# Patient Record
Sex: Female | Born: 1980
Health system: Southern US, Community
[De-identification: ages and names within clinical notes are randomized; demographics above are authoritative.]

## PROBLEM LIST (undated history)

## (undated) DIAGNOSIS — R7303 Prediabetes: Secondary | ICD-10-CM

## (undated) DIAGNOSIS — F419 Anxiety disorder, unspecified: Secondary | ICD-10-CM

## (undated) DIAGNOSIS — N939 Abnormal uterine and vaginal bleeding, unspecified: Secondary | ICD-10-CM

---

## 2002-02-17 HISTORY — PX: CHOLECYSTECTOMY: SHX55

## 2016-12-29 ENCOUNTER — Other Ambulatory Visit: Payer: Self-pay

## 2016-12-29 ENCOUNTER — Emergency Department (HOSPITAL_COMMUNITY)
Admission: EM | Admit: 2016-12-29 | Discharge: 2016-12-29 | Disposition: A | Discharge status: Home / Self Care | Payer: Self-pay | Attending: Emergency Medicine | Admitting: Emergency Medicine

## 2016-12-29 ENCOUNTER — Encounter (HOSPITAL_COMMUNITY): Payer: Self-pay | Admitting: Emergency Medicine

## 2016-12-29 DIAGNOSIS — N938 Other specified abnormal uterine and vaginal bleeding: Secondary | ICD-10-CM | POA: Insufficient documentation

## 2016-12-29 DIAGNOSIS — F17210 Nicotine dependence, cigarettes, uncomplicated: Secondary | ICD-10-CM | POA: Insufficient documentation

## 2016-12-29 LAB — URINALYSIS, ROUTINE W REFLEX MICROSCOPIC
BILIRUBIN URINE: NEGATIVE
Glucose, UA: NEGATIVE mg/dL
Hgb urine dipstick: NEGATIVE
Ketones, ur: NEGATIVE mg/dL
LEUKOCYTES UA: NEGATIVE
NITRITE: NEGATIVE
Protein, ur: NEGATIVE mg/dL
SPECIFIC GRAVITY, URINE: 1.029 (ref 1.005–1.030)
pH: 6 (ref 5.0–8.0)

## 2016-12-29 LAB — CBC WITH DIFFERENTIAL/PLATELET
Basophils Absolute: 0.1 10*3/uL (ref 0.0–0.1)
Basophils Relative: 1 %
EOS ABS: 0.2 10*3/uL (ref 0.0–0.7)
Eosinophils Relative: 2 %
HCT: 39.6 % (ref 36.0–46.0)
HEMOGLOBIN: 13.2 g/dL (ref 12.0–15.0)
LYMPHS ABS: 3 10*3/uL (ref 0.7–4.0)
LYMPHS PCT: 33 %
MCH: 30.4 pg (ref 26.0–34.0)
MCHC: 33.3 g/dL (ref 30.0–36.0)
MCV: 91.2 fL (ref 78.0–100.0)
Monocytes Absolute: 0.6 10*3/uL (ref 0.1–1.0)
Monocytes Relative: 6 %
NEUTROS ABS: 5.2 10*3/uL (ref 1.7–7.7)
NEUTROS PCT: 58 %
Platelets: 199 10*3/uL (ref 150–400)
RBC: 4.34 MIL/uL (ref 3.87–5.11)
RDW: 13.4 % (ref 11.5–15.5)
WBC: 9 10*3/uL (ref 4.0–10.5)

## 2016-12-29 LAB — WET PREP, GENITAL
CLUE CELLS WET PREP: NONE SEEN
Sperm: NONE SEEN
TRICH WET PREP: NONE SEEN
Yeast Wet Prep HPF POC: NONE SEEN

## 2016-12-29 LAB — PROTIME-INR
INR: 0.96
PROTHROMBIN TIME: 12.7 s (ref 11.4–15.2)

## 2016-12-29 LAB — POC URINE PREG, ED: Preg Test, Ur: NEGATIVE

## 2016-12-29 MED ORDER — KETOROLAC TROMETHAMINE 60 MG/2ML IM SOLN
60.0000 mg | Freq: Once | INTRAMUSCULAR | Status: AC
  Administered 2016-12-29: 60 mg via INTRAMUSCULAR
  Filled 2016-12-29: qty 2

## 2016-12-29 MED ORDER — ONDANSETRON 4 MG PO TBDP: 4.0000 mg | ORAL_TABLET | Freq: Three times a day (TID) | ORAL | 0 refills | Status: DC | PRN

## 2016-12-29 MED ORDER — HYDROCODONE-ACETAMINOPHEN 5-325 MG PO TABS
2.0000 | ORAL_TABLET | Freq: Once | ORAL | Status: AC
  Administered 2016-12-29: 2 via ORAL
  Filled 2016-12-29: qty 2

## 2016-12-29 MED ORDER — NAPROXEN 500 MG PO TABS: 500.0000 mg | ORAL_TABLET | Freq: Two times a day (BID) | ORAL | 0 refills | Status: AC | PRN

## 2016-12-29 MED ORDER — MEDROXYPROGESTERONE ACETATE 10 MG PO TABS: 10.0000 mg | ORAL_TABLET | Freq: Every day | ORAL | 0 refills | Status: DC

## 2016-12-29 NOTE — Discharge Instructions (Signed)
It is very important for you to follow-up with your OB/GYN.  I have given you number to call.  You should have a repeat ultrasound, exam, as well as Pap smear given your family history.

## 2016-12-29 NOTE — ED Triage Notes (Signed)
Pt to ER for evaluation of one month of vaginal bleeding, unsure of LMP, also reports lower back pain associated with this bleeding. VSS. A/o x4. States has taken two tests and is not pregnant. Reports miscarriage in the past

## 2016-12-29 NOTE — ED Provider Notes (Signed)
[facility] DEPART[facility] Provider Note   CSN: [id number] Arrival date & time: 12/29/16  1020     History   Chief Complaint Chief Complaint  Patient presents with  . Vaginal Bleeding  . Back Pain    HPI Mekenna Aerielle Stoklosa is a 36 y.o. female.  HPI    36 year old female with no significant past medical history here with 1 month of vaginal bleeding.  The patient states that her last normal period was several months ago.  Since then, she has had off-and-on vaginal bleeding.  She states she has had a persistent, intermittent dark red vaginal bleeding with passage of occasionally golf ball sized clots.  She has had associated cramp like, lower abdominal and back pain.  This feels similar to the contraction she had when she had her child.  She has had associated mild fatigue, but no chest pain or shortness of breath.  She is not on blood thinners.  She had a history of normal periods prior to this.  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  Her last Pap smear was reportedly several years ago after she was treated for an ectopic pregnancy.  She has not had any issues since then, but has not seen an OB.  She recently moved here from the [place], and has not set up care.   History reviewed. No pertinent past medical history.  There are no active problems to display for this patient.   History reviewed. No pertinent surgical history.  OB History    Gravida Para Term Preterm AB Living   1             SAB TAB Ectopic Multiple Live Births                   Home Medications    Prior to Admission medications   Medication Sig Start Date End Date Taking? Authorizing Provider  acetaminophen (TYLENOL) 500 MG tablet Take 500 mg every 6 (six) hours as needed by mouth for mild pain.   Yes [provider]  medroxyPROGESTERone (PROVERA) 10 MG tablet Take 1 tablet (10 mg total)  daily for 10 days by mouth. 12/29/16 01/08/17  Duffy Bruce, MD  naproxen (NAPROSYN) 500 MG tablet Take 1 tablet (500 mg total) 2 (two) times daily as needed for up to 7 days by mouth for moderate pain. 12/29/16 01/05/17  Duffy Bruce, MD  ondansetron (ZOFRAN ODT) 4 MG disintegrating tablet Take 1 tablet (4 mg total) every 8 (eight) hours as needed by mouth for nausea or vomiting. 12/29/16   Duffy Bruce, MD    Family History History reviewed. No pertinent family history.  Social History Social History   Tobacco Use  . Smoking status: Current Every Day Smoker    Packs/day: 0.50    Types: Cigarettes  . Smokeless tobacco: Never Used  Substance Use Topics  . Alcohol use: Not on file  . Drug use: Not on file     Allergies   Patient has no known allergies.   Review of Systems Review of Systems  Constitutional: Negative for fever.  Gastrointestinal: Positive for abdominal pain.  Genitourinary: Positive for pelvic pain, vaginal bleeding and vaginal pain.  All other systems reviewed and are negative.    Physical Exam Updated Vital Signs BP (!) 145/89 (BP Location: Right Arm)   Pulse (!) 112   Temp 98.5 F (36.9 C) (Oral)  Resp 20   SpO2 100%   Physical Exam  Constitutional: She is oriented to person, place, and time. She appears well-developed and well-nourished. No distress.  HENT:  Head: Normocephalic and atraumatic.  Eyes: Conjunctivae are normal.  Neck: Neck supple.  Cardiovascular: Normal rate, regular rhythm and normal heart sounds. Exam reveals no friction rub.  No murmur heard. Pulmonary/Chest: Effort normal and breath sounds normal. No respiratory distress. She has no wheezes. She has no rales.  Abdominal: Soft. She exhibits no distension. There is tenderness (Mild, suprapubic, no rebound or guarding).  Genitourinary:  Genitourinary Comments: Office is closed, with small amount of dark red venous bleeding.  There is moderate volume dark red blood in  the vaginal vault but no active or bright red bleeding.  Uterus is diffusely tender.  No adnexal pain or fullness.  Musculoskeletal: She exhibits no edema.  Neurological: She is alert and oriented to person, place, and time. She exhibits normal muscle tone.  Skin: Skin is warm. Capillary refill takes less than 2 seconds.  Psychiatric: She has a normal mood and affect.  Nursing note and vitals reviewed.    ED Treatments / Results  Labs (all labs ordered are listed, but only abnormal results are displayed) Labs Reviewed  WET PREP, GENITAL - Abnormal; Notable for the following components:      Result Value   WBC, Wet Prep HPF POC MODERATE (*)    All other components within normal limits  URINALYSIS, ROUTINE W REFLEX MICROSCOPIC - Abnormal; Notable for the following components:   APPearance HAZY (*)    All other components within normal limits  CBC WITH DIFFERENTIAL/PLATELET  PROTIME-INR  POC URINE PREG, ED  GC/CHLAMYDIA PROBE AMP ([place]) NOT AT [facility]    EKG  EKG Interpretation None       Radiology No results found.  Procedures Procedures (including critical care time)  Medications Ordered in ED Medications  ketorolac (TORADOL) injection 60 mg (60 mg Intramuscular Given 12/29/16 1303)  HYDROcodone-acetaminophen (NORCO/VICODIN) 5-325 MG per tablet 2 tablet (2 tablets Oral Given 12/29/16 1303)     Initial Impression / Assessment and Plan / ED Course  I have reviewed the triage vital signs and the nursing notes.  Pertinent labs & imaging results that were available during my care of the patient were reviewed by me and considered in my medical decision making (see chart for details).     36 year old female with past medical history as above here with persistent vaginal bleeding.  On exam, the patient's office is closed and she has no evidence of active arterial bleed.  No adnexal tenderness.  Lab work is very reassuring with normal white count and normal hemoglobin.   She does not have any symptoms to suggest PID or concomitant infection.  I suspect this is secondary to dysfunctional uterine bleeding.  She does have a family history of cervical disease, however.  I see no gross abnormalities on my visual exam today, but will start her on a trial of progesterone OCPs for dysfunctional bleeding and I have discussed the importance of following up with an OB/GYN for repeat exam, Pap smear, and ultrasound as an outpatient.  The patient is in agreement with this plan.  This note was prepared with assistance of Systems analyst. Occasional wrong-word or sound-a-like substitutions may have occurred due to the inherent limitations of voice recognition software.   Final Clinical Impressions(s) / ED Diagnoses   Final diagnoses:  DUB (dysfunctional uterine bleeding)  ED Discharge Orders        Ordered    medroxyPROGESTERone (PROVERA) 10 MG tablet  Daily     12/29/16 1410    naproxen (NAPROSYN) 500 MG tablet  2 times daily PRN     12/29/16 1410    ondansetron (ZOFRAN ODT) 4 MG disintegrating tablet  Every 8 hours PRN     12/29/16 1410       Duffy Bruce, MD 12/29/16 1420

## 2016-12-30 LAB — GC/CHLAMYDIA PROBE AMP (~~LOC~~) NOT AT ARMC
CHLAMYDIA, DNA PROBE: NEGATIVE
NEISSERIA GONORRHEA: NEGATIVE

## 2017-01-10 ENCOUNTER — Encounter (HOSPITAL_COMMUNITY): Payer: Self-pay

## 2017-01-10 ENCOUNTER — Other Ambulatory Visit: Payer: Self-pay

## 2017-01-10 ENCOUNTER — Emergency Department (HOSPITAL_COMMUNITY): Payer: Self-pay

## 2017-01-10 ENCOUNTER — Emergency Department (HOSPITAL_COMMUNITY)
Admission: EM | Admit: 2017-01-10 | Discharge: 2017-01-10 | Disposition: A | Discharge status: Home / Self Care | Payer: Self-pay | Attending: Emergency Medicine | Admitting: Emergency Medicine

## 2017-01-10 DIAGNOSIS — N938 Other specified abnormal uterine and vaginal bleeding: Secondary | ICD-10-CM | POA: Insufficient documentation

## 2017-01-10 DIAGNOSIS — F17210 Nicotine dependence, cigarettes, uncomplicated: Secondary | ICD-10-CM | POA: Insufficient documentation

## 2017-01-10 DIAGNOSIS — N939 Abnormal uterine and vaginal bleeding, unspecified: Secondary | ICD-10-CM

## 2017-01-10 DIAGNOSIS — R1030 Lower abdominal pain, unspecified: Secondary | ICD-10-CM | POA: Insufficient documentation

## 2017-01-10 LAB — CBC WITH DIFFERENTIAL/PLATELET
Basophils Absolute: 0.1 10*3/uL (ref 0.0–0.1)
Basophils Relative: 1 %
Eosinophils Absolute: 0.3 10*3/uL (ref 0.0–0.7)
Eosinophils Relative: 3 %
HCT: 39.5 % (ref 36.0–46.0)
Hemoglobin: 12.9 g/dL (ref 12.0–15.0)
Lymphocytes Relative: 31 %
Lymphs Abs: 2.4 10*3/uL (ref 0.7–4.0)
MCH: 29.9 pg (ref 26.0–34.0)
MCHC: 32.7 g/dL (ref 30.0–36.0)
MCV: 91.4 fL (ref 78.0–100.0)
Monocytes Absolute: 0.5 10*3/uL (ref 0.1–1.0)
Monocytes Relative: 6 %
Neutro Abs: 4.5 10*3/uL (ref 1.7–7.7)
Neutrophils Relative %: 59 %
Platelets: 215 10*3/uL (ref 150–400)
RBC: 4.32 MIL/uL (ref 3.87–5.11)
RDW: 13.2 % (ref 11.5–15.5)
WBC: 7.6 10*3/uL (ref 4.0–10.5)

## 2017-01-10 LAB — COMPREHENSIVE METABOLIC PANEL
ALT: 49 U/L (ref 14–54)
AST: 42 U/L — ABNORMAL HIGH (ref 15–41)
Albumin: 3.9 g/dL (ref 3.5–5.0)
Alkaline Phosphatase: 50 U/L (ref 38–126)
Anion gap: 7 (ref 5–15)
BUN: 9 mg/dL (ref 6–20)
CO2: 26 mmol/L (ref 22–32)
Calcium: 9 mg/dL (ref 8.9–10.3)
Chloride: 106 mmol/L (ref 101–111)
Creatinine, Ser: 0.74 mg/dL (ref 0.44–1.00)
GFR calc Af Amer: >60 mL/min (ref >60)
GFR calc non Af Amer: >60 mL/min (ref >60)
Glucose, Bld: 112 mg/dL — ABNORMAL HIGH (ref 65–99)
Potassium: 4.3 mmol/L (ref 3.5–5.1)
Sodium: 139 mmol/L (ref 135–145)
Total Bilirubin: 0.5 mg/dL (ref 0.3–1.2)
Total Protein: 6.7 g/dL (ref 6.5–8.1)

## 2017-01-10 LAB — I-STAT BETA HCG BLOOD, ED (MC, WL, AP ONLY): I-stat hCG, quantitative: <5 m[IU]/mL (ref <5)

## 2017-01-10 MED ORDER — IBUPROFEN 200 MG PO TABS
600.0000 mg | ORAL_TABLET | Freq: Once | ORAL | Status: AC
  Administered 2017-01-10: 12:00:00 600 mg via ORAL
  Filled 2017-01-10: qty 1

## 2017-01-10 MED ORDER — MEGESTROL ACETATE 40 MG PO TABS: 40.0000 mg | ORAL_TABLET | Freq: Every day | ORAL | 0 refills | Status: DC

## 2017-01-10 NOTE — ED Notes (Signed)
Patient transported to Ultrasound 

## 2017-01-10 NOTE — ED Triage Notes (Signed)
Patient states that she developed recurrent heavy vaginal bleeding during the night, took meds as prescribed and finished the RX 2 days ago, bleeding with cramping since middle of night

## 2017-01-10 NOTE — ED Provider Notes (Signed)
[facility] EMERGENCY DEPARTMENT Provider Note   CSN: [id number] Arrival date & time: 01/10/17  0630     History   Chief Complaint No chief complaint on file.   HPI Heidi Bartlett is a 36 y.o. female.  HPI   36 year old female presents today with complaints of vaginal bleeding.  Patient notes that she had normal menstrual cycles monthly up until several months ago.  She notes since then she has had on and off vaginal bleeding.  She notes the first major bleed started as a usual menstrual cycle but lasted approximately 2 weeks with heavy bleeding.  She notes this resolved on its own.  The next bleed started at the beginning of the months when she usually does not have her cycle.  She notes that after this episode of bleeding started she came to the emergency room.  She was evaluated in the emergency room and discharged home on progesterone.  She notes that she was taking the progesterone which seemed to improve the bleeding but did not completely stop it.  She notes 2 days ago was her last dose of this and woke up in the middle the night last night with severe bleeding.  She notes she is using one tampon every 10 minutes.  She notes that she did not reach out to OB/GYN as she does not feel that they will take her because she does not have insurance and has out-of-state Medicaid.  Patient reports that she has been sexually active since her last visit here to the ED.  She reports bilateral lower abdominal cramping.  She denies any fever.  She denies any vaginal discharge.      History reviewed. No pertinent past medical history.  There are no active problems to display for this patient.   History reviewed. No pertinent surgical history.  OB History    Gravida Para Term Preterm AB Living   1             SAB TAB Ectopic Multiple Live Births                   Home Medications    Prior to Admission medications   Medication Sig Start Date End Date Taking?  Authorizing Provider  acetaminophen (TYLENOL) 500 MG tablet Take 500 mg every 6 (six) hours as needed by mouth for mild pain.   Yes [provider]  medroxyPROGESTERone (PROVERA) 10 MG tablet Take 1 tablet (10 mg total) daily for 10 days by mouth. 12/29/16 01/10/17 Yes Duffy Bruce, MD  naproxen (NAPROSYN) 500 MG tablet Take 500 mg by mouth 2 (two) times daily with a meal.   Yes [provider]  megestrol (MEGACE) 40 MG tablet Take 1 tablet (40 mg total) by mouth daily. Please take tab one three times per day for three days, two times daily for 3 days, and 1 tab daily for 10 days 01/10/17   Masoud Nyce, Dellis Filbert, PA-C  ondansetron (ZOFRAN ODT) 4 MG disintegrating tablet Take 1 tablet (4 mg total) every 8 (eight) hours as needed by mouth for nausea or vomiting. Patient not taking: Reported on 01/10/2017 12/29/16   Duffy Bruce, MD    Family History No family history on file.  Social History Social History   Tobacco Use  . Smoking status: Current Every Day Smoker    Packs/day: 0.50    Types: Cigarettes  . Smokeless tobacco: Never Used  Substance Use Topics  . Alcohol use: Not on file  .  Drug use: Not on file     Allergies   Patient has no known allergies.   Review of Systems Review of Systems  All other systems reviewed and are negative.    Physical Exam Updated Vital Signs BP 136/83   Pulse 71   Temp 98.6 F (37 C) (Oral)   Resp 16   SpO2 98%   Breastfeeding? Unknown   Physical Exam  Constitutional: She is oriented to person, place, and time. She appears well-developed and well-nourished.  HENT:  Head: Normocephalic and atraumatic.  Eyes: Conjunctivae are normal. Pupils are equal, round, and reactive to light. Right eye exhibits no discharge. Left eye exhibits no discharge. No scleral icterus.  Neck: Normal range of motion. No JVD present. No tracheal deviation present.  Pulmonary/Chest: Effort normal. No stridor.  Abdominal:  Minor tenderness to  bilateral lower regions-mid and upper abdomen nontender to palpation  Genitourinary:  Genitourinary Comments: Copious dark blood and small clots in vaginal vault - cervical os closed without abnormality - no obvious discharge   Neurological: She is alert and oriented to person, place, and time. Coordination normal.  Psychiatric: She has a normal mood and affect. Her behavior is normal. Judgment and thought content normal.  Nursing note and vitals reviewed.    ED Treatments / Results  Labs (all labs ordered are listed, but only abnormal results are displayed) Labs Reviewed  COMPREHENSIVE METABOLIC PANEL - Abnormal; Notable for the following components:      Result Value   Glucose, Bld 112 (*)    AST 42 (*)    All other components within normal limits  CBC WITH DIFFERENTIAL/PLATELET  I-STAT BETA HCG BLOOD, ED ([facility], [facility], [facility] ONLY)    EKG  EKG Interpretation None       Radiology US Pelvic Complete With Transvaginal  Result Date: 01/10/2017 CLINICAL DATA:  Recurrent heavy vaginal bleeding. EXAM: TRANSABDOMINAL AND TRANSVAGINAL ULTRASOUND OF PELVIS TECHNIQUE: Both transabdominal and transvaginal ultrasound examinations of the pelvis were performed. Transabdominal technique was performed for global imaging of the pelvis including uterus, ovaries, adnexal regions, and pelvic cul-de-sac. It was necessary to proceed with endovaginal exam following the transabdominal exam to visualize the bilateral ovaries. COMPARISON:  None FINDINGS: Uterus Measurements: 8.8 x 4.4 x 4.5 cm. Multiple small nabothian cysts. Uterus is heterogeneous without discrete fibroids. Endometrium Thickness: 0.9 cm.  No focal abnormality visualized. Right ovary Measurements: 5.4 x 2.7 x 4.1 cm. Anechoic cyst in the right ovary that measures 3.5 x 2.7 x 3.3 cm. Left ovary Measurements: 4.9 x 3.0 x 4.4 cm. Anechoic cyst in left ovary measuring 4.6 x 2.0 x 3.5 cm. There is an additional small cyst or follicle measuring up to 2.0  cm. Other findings Trace free fluid. IMPRESSION: Uterus is heterogeneous without discrete fibroids. Bilateral ovarian cysts. Electronically Signed   By: Markus Daft M.D.   On: 01/10/2017 14:20    Procedures Procedures (including critical care time)  Medications Ordered in ED Medications  ibuprofen (ADVIL,MOTRIN) tablet 600 mg (600 mg Oral Given 01/10/17 1158)     Initial Impression / Assessment and Plan / ED Course  I have reviewed the triage vital signs and the nursing notes.  Pertinent labs & imaging results that were available during my care of the patient were reviewed by me and considered in my medical decision making (see chart for details).       Final Clinical Impressions(s) / ED Diagnoses   Final diagnoses:  Vaginal bleeding  Dysfunctional uterine bleeding  Labs: I stat beta hcg, cbc, cmp  Imaging: OB ultrasound,   Consults:  Therapeutics: Ibuprofen  Discharge Meds: megace  Assessment/Plan:   36 year old female presents today with dysfunctional uterine bleeding.  This is her second visit for the same complaint.  Patient had an ultrasound here that showed no significant findings.  Physical exam shows bleeding with no other abnormality.  Patient will be started on Megace, she will follow-up with OB/GYN, I have given her follow-up information for the [facility].  She will return for any new or worsening signs or symptoms.  Patient's workup here is reassuring, she has no signs of significant blood loss on her CBC, no signs of tachycardia or unstable vital signs.  Patient given strict return precautions, verbalized understanding and agreement to today's    ED Discharge Orders        Ordered    megestrol (MEGACE) 40 MG tablet  Daily     01/10/17 1519       Okey Regal, PA-C 01/10/17 1643    Virgel Manifold, MD 01/11/17 1140

## 2017-01-10 NOTE — ED Notes (Addendum)
Patient returned from Ultrasound. 

## 2017-01-10 NOTE — Discharge Instructions (Signed)
Please read attached information. If you experience any new or worsening signs or symptoms please return to the emergency room for evaluation. Please follow-up with your primary care provider or specialist as discussed. Please use medication prescribed only as directed and discontinue taking if you have any concerning signs or symptoms.   °

## 2017-11-02 ENCOUNTER — Ambulatory Visit: Payer: Self-pay | Admitting: Internal Medicine

## 2017-11-24 ENCOUNTER — Ambulatory Visit (HOSPITAL_COMMUNITY)
Admission: RE | Admit: 2017-11-24 | Discharge: 2017-11-24 | Disposition: A | Discharge status: Home / Self Care | Payer: Self-pay | Source: Ambulatory Visit | Attending: Internal Medicine | Admitting: Internal Medicine

## 2017-11-24 ENCOUNTER — Ambulatory Visit: Payer: Self-pay | Attending: Internal Medicine | Admitting: Internal Medicine

## 2017-11-24 ENCOUNTER — Encounter: Payer: Self-pay | Admitting: Internal Medicine

## 2017-11-24 VITALS — BP 123/81 | HR 78 | Temp 99.3°F | Resp 16 | Ht 66.5 in | Wt 247.2 lb

## 2017-11-24 DIAGNOSIS — E6609 Other obesity due to excess calories: Secondary | ICD-10-CM | POA: Insufficient documentation

## 2017-11-24 DIAGNOSIS — Z9049 Acquired absence of other specified parts of digestive tract: Secondary | ICD-10-CM | POA: Insufficient documentation

## 2017-11-24 DIAGNOSIS — Z79899 Other long term (current) drug therapy: Secondary | ICD-10-CM | POA: Insufficient documentation

## 2017-11-24 DIAGNOSIS — M5431 Sciatica, right side: Secondary | ICD-10-CM | POA: Insufficient documentation

## 2017-11-24 DIAGNOSIS — Z6839 Body mass index (BMI) 39.0-39.9, adult: Secondary | ICD-10-CM | POA: Insufficient documentation

## 2017-11-24 DIAGNOSIS — F17200 Nicotine dependence, unspecified, uncomplicated: Secondary | ICD-10-CM | POA: Insufficient documentation

## 2017-11-24 DIAGNOSIS — F17210 Nicotine dependence, cigarettes, uncomplicated: Secondary | ICD-10-CM | POA: Insufficient documentation

## 2017-11-24 DIAGNOSIS — J069 Acute upper respiratory infection, unspecified: Secondary | ICD-10-CM | POA: Insufficient documentation

## 2017-11-24 MED ORDER — TETANUS-DIPHTH-ACELL PERTUSSIS 5-2.5-18.5 LF-MCG/0.5 IM SUSP: 0.5000 mL | Freq: Once | INTRAMUSCULAR | 0 refills | Status: AC

## 2017-11-24 MED ORDER — NAPROXEN 500 MG PO TABS: 500.0000 mg | ORAL_TABLET | Freq: Two times a day (BID) | ORAL | 1 refills | Status: DC | PRN

## 2017-11-24 MED ORDER — METHOCARBAMOL 500 MG PO TABS: 500.0000 mg | ORAL_TABLET | Freq: Two times a day (BID) | ORAL | 0 refills | Status: DC | PRN

## 2017-11-24 MED FILL — METHOCARBAMOL 500 MG TABS: 500 | 15 days supply | Qty: 30 | Fill #0

## 2017-11-24 MED FILL — NAPROXEN 500 MG TABLET: 500 | 30 days supply | Qty: 60 | Fill #0
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
## 2017-11-24 NOTE — Progress Notes (Signed)
Pt states her back usually bothers her at night  Pt states the pain goes down both hips

## 2017-11-24 NOTE — Patient Instructions (Addendum)
Follow a Healthy Eating Plan - You can do it! Limit sugary drinks.  Avoid sodas, sweet tea, sport or energy drinks, or fruit drinks.  Drink water, lo-fat milk, or diet drinks. Limit snack foods.   Cut back on candy, cake, cookies, chips, ice cream.  These are a special treat, only in small amounts. Eat plenty of vegetables.  Especially dark green, red, and orange vegetables. Aim for at least 3 servings a day. More is better! Include fruit in your daily diet.  Whole fruit is much healthier than fruit juice! Limit "white" bread, "white" pasta, "white" rice.   Choose "100% whole grain" products, brown or wild rice. Avoid fatty meats. Try "Meatless Monday" and choose eggs or beans one day a week.  When eating meat, choose lean meats like chicken, [place], and fish.  Grill, broil, or bake meats instead of frying, and eat poultry without the skin. Eat less salt.  Avoid frozen pizzas, frozen dinners and salty foods.  Use seasonings other than salt in cooking.  This can help blood pressure and keep you from swelling Beer, wine and liquor have calories.  If you can safely drink alcohol, limit to 1 drink per day for women, 2 drinks for men   Td Vaccine (Tetanus and Diphtheria): What You Need to Know 1. Why get vaccinated? Tetanus  and diphtheria are very serious diseases. They are rare in the [place] today, but people who do become infected often have severe complications. Td vaccine is used to protect adolescents and adults from both of these diseases. Both tetanus and diphtheria are infections caused by bacteria. Diphtheria spreads from person to person through coughing or sneezing. Tetanus-causing bacteria enter the body through cuts, scratches, or wounds. TETANUS (lockjaw) causes painful muscle tightening and stiffness, usually all over the body.  It can lead to tightening of muscles in the head and neck so you can't open your mouth, swallow, or sometimes even breathe. Tetanus kills about 1 out of  every 10 people who are infected even after receiving the best medical care.  DIPHTHERIA can cause a thick coating to form in the back of the throat.  It can lead to breathing problems, paralysis, heart failure, and death.  Before vaccines, as many as 200,000 cases of diphtheria and hundreds of cases of tetanus were reported in the [place] each year. Since vaccination began, reports of cases for both diseases have dropped by about 99%. 2. Td vaccine Td vaccine can protect adolescents and adults from tetanus and diphtheria. Td is usually given as a booster dose every 10 years but it can also be given earlier after a severe and dirty wound or burn. Another vaccine, called Tdap, which protects against pertussis in addition to tetanus and diphtheria, is sometimes recommended instead of Td vaccine. Your doctor or the person giving you the vaccine can give you more information. Td may safely be given at the same time as other vaccines. 3. Some people should not get this vaccine  A person who has ever had a life-threatening allergic reaction after a previous dose of any tetanus or diphtheria containing vaccine, OR has a severe allergy to any part of this vaccine, should not get Td vaccine. Tell the person giving the vaccine about any severe allergies.  Talk to your doctor if you: ? had severe pain or swelling after any vaccine containing diphtheria or tetanus, ? ever had a condition called Guillain Barre Syndrome (GBS), ? aren't feeling well on the day the shot  is scheduled. 4. What are the risks from Td vaccine? With any medicine, including vaccines, there is a chance of side effects. These are usually mild and go away on their own. Serious reactions are also possible but are rare. Most people who get Td vaccine do not have any problems with it. Mild problems following Td vaccine: (Did not interfere with activities)  Pain where the shot was given (about 8 people in 10)  Redness or  swelling where the shot was given (about 1 person in 4)  Mild fever (rare)  Headache (about 1 person in 4)  Tiredness (about 1 person in 4)  Moderate problems following Td vaccine: (Interfered with activities, but did not require medical attention)  Fever over 102F (rare)  Severe problems following Td vaccine: (Unable to perform usual activities; required medical attention)  Swelling, severe pain, bleeding and/or redness in the arm where the shot was given (rare).  Problems that could happen after any vaccine:  People sometimes faint after a medical procedure, including vaccination. Sitting or lying down for about 15 minutes can help prevent fainting, and injuries caused by a fall. Tell your doctor if you feel dizzy, or have vision changes or ringing in the ears.  Some people get severe pain in the shoulder and have difficulty moving the arm where a shot was given. This happens very rarely.  Any medication can cause a severe allergic reaction. Such reactions from a vaccine are very rare, estimated at fewer than 1 in a million doses, and would happen within a few minutes to a few hours after the vaccination. As with any medicine, there is a very remote chance of a vaccine causing a serious injury or death. The safety of vaccines is always being monitored. For more information, visit: http://www.aguilar.org/ 5. What if there is a serious reaction? What should I look for? Look for anything that concerns you, such as signs of a severe allergic reaction, very high fever, or unusual behavior. Signs of a severe allergic reaction can include hives, swelling of the face and throat, difficulty breathing, a fast heartbeat, dizziness, and weakness. These would usually start a few minutes to a few hours after the vaccination. What should I do?  If you think it is a severe allergic reaction or other emergency that can't wait, call 9-1-1 or get the person to the nearest hospital. Otherwise,  call your doctor.  Afterward, the reaction should be reported to the Vaccine Adverse Event Reporting System (VAERS). Your doctor might file this report, or you can do it yourself through the VAERS web site at www.vaers.SamedayNews.es, or by calling 708-269-4235. ? VAERS does not give medical advice. 6. The National Vaccine Injury Compensation Program The [employer] Vaccine Injury Compensation Program (VICP) is a federal program that was created to compensate people who may have been injured by certain vaccines. Persons who believe they may have been injured by a vaccine can learn about the program and about filing a claim by calling (336)213-9133 or visiting the [place] website at GoldCloset.com.ee. There is a time limit to file a claim for compensation. 7. How can I learn more?  Ask your doctor. He or she can give you the vaccine package insert or suggest other sources of information.  Call your local or state health department.  Contact the Centers for Disease Control and Prevention (CDC): ? Call 314-621-5723 (1-800-CDC-INFO) ? Visit CDC's website at http://hunter.com/ CDC Td Vaccine VIS (05/29/15) This information is not intended to replace advice given to you  by your health care provider. Make sure you discuss any questions you have with your health care provider. Document Released: 12/01/2005 Document Revised: 10/25/2015 Document Reviewed: 10/25/2015 Elsevier Interactive Patient Education  2017 [employer].

## 2017-11-25 ENCOUNTER — Telehealth: Payer: Self-pay

## 2017-11-25 NOTE — Telephone Encounter (Signed)
Contacted pt to go over xray results pt is aware and doesn't have any questions or concerns  

## 2017-12-08 ENCOUNTER — Ambulatory Visit: Payer: Self-pay | Attending: Internal Medicine

## 2017-12-22 ENCOUNTER — Telehealth: Payer: Self-pay | Admitting: Internal Medicine

## 2017-12-22 DIAGNOSIS — M5431 Sciatica, right side: Secondary | ICD-10-CM

## 2017-12-22 NOTE — Telephone Encounter (Signed)
Patient has cafa

## 2017-12-22 NOTE — Telephone Encounter (Signed)
Patient has CAFA

## 2017-12-22 NOTE — Telephone Encounter (Signed)
Patient needs a referral for a back doctor please fu with the patient

## 2018-01-12 ENCOUNTER — Encounter (INDEPENDENT_AMBULATORY_CARE_PROVIDER_SITE_OTHER): Payer: Self-pay | Admitting: Orthopaedic Surgery

## 2018-01-12 ENCOUNTER — Ambulatory Visit (INDEPENDENT_AMBULATORY_CARE_PROVIDER_SITE_OTHER): Payer: Self-pay

## 2018-01-12 ENCOUNTER — Ambulatory Visit (INDEPENDENT_AMBULATORY_CARE_PROVIDER_SITE_OTHER): Payer: Self-pay | Admitting: Orthopaedic Surgery

## 2018-01-12 VITALS — BP 129/84 | HR 96 | Ht 67.0 in | Wt 247.0 lb

## 2018-01-12 DIAGNOSIS — M545 Low back pain, unspecified: Secondary | ICD-10-CM

## 2018-01-12 DIAGNOSIS — G8929 Other chronic pain: Secondary | ICD-10-CM

## 2018-01-12 NOTE — Progress Notes (Signed)
Office Visit Note/orthopedic consultation requested by Dr. Wynetta Emery   Patient: Heidi Bartlett           Date of Birth: 03-Feb-1981           MRN: [id number] Visit Date: 01/12/2018              Requested by: Ladell Pier, MD [address] [place], [place] [address] PCP: Ladell Pier, MD   Assessment & Plan: Visit Diagnoses:  1. Chronic bilateral low back pain, unspecified whether sciatica present     Plan: We discussed core strengthening working on weight loss.  She is gained more than 90 pounds since she moved to [place] and should get improvement in her back symptoms with weight loss and core strengthening.  If she is having persistent problems after this she can return.  We discussed that at some point she might require instrumented fusion for stabilization.  Thank you the opportunity to see her in consultation  Follow-Up Instructions: Return if symptoms worsen or fail to improve.   Orders:  Orders Placed This Encounter  Procedures  . XR Lumb Spine Flex&Ext Only   No orders of the defined types were placed in this encounter.     Procedures: No procedures performed   Clinical Data: No additional findings.   Subjective: Chief Complaint  Patient presents with  . Lower Back - Pain    HPI Orthopedic Consultation requested by Dr. Wynetta Emery.  37 year old female who is here with an advocate ,states she is had chronic low back pain for years.  She has pain in her back that radiates to both hips and has off-and-on numbness in the lateral right calf.  Pain tends to be worse at night.  She had some Robaxin that gave some relief she did not get relief with Naprosyn.  She is noticed some constipation with Naprosyn 500 mg twice daily.  No associated bowel or bladder symptoms with her back pain.  She states she is to weigh 155 and over time since she moved from [place] she is gained 95 pounds.  X-rays obtained on Review of Systems point review of systems positive for  tobacco dependence cigarette smoker, 2 diabetes BMI 38.6.   Objective: Vital Signs: BP 129/84   Pulse 96   Ht 5\' 7"  (1.702 m)   Wt 247 lb (112 kg)   BMI 38.69 kg/m   Physical Exam  Constitutional: She is oriented to person, place, and time. She appears well-developed.  HENT:  Head: Normocephalic.  Right Ear: External ear normal.  Left Ear: External ear normal.  Eyes: Pupils are equal, round, and reactive to light.  Neck: No tracheal deviation present. No thyromegaly present.  Cardiovascular: Normal rate.  Pulmonary/Chest: Effort normal.  Abdominal: Soft.  Neurological: She is alert and oriented to person, place, and time.  Skin: Skin is warm and dry.  Psychiatric: She has a normal mood and affect. Her behavior is normal.    Ortho Exam patient not able to do a sit up.  She has intact anterior tib gastrocsoleus able heel and toe walk reflexes are 2+ and symmetrical no isolated motor weakness no atrophy.  She has some sciatic notch tenderness negative logroll to the hips.  SI joint testing is negative.  Specialty Comments:  No specialty comments available.  Imaging: CLINICAL DATA:  Chronic sciatica  EXAM: LUMBAR SPINE - COMPLETE 4+ VIEW  COMPARISON:  None.  FINDINGS: Frontal, lateral, spot lumbosacral lateral, and bilateral oblique views were obtained.  There are 5 non-rib-bearing lumbar type vertebral bodies. There is no evident fracture. There is 12 mm of anterolisthesis of L5 on S1 with apparent pars defects at L5 bilaterally. No other spondylolisthesis is evident. There is severe disc space narrowing at L4-5 and L5-S1. Other disc spaces appear unremarkable. There is facet osteoarthritic change at L5-S1 bilaterally.  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 Severe disc space narrowing noted at L4-5 and L5-S1. Other disc spaces appear normal. No fracture. Facet arthropathy noted at L5-S1  bilaterally.   Electronically Signed   By: Lowella Grip III M.D.   On: 11/24/2017 15:33   PMFS History: Patient Active Problem List   Diagnosis Date Noted  . Sciatica of right side 11/24/2017  . Class 2 obesity due to excess calories without serious comorbidity with body mass index (BMI) of 39.0 to 39.9 in adult 11/24/2017  . Tobacco dependence 11/24/2017   No past medical history on file.  No family history on file.  Past Surgical History:  Procedure Laterality Date  . CESAREAN SECTION  2004  . CHOLECYSTECTOMY  2004   Social History   Occupational History  . Occupation: unemployed  Tobacco Use  . Smoking status: Current Every Day Smoker    Packs/day: 0.50    Types: Cigarettes  . Smokeless tobacco: Never Used  Substance and Sexual Activity  . Alcohol use: Not Currently  . Drug use: Not Currently  . Sexual activity: Not on file

## 2018-06-22 ENCOUNTER — Telehealth: Payer: Self-pay

## 2018-06-22 NOTE — Telephone Encounter (Signed)
Message received from [facility], RN/[employer] requesting follow up appointment for patient at [facility]. Informed her that appointment has been scheduled for 07/02/2018 @ 1410. Provider to determine if in person or televisit.

## 2018-07-02 ENCOUNTER — Ambulatory Visit: Payer: Self-pay | Attending: Internal Medicine | Admitting: Internal Medicine

## 2018-07-02 ENCOUNTER — Other Ambulatory Visit: Payer: Self-pay

## 2018-07-02 DIAGNOSIS — M545 Low back pain, unspecified: Secondary | ICD-10-CM

## 2018-07-02 DIAGNOSIS — N938 Other specified abnormal uterine and vaginal bleeding: Secondary | ICD-10-CM

## 2018-07-02 DIAGNOSIS — E669 Obesity, unspecified: Secondary | ICD-10-CM

## 2018-07-02 DIAGNOSIS — G8929 Other chronic pain: Secondary | ICD-10-CM

## 2018-07-02 NOTE — Progress Notes (Signed)
Virtual Visit via Telephone Note Due to current restrictions/limitations of in-office visits due to the COVID-19 pandemic, this scheduled clinical appointment was converted to a telehealth visit  I connected with Heidi Bartlett on 07/02/18 at 1:55 p.m by telephone and verified that I am speaking with the correct person using two identifiers. I am in my office.  The patient is at home.  Only the patient and myself participated in this encounter.  I discussed the limitations, risks, security and privacy concerns of performing an evaluation and management service by telephone and the availability of in person appointments. I also discussed with the patient that there may be a patient responsible charge related to this service. The patient expressed understanding and agreed to proceed.   History of Present Illness: History of obesity, tobacco dependence, chronic right-sided lower back pain.  Last seen in 11/2018  Loss 47 lbs since last seen 11/2018 Walking more; stopped drinking sodas and Monster drinks.  Drinking more water.  She has cut back significantly on eating bread and junk foods -However she reports that despite the weight loss she still gets her lower back pain and right hip pain.  She had seen the orthopedics Dr. Lorin Mercy back in November and he stressed the importance of weight loss.  He told her that at some point she may require instrumented fusion for stabilization of her lumbar spine.  Pt c/o episode of prolong vaginal bleeding this past mth. Started on time 05/19/2018 and bled the entire mth.  Bleeding moderate to heavy with clots.  + cramps. Did a home preg test that was neg.   Stopped 1.5 wks ago. Had similar episode in 2018.  Did not see a gyn. Seen in ED and had pelvic US that was negative for fibroids -not on any method of BC  -last pap was over 5 yrs ago    Observations/Objective: No direct observation done as this was a telephone encounter  Assessment and Plan: 1.  Dysfunctional uterine bleeding -We will have patient come in for pelvic exam and Pap smear within the next 2 to 3 weeks.  Will check hormone levels including TSH on that visit.  Until then I have asked her to keep a calendar if she has any further bleeding.  2. Obesity (BMI 30-39.9) Commended her on weight loss.  Encouraged her to keep up the good work with healthy eating and regular exercise.  3. Chronic right-sided low back pain, unspecified whether sciatica present Hopefully this will start to get better with continued weight loss.   Follow Up Instructions: 2-3 wks in person   I discussed the assessment and treatment plan with the patient. The patient was provided an opportunity to ask questions and all were answered. The patient agreed with the plan and demonstrated an understanding of the instructions.   The patient was advised to call back or seek an in-person evaluation if the symptoms worsen or if the condition fails to improve as anticipated.  I provided 15 minutes of non-face-to-face time during this encounter.   Karle Plumber, MD

## 2018-07-15 ENCOUNTER — Telehealth: Payer: Self-pay

## 2018-07-15 NOTE — Telephone Encounter (Signed)
Message received from [facility], [place]. She said that the patient is inquiring about the status of a referral to OB/GYN. Informed her that PCP would be notified.

## 2018-07-16 NOTE — Telephone Encounter (Signed)
Message sent to [facility], RN [place] her that patient has been scheduled for an appointment with Dr Wynetta Emery 07/22/2018 @ 1510 and requesting Dawn inform the patient that she will be contacted the day prior to the appointment to confirm

## 2018-07-22 ENCOUNTER — Ambulatory Visit: Payer: Self-pay | Admitting: Internal Medicine

## 2018-07-27 ENCOUNTER — Ambulatory Visit: Payer: Self-pay | Admitting: Internal Medicine

## 2018-08-02 ENCOUNTER — Ambulatory Visit: Payer: Self-pay | Admitting: Internal Medicine

## 2018-11-25 ENCOUNTER — Other Ambulatory Visit: Payer: Self-pay

## 2018-11-25 DIAGNOSIS — Z20822 Contact with and (suspected) exposure to covid-19: Secondary | ICD-10-CM

## 2018-11-26 LAB — NOVEL CORONAVIRUS, NAA: SARS-CoV-2, NAA: NOT DETECTED

## 2019-05-03 ENCOUNTER — Ambulatory Visit: Payer: Self-pay | Admitting: Family Medicine

## 2019-05-03 ENCOUNTER — Other Ambulatory Visit: Payer: Self-pay

## 2019-05-03 ENCOUNTER — Other Ambulatory Visit (HOSPITAL_COMMUNITY)
Admission: RE | Admit: 2019-05-03 | Discharge: 2019-05-03 | Disposition: A | Discharge status: Home / Self Care | Payer: Self-pay | Source: Ambulatory Visit | Attending: Family Medicine | Admitting: Family Medicine

## 2019-05-03 VITALS — BP 123/80 | HR 88 | Temp 98.2°F | Resp 18 | Ht 64.0 in

## 2019-05-03 DIAGNOSIS — M5441 Lumbago with sciatica, right side: Secondary | ICD-10-CM

## 2019-05-03 DIAGNOSIS — Z114 Encounter for screening for human immunodeficiency virus [HIV]: Secondary | ICD-10-CM

## 2019-05-03 DIAGNOSIS — M5442 Lumbago with sciatica, left side: Secondary | ICD-10-CM

## 2019-05-03 DIAGNOSIS — Z124 Encounter for screening for malignant neoplasm of cervix: Secondary | ICD-10-CM

## 2019-05-03 DIAGNOSIS — G8929 Other chronic pain: Secondary | ICD-10-CM

## 2019-05-03 DIAGNOSIS — F17200 Nicotine dependence, unspecified, uncomplicated: Secondary | ICD-10-CM

## 2019-05-03 DIAGNOSIS — Z01419 Encounter for gynecological examination (general) (routine) without abnormal findings: Secondary | ICD-10-CM

## 2019-05-03 DIAGNOSIS — N938 Other specified abnormal uterine and vaginal bleeding: Secondary | ICD-10-CM

## 2019-05-03 LAB — POCT URINE PREGNANCY: Preg Test, Ur: NEGATIVE

## 2019-05-03 MED ORDER — MELOXICAM 15 MG PO TABS: 7.5000 mg | ORAL_TABLET | Freq: Every day | ORAL | 2 refills | Status: DC | PRN

## 2019-05-03 MED ORDER — CYCLOBENZAPRINE HCL 10 MG PO TABS: 10.0000 mg | ORAL_TABLET | Freq: Two times a day (BID) | ORAL | 0 refills | Status: DC | PRN

## 2019-05-03 NOTE — Patient Instructions (Addendum)
You will be notified of all labs results via [place] within 5-7 business days.  We are scheduling you a follow-up at one our clinics with one of our providers and and an appointment with financial counselor in order for you to apply for financial assistance and resume.  Take meloxicam and cyclobenzaprine as prescribed. I have placed your referral to OBGYN in order to secure you an appointment as it can take several weeks before they are able to schedule you. Please inform the scheduler that you are applying for financial assistance.      Dysfunctional Uterine Bleeding Dysfunctional uterine bleeding is abnormal bleeding from the uterus. Dysfunctional uterine bleeding includes:  A menstrual period that comes earlier or later than usual.  A menstrual period that is lighter or heavier than usual, or has large blood clots.  Vaginal bleeding between menstrual periods.  Skipping one or more menstrual periods.  Vaginal bleeding after sex.  Vaginal bleeding after menopause. Follow these instructions at home: Eating and drinking   Eat well-balanced meals. Include foods that are high in iron, such as liver, meat, shellfish, green leafy vegetables, and eggs.  To prevent or treat constipation, your health care provider may recommend that you: ? Drink enough fluid to keep your urine pale yellow. ? Take over-the-counter or prescription medicines. ? Eat foods that are high in fiber, such as beans, whole grains, and fresh fruits and vegetables. ? Limit foods that are high in fat and processed sugars, such as fried or sweet foods. Medicines  Take over-the-counter and prescription medicines only as told by your health care provider.  Do not change medicines without talking with your health care provider.  Aspirin or medicines that contain aspirin may make the bleeding worse. Do not take those medicines: ? During the week before your menstrual period. ? During your menstrual period.  If  you were prescribed iron pills, take them as told by your health care provider. Iron pills help to replace iron that your body loses because of this condition. Activity  If you need to change your sanitary pad or tampon more than one time every 2 hours: ? Lie in bed with your feet raised (elevated). ? Place a cold pack on your lower abdomen. ? Rest as much as possible until the bleeding stops or slows down.  Do not try to lose weight until the bleeding has stopped and your blood iron level is back to normal. General instructions   For two months, write down: ? When your menstrual period starts. ? When your menstrual period ends. ? When any abnormal vaginal bleeding occurs. ? What problems you notice.  Keep all follow up visits as told by your health care provider. This is important. Contact a health care provider if you:  Feel light-headed or weak.  Have nausea and vomiting.  Cannot eat or drink without vomiting.  Feel dizzy or have diarrhea while you are taking medicines.  Are taking birth control pills or hormones, and you want to change them or stop taking them. Get help right away if:  You develop a fever or chills.  You need to change your sanitary pad or tampon more than one time per hour.  Your vaginal bleeding becomes heavier, or your flow contains clots more often.  You develop pain in your abdomen.  You lose consciousness.  You develop a rash. Summary  Dysfunctional uterine bleeding is abnormal bleeding from the uterus.  It includes menstrual bleeding of abnormal duration, volume, or regularity.  Bleeding after sex and after menopause are also considered dysfunctional uterine bleeding. This information is not intended to replace advice given to you by your health care provider. Make sure you discuss any questions you have with your health care provider. Document Revised: 07/15/2017 Document Reviewed: 07/15/2017 Elsevier Patient Education  [place].

## 2019-05-03 NOTE — Progress Notes (Signed)
Patient ID: Heidi Bartlett, female    DOB: 05/24/1980, 39 y.o.   MRN: [id number]  PCP: Ladell Pier, MD  Chief Complaint  Patient presents with  . Gynecologic Exam  . Back Pain    Subjective:  HPI  Heidi Bartlett is a 39 y.o. female , current everyday smoker, presents for PAP , evaluation of chronic back pain, and evaluation of DUB.  GYN Encounter Overdue for PAP. Patient endorses no prior gynecological follow-up over a time span of several years. Unknown if any prior history of abnormal PAP. Reports mother advised her of a dx of endometriosis. Had a documented history of DUB which has been treated at ER and or UC. No referral for evaluation with OB-GYN. 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  Last prolonged episode of bleeding occurred over 4-5 months ago and she bleed consistently for a course of 90 days and did not see medical attention at that time. She has experienced two normal menstrual periods and endorses it is time for her cycle to begin at anytime. Denies concerns for STD. She is sexually active although with same longtime partner.  Social History   Socioeconomic History  . Marital status: Married    Spouse name: Not on file  . Number of children: 0  . Years of education: 9th grade  . Highest education level: Not on file  Occupational History  . Occupation: unemployed  Tobacco Use  . Smoking status: Current Every Day Smoker    Packs/day: 0.50    Types: Cigarettes  . Smokeless tobacco: Never Used  Substance and Sexual Activity  . Alcohol use: Not Currently  . Drug use: Not Currently  . Sexual activity: Not on file  Other Topics Concern  . Not on file  Social History Narrative  . Not on file   Social Determinants of Health   Financial Resource Strain:   . Difficulty of Paying Living Expenses:   Food Insecurity:   . Worried About Charity fundraiser in the Last Year:   .  Arboriculturist in the Last Year:   Transportation Needs:   . Film/video editor (Medical):   Marland Kitchen Lack of Transportation (Non-Medical):   Physical Activity:   . Days of Exercise per Week:   . Minutes of Exercise per Session:   Stress:   . Feeling of Stress :   Social Connections:   . Frequency of Communication with Friends and Family:   . Frequency of Social Gatherings with Friends and Family:   . Attends Religious Services:   . Active Member of Clubs or Organizations:   . Attends Archivist Meetings:   Marland Kitchen Marital Status:   Intimate Partner Violence:   . Fear of Current or Ex-Partner:   . Emotionally Abused:   Marland Kitchen Physically Abused:   . Sexually Abused:     No family history on file.   Review of Systems Pertinent negatives listed in HPI Patient Active Problem List   Diagnosis Date Noted  . Dysfunctional uterine bleeding 07/02/2018  . Sciatica of right side 11/24/2017  . Class 2 obesity due to excess calories without serious comorbidity with body mass index (BMI) of 39.0 to 39.9 in adult 11/24/2017  . Tobacco dependence 11/24/2017    Allergies  Allergen Reactions  . Codeine Hives    Prior to Admission medications   Medication Sig Start Date End Date Taking? Authorizing Provider  acetaminophen (TYLENOL) 500 MG tablet Take 500 mg every 6 (six) hours as needed by mouth for mild pain.    [provider]  medroxyPROGESTERone (PROVERA) 10 MG tablet Take 1 tablet (10 mg total) daily for 10 days by mouth. 12/29/16 01/10/17  Duffy Bruce, MD  megestrol (MEGACE) 40 MG tablet Take 1 tablet (40 mg total) by mouth daily. Please take tab one three times per day for three days, two times daily for 3 days, and 1 tab daily for 10 days Patient not taking: Reported on 11/24/2017 01/10/17   Hedges, Dellis Filbert, PA-C  methocarbamol (ROBAXIN) 500 MG tablet Take 1 tablet (500 mg total) by mouth 2 (two) times daily as needed for muscle spasms. Patient not taking: Reported on  07/02/2018 11/24/17   Ladell Pier, MD  naproxen (NAPROSYN) 500 MG tablet Take 500 mg by mouth 2 (two) times daily with a meal.    [provider]  naproxen (NAPROSYN) 500 MG tablet Take 1 tablet (500 mg total) by mouth 2 (two) times daily as needed. Patient not taking: Reported on 01/12/2018 11/24/17   Ladell Pier, MD  ondansetron (ZOFRAN ODT) 4 MG disintegrating tablet Take 1 tablet (4 mg total) every 8 (eight) hours as needed by mouth for nausea or vomiting. Patient not taking: Reported on 01/10/2017 12/29/16   Duffy Bruce, MD    Past Medical, Surgical Family and Social History reviewed and updated.    Objective:   Today's Vitals   05/03/19 1540  BP: 123/80  Pulse: 88  Resp: 18  Temp: 98.2 F (36.8 C)  TempSrc: Oral  SpO2: 97%  Height: 5\' 4"  (1.626 m)    Wt Readings from Last 3 Encounters:  01/12/18 247 lb (112 kg)  11/24/17 247 lb 3.2 oz (112.1 kg)   Physical Exam Vitals reviewed. Exam conducted with a chaperone present.  Constitutional:      Appearance: Normal appearance.  HENT:     Head: Normocephalic.     Nose: Nose normal.  Abdominal:     General: Abdomen is protuberant. Bowel sounds are normal.     Palpations: Abdomen is soft.     Hernia: A hernia is present. Hernia is present in the left inguinal area and right inguinal area.  Genitourinary:    General: Normal vulva.     Exam position: Lithotomy position.     Pubic Area: No rash.      Labia:        Right: No rash, tenderness, lesion or injury.        Left: No rash, tenderness or lesion.      Vagina: Normal.     Cervix: Discharge present. No friability or erythema.     Uterus: Normal.   Musculoskeletal:     Lumbar back: Tenderness and bony tenderness present. Decreased range of motion. Positive right straight leg raise test and positive left straight leg raise test.  Lymphadenopathy:     Lower Body: Right inguinal adenopathy present. Left inguinal adenopathy present.  Skin:     General: Skin is warm.  Neurological:     Mental Status: She is alert.  Psychiatric:        Attention and Perception: Attention normal.        Mood and Affect: Mood normal.     Lab Results  Component Value Date   HGBA1C 5.9 (H) 05/03/2019      Assessment & Plan:  1. DUB (dysfunctional uterine bleeding) - Cervicovaginal ancillary only, pending  - Ambulatory  referral to Obstetrics / Gynecology (needs complete work-up and evaluation) - TSH  2. Chronic bilateral low back pain with bilateral sciatica -Resume Meloxicam and Cyclobenzaprine   3. Encounter for Papanicolaou smear of cervix - Cytology - PAP([place]) - POCT urine pregnancy, negative   4. Screening for HIV (human immunodeficiency virus) - HIV antibody (with reflex)  5. Morbid obesity ([place]), Body mass index is 42.4 kg/m. Encouraged efforts to reduce weight include engaging in physical activity as tolerated with goal of 150 minutes per week. Improve dietary choices and eat a meal regimen consistent with a Mediterranean or DASH diet. Reduce simple carbohydrates. Do not skip meals and eat healthy snacks throughout the day to avoid over-eating at dinner. Set a goal weight loss that is achievable for you. - Hemoglobin A1c, pending   6. Current smoker Ready to quit: Contemplation  Counseling given: Yes  7. Encounter for annual routine gynecological examination - POCT urine pregnancy -PAP and cytology completed    Meds ordered this encounter  Medications  . cyclobenzaprine (FLEXERIL) 10 MG tablet    Sig: Take 1 tablet (10 mg total) by mouth 2 (two) times daily as needed for muscle spasms.    Dispense:  30 tablet    Refill:  0  . meloxicam (MOBIC) 15 MG tablet    Sig: Take 0.5-1 tablets (7.5-15 mg total) by mouth daily as needed for pain.    Dispense:  30 tablet    Refill:  2     -The patient was given clear instructions to go to ER or return to medical center if symptoms do not improve, worsen or new problems  develop. The patient verbalized understanding.    Molli Barrows, FNP

## 2019-05-04 LAB — HEMOGLOBIN A1C
Est. average glucose Bld gHb Est-mCnc: 123 mg/dL
Hgb A1c MFr Bld: 5.9 % — ABNORMAL HIGH (ref 4.8–5.6)

## 2019-05-04 LAB — HIV ANTIBODY (ROUTINE TESTING W REFLEX): HIV Screen 4th Generation wRfx: NONREACTIVE

## 2019-05-04 LAB — TSH: TSH: 3.05 u[IU]/mL (ref 0.450–4.500)

## 2019-05-04 MED FILL — MELOXICAM 15 MG TABLET: 15 | 30 days supply | Qty: 30 | Fill #0

## 2019-05-04 MED FILL — CYCLOBENZAPRINE 10 MG TAB: 10 | 15 days supply | Qty: 30 | Fill #0

## 2019-05-05 LAB — CERVICOVAGINAL ANCILLARY ONLY
Bacterial Vaginitis (gardnerella): POSITIVE — AB
Candida Glabrata: NEGATIVE
Candida Vaginitis: NEGATIVE
Chlamydia: NEGATIVE
Comment: NEGATIVE
Comment: NEGATIVE
Comment: NEGATIVE
Comment: NEGATIVE
Comment: NEGATIVE
Comment: NORMAL
Neisseria Gonorrhea: NEGATIVE
Trichomonas: NEGATIVE

## 2019-05-09 LAB — CYTOLOGY - PAP
Adequacy: ABSENT
Comment: NEGATIVE
Diagnosis: NEGATIVE
High risk HPV: NEGATIVE

## 2019-06-07 ENCOUNTER — Ambulatory Visit: Payer: Self-pay | Admitting: Critical Care Medicine

## 2019-06-09 ENCOUNTER — Other Ambulatory Visit: Payer: Self-pay

## 2019-06-09 ENCOUNTER — Encounter: Payer: Self-pay | Admitting: Obstetrics & Gynecology

## 2019-06-09 ENCOUNTER — Ambulatory Visit (INDEPENDENT_AMBULATORY_CARE_PROVIDER_SITE_OTHER): Payer: Self-pay | Admitting: Obstetrics & Gynecology

## 2019-06-09 VITALS — BP 120/81 | HR 85 | Wt 257.1 lb

## 2019-06-09 DIAGNOSIS — B9689 Other specified bacterial agents as the cause of diseases classified elsewhere: Secondary | ICD-10-CM

## 2019-06-09 DIAGNOSIS — Z6839 Body mass index (BMI) 39.0-39.9, adult: Secondary | ICD-10-CM

## 2019-06-09 DIAGNOSIS — N938 Other specified abnormal uterine and vaginal bleeding: Secondary | ICD-10-CM

## 2019-06-09 DIAGNOSIS — N760 Acute vaginitis: Secondary | ICD-10-CM

## 2019-06-09 DIAGNOSIS — E6609 Other obesity due to excess calories: Secondary | ICD-10-CM

## 2019-06-09 DIAGNOSIS — F17200 Nicotine dependence, unspecified, uncomplicated: Secondary | ICD-10-CM

## 2019-06-09 MED ORDER — METRONIDAZOLE 500 MG PO TABS: 500.0000 mg | ORAL_TABLET | Freq: Two times a day (BID) | ORAL | 0 refills | Status: AC

## 2019-06-09 MED FILL — metroNIDAZOLE 500 MG TABS: 500 | 7 days supply | Qty: 14 | Fill #0

## 2019-06-09 NOTE — Progress Notes (Signed)
Patient ID: Heidi Bartlett, female   DOB: [birth year], 38 y.o.   MRN: [id number]  Chief Complaint  Patient presents with  . Abnormal Uterine Bleeding    HPI Heidi Bartlett is a 39 y.o. female.  [id number] No LMP recorded (approximate). (Menstrual status: Irregular Periods).  Long history of episodes of DUB. LMP lasted 3 weeks and just ceased in last few days. She has not conceived since 2004,not on birth control. Financial assistance application is pending due to to insurance HPI  No past medical history on file.  Past Surgical History:  Procedure Laterality Date  . CESAREAN SECTION  2004  . CHOLECYSTECTOMY  2004    No family history on file.  Social History Social History   Tobacco Use  . Smoking status: Current Every Day Smoker    Packs/day: 0.50    Types: Cigarettes  . Smokeless tobacco: Never Used  Substance Use Topics  . Alcohol use: Not Currently  . Drug use: Not Currently    Allergies  Allergen Reactions  . Codeine Hives    Current Outpatient Medications  Medication Sig Dispense Refill  . cyclobenzaprine (FLEXERIL) 10 MG tablet Take 1 tablet (10 mg total) by mouth 2 (two) times daily as needed for muscle spasms. 30 tablet 0  . meloxicam (MOBIC) 15 MG tablet Take 0.5-1 tablets (7.5-15 mg total) by mouth daily as needed for pain. 30 tablet 2   No current facility-administered medications for this visit.    Review of Systems Review of Systems  Constitutional: Negative.   Respiratory: Negative.   Gastrointestinal: Negative.   Genitourinary: Positive for menstrual problem and vaginal discharge.    Blood pressure 120/81, pulse 85, weight 257 lb 1.6 oz (116.6 kg), unknown if currently breastfeeding.  Physical Exam Physical Exam Vitals and nursing note reviewed. Exam conducted with a chaperone present.  Constitutional:      Appearance: Normal appearance. She is obese.  Cardiovascular:     Rate and Rhythm: Normal rate.  Pulmonary:     Effort:  Pulmonary effort is normal.  Genitourinary:    General: Normal vulva.     Vagina: Vaginal discharge: slight thin discharge.     Comments: Pelvic exam: normal external genitalia, vulva, vagina, cervix, uterus and adnexa.  Skin:    General: Skin is warm and dry.  Neurological:     General: No focal deficit present.     Mental Status: She is alert.  Psychiatric:        Mood and Affect: Mood normal.     Data Reviewed  CLINICAL DATA:  Recurrent heavy vaginal bleeding.  EXAM: TRANSABDOMINAL AND TRANSVAGINAL ULTRASOUND OF PELVIS  TECHNIQUE: Both transabdominal and transvaginal ultrasound examinations of the pelvis were performed. Transabdominal technique was performed for global imaging of the pelvis including uterus, ovaries, adnexal regions, and pelvic cul-de-sac. It was necessary to proceed with endovaginal exam following the transabdominal exam to visualize the bilateral ovaries.  COMPARISON:  None  FINDINGS: Uterus  Measurements: 8.8 x 4.4 x 4.5 cm. Multiple small nabothian cysts. Uterus is heterogeneous without discrete fibroids.  Endometrium  Thickness: 0.9 cm.  No focal abnormality visualized.  Right ovary  Measurements: 5.4 x 2.7 x 4.1 cm. Anechoic cyst in the right ovary that measures 3.5 x 2.7 x 3.3 cm.  Left ovary  Measurements: 4.9 x 3.0 x 4.4 cm. Anechoic cyst in left ovary measuring 4.6 x 2.0 x 3.5 cm. There is an additional small cyst or follicle measuring up to 2.0 cm.  Other  findings  Trace free fluid.  IMPRESSION: Uterus is heterogeneous without discrete fibroids.  Bilateral ovarian cysts.   Electronically Signed   By: Markus Daft M.D.   On: 01/10/2017 14:20 Assessment DUB, probably PCOS, oligo-ovulatory. Secondary infertility  Plan Financial assistance applied RTC in 8 weeks to review progress and order f/u US, hormonal evaluation for DUB BV on labs-Flagyl ordered    [name] 06/09/2019, 11:50 AM

## 2019-06-09 NOTE — Patient Instructions (Signed)
Abnormal Uterine Bleeding °Abnormal uterine bleeding means bleeding more than usual from your uterus. It can include: °· Bleeding between periods. °· Bleeding after sex. °· Bleeding that is heavier than normal. °· Periods that last longer than usual. °· Bleeding after you have stopped having your period (menopause). °There are many problems that may cause this. You should see a doctor for any kind of bleeding that is not normal. Treatment depends on the cause of the bleeding. °Follow these instructions at home: °· Watch your condition for any changes. °· Do not use tampons, douche, or have sex, if your doctor tells you not to. °· Change your pads often. °· Get regular well-woman exams. Make sure they include a pelvic exam and cervical cancer screening. °· Keep all follow-up visits as told by your doctor. This is important. °Contact a doctor if: °· The bleeding lasts more than one week. °· You feel dizzy at times. °· You feel like you are going to throw up (nauseous). °· You throw up. °Get help right away if: °· You pass out. °· You have to change pads every hour. °· You have belly (abdominal) pain. °· You have a fever. °· You get sweaty. °· You get weak. °· You passing large blood clots from your vagina. °Summary °· Abnormal uterine bleeding means bleeding more than usual from your uterus. °· There are many problems that may cause this. You should see a doctor for any kind of bleeding that is not normal. °· Treatment depends on the cause of the bleeding. °This information is not intended to replace advice given to you by your health care provider. Make sure you discuss any questions you have with your health care provider. °Document Revised: 01/29/2016 Document Reviewed: 01/29/2016 °Elsevier Patient Education © 2020 [employer] ° °

## 2019-08-11 ENCOUNTER — Other Ambulatory Visit: Payer: Self-pay

## 2019-08-11 ENCOUNTER — Encounter: Payer: Self-pay | Admitting: Obstetrics & Gynecology

## 2019-08-11 ENCOUNTER — Other Ambulatory Visit: Payer: Self-pay | Admitting: Family Medicine

## 2019-08-11 ENCOUNTER — Ambulatory Visit (INDEPENDENT_AMBULATORY_CARE_PROVIDER_SITE_OTHER): Payer: Self-pay | Admitting: Obstetrics & Gynecology

## 2019-08-11 ENCOUNTER — Other Ambulatory Visit: Payer: Self-pay | Admitting: Obstetrics & Gynecology

## 2019-08-11 VITALS — BP 138/96 | HR 85 | Ht 69.0 in | Wt 262.6 lb

## 2019-08-11 DIAGNOSIS — Z803 Family history of malignant neoplasm of breast: Secondary | ICD-10-CM

## 2019-08-11 DIAGNOSIS — N938 Other specified abnormal uterine and vaginal bleeding: Secondary | ICD-10-CM

## 2019-08-11 DIAGNOSIS — E6609 Other obesity due to excess calories: Secondary | ICD-10-CM

## 2019-08-11 DIAGNOSIS — Z6839 Body mass index (BMI) 39.0-39.9, adult: Secondary | ICD-10-CM

## 2019-08-11 DIAGNOSIS — L658 Other specified nonscarring hair loss: Secondary | ICD-10-CM

## 2019-08-11 MED ORDER — SPIRONOLACTONE 50 MG PO TABS: 50.0000 mg | ORAL_TABLET | Freq: Every day | ORAL | 2 refills | Status: DC

## 2019-08-11 MED ORDER — METFORMIN HCL 500 MG PO TABS: 500.0000 mg | ORAL_TABLET | Freq: Every day | ORAL | 5 refills | Status: DC

## 2019-08-11 MED FILL — METFORMIN HCL 500 MG TABS: 500 | 30 days supply | Qty: 30 | Fill #0

## 2019-08-11 MED FILL — SPIRONOLACTONE 50 MG TABLET: 50 | 7 days supply | Qty: 30 | Fill #0

## 2019-08-11 NOTE — Progress Notes (Signed)
Notes from last visit on 06/09/19:  Assessment DUB, probably PCOS, oligo-ovulatory. Secondary infertility  Plan Financial assistance applied RTC in 8 weeks to review progress and order f/u US, hormonal evaluation for DUB BV on labs-Flagyl ordered    [name] 06/09/2019, 11:50 AM

## 2019-08-11 NOTE — Patient Instructions (Signed)
Abnormal Uterine Bleeding °Abnormal uterine bleeding means bleeding more than usual from your uterus. It can include: °· Bleeding between periods. °· Bleeding after sex. °· Bleeding that is heavier than normal. °· Periods that last longer than usual. °· Bleeding after you have stopped having your period (menopause). °There are many problems that may cause this. You should see a doctor for any kind of bleeding that is not normal. Treatment depends on the cause of the bleeding. °Follow these instructions at home: °· Watch your condition for any changes. °· Do not use tampons, douche, or have sex, if your doctor tells you not to. °· Change your pads often. °· Get regular well-woman exams. Make sure they include a pelvic exam and cervical cancer screening. °· Keep all follow-up visits as told by your doctor. This is important. °Contact a doctor if: °· The bleeding lasts more than one week. °· You feel dizzy at times. °· You feel like you are going to throw up (nauseous). °· You throw up. °Get help right away if: °· You pass out. °· You have to change pads every hour. °· You have belly (abdominal) pain. °· You have a fever. °· You get sweaty. °· You get weak. °· You passing large blood clots from your vagina. °Summary °· Abnormal uterine bleeding means bleeding more than usual from your uterus. °· There are many problems that may cause this. You should see a doctor for any kind of bleeding that is not normal. °· Treatment depends on the cause of the bleeding. °This information is not intended to replace advice given to you by your health care provider. Make sure you discuss any questions you have with your health care provider. °Document Revised: 01/29/2016 Document Reviewed: 01/29/2016 °Elsevier Patient Education © 2020 [employer] ° °

## 2019-08-11 NOTE — Progress Notes (Signed)
Patient ID: Heidi Bartlett, female   DOB: [birth year], 39 y.o.   MRN: [id number]  Chief Complaint  Patient presents with  . Follow-up   Menometrorrhagia HPI Heidi Bartlett is a 39 y.o. female.  [id number] No LMP recorded. (Menstrual status: Irregular Periods). Menses may last for 3 weeks. She notes hair loss. Financial assistance approved 75%. Would prefer to be able to conceive if possible. HPI  No past medical history on file.  Past Surgical History:  Procedure Laterality Date  . CESAREAN SECTION  2004  . CHOLECYSTECTOMY  2004    No family history on file.  Social History Social History   Tobacco Use  . Smoking status: Current Every Day Smoker    Packs/day: 0.50    Types: Cigarettes  . Smokeless tobacco: Never Used  Vaping Use  . Vaping Use: Never used  Substance Use Topics  . Alcohol use: Not Currently  . Drug use: Not Currently    Allergies  Allergen Reactions  . Codeine Hives    Current Outpatient Medications  Medication Sig Dispense Refill  . cyclobenzaprine (FLEXERIL) 10 MG tablet Take 1 tablet (10 mg total) by mouth 2 (two) times daily as needed for muscle spasms. (Patient not taking: Reported on 08/11/2019) 30 tablet 0  . meloxicam (MOBIC) 15 MG tablet Take 0.5-1 tablets (7.5-15 mg total) by mouth daily as needed for pain. (Patient not taking: Reported on 08/11/2019) 30 tablet 2  . metFORMIN (GLUCOPHAGE) 500 MG tablet Take 1 tablet (500 mg total) by mouth daily with breakfast. 30 tablet 5  . spironolactone (ALDACTONE) 50 MG tablet Take 1 tablet (50 mg total) by mouth daily. Will start with 50 mg twice daily, and increase to 100 mg twice daily as needed 30 tablet 2   No current facility-administered medications for this visit.    Review of Systems Review of Systems  Constitutional: Positive for unexpected weight change (gain).  Endocrine: Negative.   Genitourinary: Positive for menstrual problem.  Skin:       Bald spot on scalp    Blood  pressure (!) 138/96, pulse 85, height 5\' 9"  (1.753 m), weight 262 lb 9.6 oz (119.1 kg), unknown if currently breastfeeding.  Physical Exam Physical Exam Constitutional:      Appearance: She is obese. She is not ill-appearing.  Pulmonary:     Effort: Pulmonary effort is normal.  Neurological:     General: No focal deficit present.     Mental Status: She is alert.  Psychiatric:        Mood and Affect: Mood normal.        Behavior: Behavior normal.     Data Reviewed Results for Heidi Bartlett (MRN [id number]) as of 08/11/2019 16:38  Ref. Range 05/03/2019 16:46  Hemoglobin A1C Latest Ref Range: 4.8 - 5.6 % 5.9 (H)    Assessment Dysfunctional uterine bleeding - Plan: metFORMIN (GLUCOPHAGE) 500 MG tablet, TestT+TestF+SHBG, spironolactone (ALDACTONE) 50 MG tablet, DHEA-sulfate, FSH, Estrogens, Total, US PELVIC COMPLETE WITH TRANSVAGINAL  Class 2 obesity due to excess calories without serious comorbidity with body mass index (BMI) of 39.0 to 39.9 in adult - Plan: metFORMIN (GLUCOPHAGE) 500 MG tablet, TestT+TestF+SHBG, spironolactone (ALDACTONE) 50 MG tablet  Family history of breast cancer in first degree relative - Plan: MM DIGITAL SCREENING BILATERAL  Female pattern hair loss  Suspect oligoovulation. US showed ovarian cysts 2018  Plan Orders Placed This Encounter  Procedures  . MM DIGITAL SCREENING BILATERAL    Standing Status:   Future  Standing Expiration Date:   11/11/2019    Order Specific Question:   Reason for exam:    Answer:   Screening    Order Specific Question:   Is the patient pregnant?    Answer:   No    Order Specific Question:   Preferred imaging location?    Answer:   [facility]  . US PELVIC COMPLETE WITH TRANSVAGINAL    Standing Status:   Future    Standing Expiration Date:   08/10/2020    Order Specific Question:   Reason for Exam (SYMPTOM  OR DIAGNOSIS REQUIRED)    Answer:   dub    Order Specific Question:   Preferred imaging location?     Answer:   [facility]  . TestT+TestF+SHBG  . DHEA-sulfate  . [place]  . Estrogens, Total   RTC for f/u    [name] 08/11/2019, 12:01 PM

## 2019-08-15 ENCOUNTER — Other Ambulatory Visit: Payer: Self-pay | Admitting: Family Medicine

## 2019-08-18 LAB — FOLLICLE STIMULATING HORMONE: FSH: 9.3 m[IU]/mL

## 2019-08-18 LAB — ESTROGENS, TOTAL: Estrogen: 218 pg/mL

## 2019-08-18 LAB — TESTT+TESTF+SHBG
Sex Hormone Binding: 72.3 nmol/L (ref 24.6–122.0)
Testosterone, Free: 0.9 pg/mL (ref 0.0–4.2)
Testosterone, Total, LC/MS: 42.9 ng/dL (ref 10.0–55.0)

## 2019-08-18 LAB — DHEA-SULFATE: DHEA-SO4: 44.3 ug/dL — ABNORMAL LOW (ref 57.3–279.2)

## 2019-08-24 ENCOUNTER — Other Ambulatory Visit: Payer: Self-pay

## 2019-08-24 ENCOUNTER — Ambulatory Visit
Admission: RE | Admit: 2019-08-24 | Discharge: 2019-08-24 | Disposition: A | Discharge status: Home / Self Care | Payer: 59 | Source: Ambulatory Visit | Attending: Obstetrics & Gynecology | Admitting: Obstetrics & Gynecology

## 2019-08-24 DIAGNOSIS — N938 Other specified abnormal uterine and vaginal bleeding: Secondary | ICD-10-CM | POA: Insufficient documentation

## 2019-08-25 ENCOUNTER — Telehealth (INDEPENDENT_AMBULATORY_CARE_PROVIDER_SITE_OTHER): Payer: Self-pay | Admitting: Lactation Services

## 2019-08-25 DIAGNOSIS — N83209 Unspecified ovarian cyst, unspecified side: Secondary | ICD-10-CM

## 2019-08-25 DIAGNOSIS — N938 Other specified abnormal uterine and vaginal bleeding: Secondary | ICD-10-CM

## 2019-08-25 NOTE — Telephone Encounter (Signed)
Called a spoke patient in regards to [place] results. Patient reports she read Dr. Jordan Hawks note to her, she voiced that she is scared after reading the notes.   Reviewed with patient that she will have a follow up appt scheduled with an MD to discuss further steps. Patient voiced understanding. Note to front desk to call patient to schedule.

## 2019-08-25 NOTE — Telephone Encounter (Signed)
-----   Message from Woodroe Mode, MD sent at 08/25/2019  1:10 PM EDT ----- Ovarian cyst needs further evaluation please have f/u appointment

## 2019-08-29 MED FILL — SPIRONOLACTONE 50 MG TABLET: 50 | 7 days supply | Qty: 30 | Fill #1

## 2019-09-06 ENCOUNTER — Other Ambulatory Visit: Payer: Self-pay

## 2019-09-06 ENCOUNTER — Ambulatory Visit: Payer: 59 | Attending: Family | Admitting: Family

## 2019-09-06 ENCOUNTER — Encounter: Payer: Self-pay | Admitting: Family

## 2019-09-06 VITALS — BP 118/80 | HR 99 | Temp 97.6°F | Resp 18 | Ht 69.0 in | Wt 257.0 lb

## 2019-09-06 DIAGNOSIS — G8929 Other chronic pain: Secondary | ICD-10-CM

## 2019-09-06 DIAGNOSIS — M5441 Lumbago with sciatica, right side: Secondary | ICD-10-CM | POA: Diagnosis not present

## 2019-09-06 DIAGNOSIS — M5442 Lumbago with sciatica, left side: Secondary | ICD-10-CM

## 2019-09-06 MED ORDER — CYCLOBENZAPRINE HCL 10 MG PO TABS: 10.0000 mg | ORAL_TABLET | Freq: Every day | ORAL | 2 refills | Status: DC

## 2019-09-06 MED FILL — METFORMIN HCL 500 MG TABS: 500 | 30 days supply | Qty: 30 | Fill #1

## 2019-09-06 MED FILL — CYCLOBENZAPRINE 10 MG TAB: 10 | 30 days supply | Qty: 30 | Fill #0

## 2019-09-06 NOTE — Progress Notes (Signed)
Here for meds refills

## 2019-09-06 NOTE — Progress Notes (Signed)
Patient ID: Heidi Bartlett, female    DOB: [birth year]  MRN: [id number]  CC: Back Pain Follow-Up  Subjective: Heidi Bartlett is a 39 y.o. female with history of sciatica of right side, dysfunctional uterine bleeding, and class 2 obesity due to excess calories without serious comorbidity with body mass index (BMI) of 39.0 to 39.9 adult who presents for back pain follow-up.  1. BACK PAIN FOLLOW-UP: Onset: years Location: bilateral lower back Radiation: bilateral legs, right leg worse than left Pain Rating: 10/10 Pain Duration: comes and goes  Characteristics: stabbing, prevents sleep at night if she does not have Flexeril Aggravating Factors: [employer] drinks but says she no longer drinks them, reports pain is worse at night, standing for long periods  Relieving Factors: heating pad, Flexeril, Meloxicam Trauma: denies Ambulation difficulty: walks without assistive device Swelling: denies Tenderness: denies Warmth: denies  Last visit 05/03/2019 with nurse practitioner Kenton Kingfisher. During that encounter resumed on Meloxicam and Cyclobenzaprine. Report she worries about her uterine bleeding and has appointment with her gynecologist soon to discuss test results. Not ready for medication management at this time. Denies thoughts of self-harm and suicidal ideation.   Depression screen [facility] 2/9 09/06/2019 08/11/2019 06/09/2019  Decreased Interest 1 0 0  Down, Depressed, Hopeless 1 0 0  PHQ - 2 Score 2 0 0  Altered sleeping 1 0 3  Tired, decreased energy 1 0 0  Change in appetite 1 0 2  Feeling bad or failure about yourself  1 0 0  Trouble concentrating 1 0 0  Moving slowly or fidgety/restless 0 0 0  Suicidal thoughts 0 0 0  PHQ-9 Score 7 0 5   GAD 7 : Generalized Anxiety Score 09/06/2019 08/11/2019 06/09/2019 11/24/2017  Nervous, Anxious, on Edge 1 0 3 2  Control/stop worrying 1 0 3 2  Worry too much - different things 1 0 3 2  Trouble relaxing 1 0 3 2  Restless 1 0 0 2  Easily annoyed or  irritable 1 0 0 3  Afraid - awful might happen 1 0 0 2  Total GAD 7 Score 7 0 12 15      Patient Active Problem List   Diagnosis Date Noted  . Dysfunctional uterine bleeding 07/02/2018  . Sciatica of right side 11/24/2017  . Class 2 obesity due to excess calories without serious comorbidity with body mass index (BMI) of 39.0 to 39.9 in adult 11/24/2017  . Tobacco dependence 11/24/2017     Current Outpatient Medications on File Prior to Visit  Medication Sig Dispense Refill  . cyclobenzaprine (FLEXERIL) 10 MG tablet Take 1 tablet (10 mg total) by mouth 2 (two) times daily as needed for muscle spasms. (Patient not taking: Reported on 08/11/2019) 30 tablet 0  . meloxicam (MOBIC) 15 MG tablet Take 0.5-1 tablets (7.5-15 mg total) by mouth daily as needed for pain. (Patient not taking: Reported on 08/11/2019) 30 tablet 2  . metFORMIN (GLUCOPHAGE) 500 MG tablet Take 1 tablet (500 mg total) by mouth daily with breakfast. 30 tablet 5  . spironolactone (ALDACTONE) 50 MG tablet Take 1 tablet (50 mg total) by mouth daily. Will start with 50 mg twice daily, and increase to 100 mg twice daily as needed 30 tablet 2   No current facility-administered medications on file prior to visit.    Allergies  Allergen Reactions  . Codeine Hives    Social History   Socioeconomic History  . Marital status: Married    Spouse name: Not on  file  . Number of children: 0  . Years of education: 9th grade  . Highest education level: Not on file  Occupational History  . Occupation: unemployed  Tobacco Use  . Smoking status: Current Every Day Smoker    Packs/day: 0.50    Types: Cigarettes  . Smokeless tobacco: Never Used  Vaping Use  . Vaping Use: Never used  Substance and Sexual Activity  . Alcohol use: Not Currently  . Drug use: Not Currently  . Sexual activity: Not on file  Other Topics Concern  . Not on file  Social History Narrative  . Not on file   Social Determinants of Health   Financial  Resource Strain:   . Difficulty of Paying Living Expenses:   Food Insecurity: Food Insecurity Present  . Worried About Charity fundraiser in the Last Year: Sometimes true  . Ran Out of Food in the Last Year: Never true  Transportation Needs: No Transportation Needs  . Lack of Transportation (Medical): No  . Lack of Transportation (Non-Medical): No  Physical Activity:   . Days of Exercise per Week:   . Minutes of Exercise per Session:   Stress:   . Feeling of Stress :   Social Connections:   . Frequency of Communication with Friends and Family:   . Frequency of Social Gatherings with Friends and Family:   . Attends Religious Services:   . Active Member of Clubs or Organizations:   . Attends Archivist Meetings:   Marland Kitchen Marital Status:   Intimate Partner Violence:   . Fear of Current or Ex-Partner:   . Emotionally Abused:   Marland Kitchen Physically Abused:   . Sexually Abused:     No family history on file.  Past Surgical History:  Procedure Laterality Date  . CESAREAN SECTION  2004  . CHOLECYSTECTOMY  2004    ROS: Review of Systems Negative except as stated above  PHYSICAL EXAM: Vitals with BMI 09/06/2019 08/11/2019 06/09/2019  Height 5\' 9"  5\' 9"  -  Weight 257 lbs 262 lbs 10 oz 257 lbs 2 oz  BMI 14.43 15.40 -  Systolic 086 761 950  Diastolic 80 96 81  Pulse 99 85 85  SpO2- 99%, room air  Temperature- 97.45F, oral Wt Readings from Last 3 Encounters:  09/06/19 257 lb (116.6 kg)  08/11/19 262 lb 9.6 oz (119.1 kg)  06/09/19 257 lb 1.6 oz (116.6 kg)    Physical Exam General appearance - alert, well appearing, and in no distress, oriented to person, place, and time and overweight Mental status - alert, oriented to person, place, and time Neck - supple, no significant adenopathy Lymphatics - no palpable lymphadenopathy, no hepatosplenomegaly Chest - clear to auscultation, no wheezes, rales or rhonchi, symmetric air entry, no tachypnea, retractions or cyanosis Heart -  normal rate, regular rhythm, normal S1, S2, no murmurs, rubs, clicks or gallops Neurological - alert, oriented, normal speech, no focal findings or movement disorder noted Musculoskeletal -  Lumbar back: tenderness and bony tenderness present. Decreased range of motion. Positive right straight leg raise test and positive left straight leg raise test  ASSESSMENT AND PLAN: 1. Chronic bilateral low back pain with bilateral sciatica: -Patient with chronic bilateral low back pain with bilateral sciatica. Reports right leg is worse than left.  -Continue Meloxicam and Cyclobenzaprine as prescribed.  -Reports she has Meloxicam available at home and does not take this one much. -Follow-up with primary physician in 3 months or sooner if needed. - cyclobenzaprine (  FLEXERIL) 10 MG tablet; Take 1 tablet (10 mg total) by mouth at bedtime.  Dispense: 30 tablet; Refill: 2  Patient was given the opportunity to ask questions.  Patient verbalized understanding of the plan and was able to repeat key elements of the plan. Patient was given clear instructions to go to Emergency Department or return to medical center if symptoms don't improve, worsen, or new problems develop.The patient verbalized understanding.  Camillia Herter, NP

## 2019-09-06 NOTE — Patient Instructions (Addendum)
Cyclobenzaprine for back pain and sciatica. Follow-up with primary physician as needed. Sciatica  Sciatica is pain, weakness, tingling, or loss of feeling (numbness) along the sciatic nerve. The sciatic nerve starts in the lower back and goes down the back of each leg. Sciatica usually goes away on its own or with treatment. Sometimes, sciatica may come back (recur). What are the causes? This condition happens when the sciatic nerve is pinched or has pressure put on it. This may be the result of:  A disk in between the bones of the spine bulging out too far (herniated disk).  Changes in the spinal disks that occur with aging.  A condition that affects a muscle in the butt.  Extra bone growth near the sciatic nerve.  A break (fracture) of the area between your hip bones (pelvis).  Pregnancy.  Tumor. This is rare. What increases the risk? You are more likely to develop this condition if you:  Play sports that put pressure or stress on the spine.  Have poor strength and ease of movement (flexibility).  Have had a back injury in the past.  Have had back surgery.  Sit for long periods of time.  Do activities that involve bending or lifting over and over again.  Are very overweight (obese). What are the signs or symptoms? Symptoms can vary from mild to very bad. They may include:  Any of these problems in the lower back, leg, hip, or butt: ? Mild tingling, loss of feeling, or dull aches. ? Burning sensations. ? Sharp pains.  Loss of feeling in the back of the calf or the sole of the foot.  Leg weakness.  Very bad back pain that makes it hard to move. These symptoms may get worse when you cough, sneeze, or laugh. They may also get worse when you sit or stand for long periods of time. How is this treated? This condition often gets better without any treatment. However, treatment may include:  Changing or cutting back on physical activity when you have pain.  Doing  exercises and stretching.  Putting ice or heat on the affected area.  Medicines that help: ? To relieve pain and swelling. ? To relax your muscles.  Shots (injections) of medicines that help to relieve pain, irritation, and swelling.  Surgery. Follow these instructions at home: Medicines  Take over-the-counter and prescription medicines only as told by your doctor.  Ask your doctor if the medicine prescribed to you: ? Requires you to avoid driving or using heavy machinery. ? Can cause trouble pooping (constipation). You may need to take these steps to prevent or treat trouble pooping:  Drink enough fluids to keep your pee (urine) pale yellow.  Take over-the-counter or prescription medicines.  Eat foods that are high in fiber. These include beans, whole grains, and fresh fruits and vegetables.  Limit foods that are high in fat and sugar. These include fried or sweet foods. Managing pain      If told, put ice on the affected area. ? Put ice in a plastic bag. ? Place a towel between your skin and the bag. ? Leave the ice on for 20 minutes, 2-3 times a day.  If told, put heat on the affected area. Use the heat source that your doctor tells you to use, such as a moist heat pack or a heating pad. ? Place a towel between your skin and the heat source. ? Leave the heat on for 20-30 minutes. ? Remove the heat if  your skin turns bright red. This is very important if you are unable to feel pain, heat, or cold. You may have a greater risk of getting burned. Activity   Return to your normal activities as told by your doctor. Ask your doctor what activities are safe for you.  Avoid activities that make your symptoms worse.  Take short rests during the day. ? When you rest for a long time, do some physical activity or stretching between periods of rest. ? Avoid sitting for a long time without moving. Get up and move around at least one time each hour.  Exercise and stretch  regularly, as told by your doctor.  Do not lift anything that is heavier than 10 lb (4.5 kg) while you have symptoms of sciatica. ? Avoid lifting heavy things even when you do not have symptoms. ? Avoid lifting heavy things over and over.  When you lift objects, always lift in a way that is safe for your body. To do this, you should: ? Bend your knees. ? Keep the object close to your body. ? Avoid twisting. General instructions  Stay at a healthy weight.  Wear comfortable shoes that support your feet. Avoid wearing high heels.  Avoid sleeping on a mattress that is too soft or too hard. You might have less pain if you sleep on a mattress that is firm enough to support your back.  Keep all follow-up visits as told by your doctor. This is important. Contact a doctor if:  You have pain that: ? Wakes you up when you are sleeping. ? Gets worse when you lie down. ? Is worse than the pain you have had in the past. ? Lasts longer than 4 weeks.  You lose weight without trying. Get help right away if:  You cannot control when you pee (urinate) or poop (have a bowel movement).  You have weakness in any of these areas and it gets worse: ? Lower back. ? The area between your hip bones. ? Butt. ? Legs.  You have redness or swelling of your back.  You have a burning feeling when you pee. Summary  Sciatica is pain, weakness, tingling, or loss of feeling (numbness) along the sciatic nerve.  This condition happens when the sciatic nerve is pinched or has pressure put on it.  Sciatica can cause pain, tingling, or loss of feeling (numbness) in the lower back, legs, hips, and butt.  Treatment often includes rest, exercise, medicines, and putting ice or heat on the affected area. This information is not intended to replace advice given to you by your health care provider. Make sure you discuss any questions you have with your health care provider. Document Revised: 02/22/2018 Document  Reviewed: 02/22/2018 Elsevier Patient Education  [place].

## 2019-09-14 MED FILL — SPIRONOLACTONE 50 MG TABLET: 50 | 7 days supply | Qty: 30 | Fill #2

## 2019-09-15 ENCOUNTER — Ambulatory Visit (INDEPENDENT_AMBULATORY_CARE_PROVIDER_SITE_OTHER): Payer: 59 | Admitting: Obstetrics & Gynecology

## 2019-09-15 ENCOUNTER — Other Ambulatory Visit: Payer: Self-pay | Admitting: Obstetrics & Gynecology

## 2019-09-15 ENCOUNTER — Encounter: Payer: Self-pay | Admitting: Obstetrics & Gynecology

## 2019-09-15 ENCOUNTER — Other Ambulatory Visit: Payer: Self-pay

## 2019-09-15 VITALS — BP 120/79 | HR 97 | Ht 69.0 in | Wt 259.0 lb

## 2019-09-15 DIAGNOSIS — N83209 Unspecified ovarian cyst, unspecified side: Secondary | ICD-10-CM

## 2019-09-15 DIAGNOSIS — B354 Tinea corporis: Secondary | ICD-10-CM

## 2019-09-15 DIAGNOSIS — N938 Other specified abnormal uterine and vaginal bleeding: Secondary | ICD-10-CM

## 2019-09-15 DIAGNOSIS — Z6839 Body mass index (BMI) 39.0-39.9, adult: Secondary | ICD-10-CM

## 2019-09-15 MED ORDER — CLOTRIMAZOLE 1 % EX SOLN
Freq: Two times a day (BID) | CUTANEOUS | Status: DC
Start: 2019-09-15 — End: 2020-01-09

## 2019-09-15 MED ORDER — SPIRONOLACTONE 100 MG PO TABS: 50.0000 mg | ORAL_TABLET | Freq: Every day | ORAL | 4 refills | Status: DC

## 2019-09-15 MED ORDER — MEGESTROL ACETATE 40 MG PO TABS: 40.0000 mg | ORAL_TABLET | Freq: Two times a day (BID) | ORAL | 1 refills | Status: DC

## 2019-09-15 MED FILL — MEGESTROL 40 MG TABLET: 40 | 30 days supply | Qty: 60 | Fill #0

## 2019-09-15 NOTE — Progress Notes (Signed)
Patient ID: Heidi Bartlett, female   DOB: 1980/04/26, 39 y.o.   MRN: [id number]  Chief Complaint  Patient presents with   Ovarian Cyst     HPI Heidi Bartlett is a 39 y.o. female.  [id number] Patient's last menstrual period was 09/06/2019 (exact date). Patient still has DUB, s/p [place] for f/u. Labs for PCOS reviewed and are unremarkable HPI  No past medical history on file.  Past Surgical History:  Procedure Laterality Date   CESAREAN SECTION  2004   CHOLECYSTECTOMY  2004    No family history on file.  Social History Social History   Tobacco Use   Smoking status: Current Every Day Smoker    Packs/day: 0.50    Types: Cigarettes   Smokeless tobacco: Never Used  Vaping Use   Vaping Use: Never used  Substance Use Topics   Alcohol use: Not Currently   Drug use: Not Currently    Allergies  Allergen Reactions   Codeine Hives    Current Outpatient Medications  Medication Sig Dispense Refill   Biotin w/ Vitamins C & E (HAIR/SKIN/NAILS PO) Take by mouth.     cyclobenzaprine (FLEXERIL) 10 MG tablet Take 1 tablet (10 mg total) by mouth at bedtime. 30 tablet 2   folic acid (FOLVITE) 921 MCG tablet Take 400 mcg by mouth daily.     metFORMIN (GLUCOPHAGE) 500 MG tablet Take 1 tablet (500 mg total) by mouth daily with breakfast. 30 tablet 5   Multiple Vitamins-Minerals (WOMENS MULTI VITAMIN & MINERAL PO) Take by mouth.     spironolactone (ALDACTONE) 100 MG tablet Take 0.5 tablets (50 mg total) by mouth daily. Will start with 50 mg twice daily, and increase to 100 mg twice daily as needed 30 tablet 4   Current Facility-Administered Medications  Medication Dose Route Frequency Provider Last Rate Last Admin   clotrimazole (LOTRIMIN) 1 % topical solution   Topical BID Woodroe Mode, MD        Review of Systems Review of Systems  Constitutional: Negative.   Gastrointestinal: Negative.   Genitourinary: Positive for menstrual problem and vaginal bleeding.  Negative for pelvic pain.  Skin: Positive for rash (right thigh, itching, suspects ringworm).    Blood pressure 120/79, pulse 97, height 5\' 9"  (1.753 m), weight (!) 259 lb (117.5 kg), last menstrual period 09/06/2019.  Physical Exam Physical Exam Vitals and nursing note reviewed. Exam conducted with a chaperone present.  Constitutional:      Appearance: She is obese.  HENT:     Head: Normocephalic.  Cardiovascular:     Rate and Rhythm: Normal rate.  Pulmonary:     Effort: Pulmonary effort is normal.  Skin:    Comments: Rash c/w ringworm right outer thigh  Neurological:     Mental Status: She is alert.     Data Reviewed  CLINICAL DATA:  Dysfunctional uterine bleeding, stopping and starting every day, unknown LMP, irregular menses, history Caesarean section  EXAM: TRANSABDOMINAL AND TRANSVAGINAL ULTRASOUND OF PELVIS  TECHNIQUE: Both transabdominal and transvaginal ultrasound examinations of the pelvis were performed. Transabdominal technique was performed for global imaging of the pelvis including uterus, ovaries, adnexal regions, and pelvic cul-de-sac. It was necessary to proceed with endovaginal exam following the transabdominal exam to visualize the uterus, endometrium, and ovaries.  COMPARISON:  01/10/2017  FINDINGS: Uterus  Measurements: 8.8 x 4.5 x 5.5 cm = volume: 114 mL. Anteverted. Anterior wall Caesarean section scar. Nabothian cysts at cervix. No additional uterine mass.  Endometrium  Thickness: 8 mm.  No endometrial fluid or focal abnormalities  Right ovary  No normal appearing RIGHT ovary visualized, see below  Left ovary  Measurements: 3.4 x 2.4 x 3.9 cm = volume: 18.5 mL. Mature graafian follicle. No additional mass.  Other findings  Trace free pelvic fluid. Large cystic lesion identified within RIGHT adnexa either of ovarian or paraovarian origin. Lesion measures 8.3 x 7.8 x 7.2 cm in size. Small amount of dependent  echogenic material/debris. No mural nodularity, wall irregularity, or septations. Scattered reverberation artifacts are present. No abnormal color flow within the wall of the lesion. No additional pelvic masses.  IMPRESSION: No significant abnormalities of the uterus, endometrial complex or LEFT ovary.  8.3 cm diameter minimally complicated cystic lesion of the RIGHT adnexa either of ovarian or paraovarian origin containing a small amount of dependent debris; based on lesion size, appearance and premenopausal state of patient, recommend follow-up pelvic MRI with IV contrast or surgical evaluation.  These results will be called to the ordering clinician or representative by the Radiologist Assistant, and communication documented in the PACS or [employer].   Electronically Signed   By: Lavonia Dana M.D.   On: 08/24/2019 15:00 Results for [name], [name] (MRN [id number]) as of 09/15/2019 15:58  Ref. Range 08/11/2019 12:08  DHEA-SO4 Latest Ref Range: 57.3 - 279.2 ug/dL 44.3 (L)  FSH Latest Units: mIU/mL 9.3  Estrogen Latest Units: pg/mL 218  Sex Horm Binding Glob, Serum Latest Ref Range: 24.6 - 122.0 nmol/L 72.3  Testosterone Free Latest Ref Range: 0.0 - 4.2 pg/mL 0.9     Assessment Suspect DUB due to PCOS, labs essentially normal  Plan Megace 40 mg BID Clotrimazole for tinea corporis   Heidi Bartlett 09/15/2019, 3:22 PM

## 2019-09-15 NOTE — Patient Instructions (Addendum)
[facility] [address] [place], [place] [address] [phone]   or  [email or web address]-[email or web address] **SNAP/EBT/ Other nutritional benefits  [facility] [address], [place], [place] [address] [phone]  or  https://palmer-smith.[email or web address]/ **WIC for  women who are pregnant and postpartum, infants and children up to 38 years old  [facility] [address] [place], [place] [address] [phone]   or   www.theblessedtable.org  **Food pantry  [facility] [place], [place], [place] [address] [phone]   or   https://[email or web address].[email or web address]  **Emergency food and prepared meals  [facility] [address], [place], [place] [address] [phone]   or   www.[email or web address].us **Food pantry  [facility] [address] [place], [place] [address] [phone]   or   [email or web address]-[email or web address] **Food pantry  [employer] [facility] [address] [place], [place] [address] [phone] **Food pantry  [facility] [address] [place], [place] [address] [phone]   or   www.greensborourbanministry.org  Insurance underwriter and prepared meals  [facility] [address] [place], [place], [place], [place] [address] [email or web address]  **Food pantry  [employer] [facility] [address], [place], [place] [address] [phone]   or   www.lbcnow.org  **Food pantry  [facility] [address], [place], [place] [address] [phone]   or   http://patterson-[email or web address] **Food pantry, nutrition education, gardening activities  [employer] [address], [place], [place] [address] [phone] **Food pantry  [facility] [address] [place], [place] [address] [phone]   or   www.salvationarmyof[place]boro.[facility] [place], [place], [place] [address] [phone]   or   http://senior-resources-guilford.org [facility] [address], [place], [place] [address] [phone]   or   www.[email or web address]  **Food pantry  [facility] [address] [place], [place] [address] [phone]   or   vandaliapresbyterianchurch.org **Food pantry   Levonorgestrel intrauterine device (IUD) What is this medicine? LEVONORGESTREL IUD (LEE voe nor jes trel) is a contraceptive (birth control) device. The device is placed inside the uterus by a healthcare professional. It is used to prevent pregnancy. This device can also be used to treat heavy bleeding that occurs during your period. This medicine may be used for other purposes; ask your health care provider or pharmacist if you have questions. COMMON BRAND NAME(S): Minette Headland What should I tell my health care provider before I take this medicine? They need to know if you have any of these conditions:  abnormal Pap smear  cancer of the breast, uterus, or cervix  diabetes  endometritis  genital or pelvic infection now or in the past  have more than one sexual partner or your partner has more than one partner  heart disease  history of an ectopic or tubal pregnancy  immune system problems  IUD in place  liver disease or tumor  problems with blood clots or take blood-thinners  seizures  use intravenous drugs  uterus of unusual shape  vaginal bleeding that has not been explained  an unusual or allergic reaction to levonorgestrel, other hormones, silicone, or polyethylene, medicines, foods,  dyes, or preservatives  pregnant or trying to get pregnant  breast-feeding How should I use this medicine? This device  is placed inside the uterus by a health care professional. Talk to your pediatrician regarding the use of this medicine in children. Special care may be needed. Overdosage: If you think you have taken too much of this medicine contact a poison control center or emergency room at once. NOTE: This medicine is only for you. Do not share this medicine with others. What if I miss a dose? This does not apply. Depending on the brand of device you have inserted, the device will need to be replaced every 3 to 6 years if you wish to continue using this type of birth control. What may interact with this medicine? Do not take this medicine with any of the following medications:  amprenavir  bosentan  fosamprenavir This medicine may also interact with the following medications:  aprepitant  armodafinil  barbiturate medicines for inducing sleep or treating seizures  bexarotene  boceprevir  griseofulvin  medicines to treat seizures like carbamazepine, ethotoin, felbamate, oxcarbazepine, phenytoin, topiramate  modafinil  pioglitazone  rifabutin  rifampin  rifapentine  some medicines to treat HIV infection like atazanavir, efavirenz, indinavir, lopinavir, nelfinavir, tipranavir, ritonavir  St. John's wort  warfarin This list may not describe all possible interactions. Give your health care provider a list of all the medicines, herbs, non-prescription drugs, or dietary supplements you use. Also tell them if you smoke, drink alcohol, or use illegal drugs. Some items may interact with your medicine. What should I watch for while using this medicine? Visit your doctor or health care professional for regular check ups. See your doctor if you or your partner has sexual contact with others, becomes HIV positive, or gets a sexual transmitted disease. This product does  not protect you against HIV infection (AIDS) or other sexually transmitted diseases. You can check the placement of the IUD yourself by reaching up to the top of your vagina with clean fingers to feel the threads. Do not pull on the threads. It is a good habit to check placement after each menstrual period. Call your doctor right away if you feel more of the IUD than just the threads or if you cannot feel the threads at all. The IUD may come out by itself. You may become pregnant if the device comes out. If you notice that the IUD has come out use a backup birth control method like condoms and call your health care provider. Using tampons will not change the position of the IUD and are okay to use during your period. This IUD can be safely scanned with magnetic resonance imaging (MRI) only under specific conditions. Before you have an MRI, tell your healthcare provider that you have an IUD in place, and which type of IUD you have in place. What side effects may I notice from receiving this medicine? Side effects that you should report to your doctor or health care professional as soon as possible:  allergic reactions like skin rash, itching or hives, swelling of the face, lips, or tongue  fever, flu-like symptoms  genital sores  high blood pressure  no menstrual period for 6 weeks during use  pain, swelling, warmth in the leg  pelvic pain or tenderness  severe or sudden headache  signs of pregnancy  stomach cramping  sudden shortness of breath  trouble with balance, talking, or walking  unusual vaginal bleeding, discharge  yellowing of the eyes or skin Side effects that usually do not require medical attention (report to your doctor or health care professional if they continue or  are bothersome):  acne  breast pain  change in sex drive or performance  changes in weight  cramping, dizziness, or faintness while the device is being inserted  headache  irregular menstrual  bleeding within first 3 to 6 months of use  nausea This list may not describe all possible side effects. Call your doctor for medical advice about side effects. You may report side effects to FDA at 1-800-FDA-1088. Where should I keep my medicine? This does not apply. NOTE: This sheet is a summary. It may not cover all possible information. If you have questions about this medicine, talk to your doctor, pharmacist, or health care provider.  2020 Elsevier/Gold Standard (2017-12-15 13:22:01)

## 2019-09-16 ENCOUNTER — Other Ambulatory Visit: Payer: Self-pay | Admitting: *Deleted

## 2019-09-16 DIAGNOSIS — E6609 Other obesity due to excess calories: Secondary | ICD-10-CM

## 2019-09-16 DIAGNOSIS — R21 Rash and other nonspecific skin eruption: Secondary | ICD-10-CM

## 2019-09-16 LAB — CBC
Hematocrit: 34.8 % (ref 34.0–46.6)
Hemoglobin: 11.4 g/dL (ref 11.1–15.9)
MCH: 28.4 pg (ref 26.6–33.0)
MCHC: 32.8 g/dL (ref 31.5–35.7)
MCV: 87 fL (ref 79–97)
Platelets: 185 10*3/uL (ref 150–450)
RBC: 4.02 x10E6/uL (ref 3.77–5.28)
RDW: 14 % (ref 11.7–15.4)
WBC: 7.9 10*3/uL (ref 3.4–10.8)

## 2019-09-16 LAB — CA 125: Cancer Antigen (CA) 125: 11.2 U/mL (ref 0.0–38.1)

## 2019-09-16 MED ORDER — CLOTRIMAZOLE 1 % EX SOLN: 1.0000 "application " | Freq: Two times a day (BID) | CUTANEOUS | 0 refills | Status: DC

## 2019-09-16 NOTE — Progress Notes (Signed)
Patient pharmacy did not receive order for Lotrimin as notified by Patient. Per chart review was placed in error as to be administered in clinic; order resent to pharmacy and patient notified. Nevae Pinnix,RN

## 2019-09-26 ENCOUNTER — Ambulatory Visit (HOSPITAL_COMMUNITY)
Admission: RE | Admit: 2019-09-26 | Discharge: 2019-09-26 | Disposition: A | Discharge status: Home / Self Care | Payer: 59 | Source: Ambulatory Visit | Attending: Obstetrics & Gynecology | Admitting: Obstetrics & Gynecology

## 2019-09-26 ENCOUNTER — Other Ambulatory Visit: Payer: Self-pay

## 2019-09-26 DIAGNOSIS — N83209 Unspecified ovarian cyst, unspecified side: Secondary | ICD-10-CM | POA: Diagnosis present

## 2019-09-26 MED ORDER — GADOBUTROL 1 MMOL/ML IV SOLN
10.0000 mL | Freq: Once | INTRAVENOUS | Status: AC | PRN
  Administered 2019-09-26: 10 mL via INTRAVENOUS

## 2019-09-30 MED FILL — SPIRONOLACTONE 100 MG TAB: 100 | 30 days supply | Qty: 60 | Fill #0

## 2019-10-07 MED FILL — METFORMIN HCL 500 MG TABS: 500 | 30 days supply | Qty: 30 | Fill #2

## 2019-10-07 MED FILL — CYCLOBENZAPRINE 10 MG TAB: 10 | 30 days supply | Qty: 30 | Fill #1

## 2019-10-17 ENCOUNTER — Encounter: Payer: Self-pay | Admitting: Obstetrics & Gynecology

## 2019-10-17 ENCOUNTER — Other Ambulatory Visit: Payer: Self-pay

## 2019-10-17 ENCOUNTER — Ambulatory Visit (INDEPENDENT_AMBULATORY_CARE_PROVIDER_SITE_OTHER): Payer: 59 | Admitting: Obstetrics & Gynecology

## 2019-10-17 VITALS — BP 128/81 | HR 99 | Wt 246.8 lb

## 2019-10-17 DIAGNOSIS — Z3202 Encounter for pregnancy test, result negative: Secondary | ICD-10-CM

## 2019-10-17 DIAGNOSIS — N83201 Unspecified ovarian cyst, right side: Secondary | ICD-10-CM | POA: Diagnosis not present

## 2019-10-17 DIAGNOSIS — N938 Other specified abnormal uterine and vaginal bleeding: Secondary | ICD-10-CM

## 2019-10-17 LAB — POCT PREGNANCY, URINE: Preg Test, Ur: NEGATIVE

## 2019-10-17 NOTE — H&P (View-Only) (Signed)
Patient ID: Heidi Bartlett, female   DOB: 1980/05/17, 39 y.o.   MRN: [id number]  No chief complaint on file. f/u MRI and h/o DUB  HPI Heidi Bartlett is a 39 y.o. female.  [id number] Patient's last menstrual period was 09/13/2019 (within days). No vaginal bleeding while taking Megace and she wishes to continue this for now HPI  History reviewed. No pertinent past medical history.  Past Surgical History:  Procedure Laterality Date  . CESAREAN SECTION  2004  . CHOLECYSTECTOMY  2004  Ex lap possible tubal ectopic pregnancy History reviewed. No pertinent family history.  Social History Social History   Tobacco Use  . Smoking status: Current Every Day Smoker    Packs/day: 0.50    Types: Cigarettes  . Smokeless tobacco: Never Used  Vaping Use  . Vaping Use: Never used  Substance Use Topics  . Alcohol use: Not Currently  . Drug use: Not Currently    Allergies  Allergen Reactions  . Codeine Hives    Current Outpatient Medications  Medication Sig Dispense Refill  . Biotin w/ Vitamins C & E (HAIR/SKIN/NAILS PO) Take by mouth.    . clotrimazole (LOTRIMIN) 1 % external solution Apply 1 application topically 2 (two) times daily. 30 mL 0  . cyclobenzaprine (FLEXERIL) 10 MG tablet Take 1 tablet (10 mg total) by mouth at bedtime. 30 tablet 2  . folic acid (FOLVITE) 017 MCG tablet Take 400 mcg by mouth daily.    . megestrol (MEGACE) 40 MG tablet Take 1 tablet (40 mg total) by mouth 2 (two) times daily. 60 tablet 1  . metFORMIN (GLUCOPHAGE) 500 MG tablet Take 1 tablet (500 mg total) by mouth daily with breakfast. 30 tablet 5  . Multiple Vitamins-Minerals (WOMENS MULTI VITAMIN & MINERAL PO) Take by mouth.    . spironolactone (ALDACTONE) 100 MG tablet Take 0.5 tablets (50 mg total) by mouth daily. Will start with 50 mg twice daily, and increase to 100 mg twice daily as needed 30 tablet 4   Current Facility-Administered Medications  Medication Dose Route Frequency Provider Last  Rate Last Admin  . clotrimazole (LOTRIMIN) 1 % topical solution   Topical BID Woodroe Mode, MD        Review of Systems Review of Systems  Constitutional: Negative.   Respiratory: Negative.   Gastrointestinal: Negative.   Genitourinary: Positive for pelvic pain. Negative for vaginal bleeding and vaginal discharge.    Blood pressure 128/81, pulse 99, weight 246 lb 12.8 oz (111.9 kg), last menstrual period 09/13/2019.  Physical Exam Physical Exam Constitutional:      Appearance: She is obese. She is not ill-appearing.  Pulmonary:     Effort: Pulmonary effort is normal.  Abdominal:     Comments: Midline surgical scar below umbilicus  Neurological:     Mental Status: She is alert.  Psychiatric:        Mood and Affect: Mood normal.        Behavior: Behavior normal.     Data Reviewed CLINICAL DATA:  Right adnexal cystic lesion on ultrasound. Premenopausal.  EXAM: MRI PELVIS WITHOUT AND WITH CONTRAST  TECHNIQUE: Multiplanar multisequence MR imaging of the pelvis was performed both before and after administration of intravenous contrast.  CONTRAST:  3mL GADAVIST GADOBUTROL 1 MMOL/ML IV SOLN  COMPARISON:  08/24/2019 and 01/10/2017  FINDINGS: Lower Urinary Tract: No urinary bladder or urethral abnormality identified.  Bowel: Unremarkable appearance of rectum and other pelvic bowel loops.  Vascular/Lymphatic: Unremarkable. No pathologically enlarged pelvic  lymph nodes identified.  Reproductive:  -- Uterus: Measures 8.1 x 4.6 x 6.0 cm (volume = 120 cm^3). No fibroids or other masses identified. Endometrial thickness measures 8 mm. Cervix and vagina are unremarkable.  -- Right ovary: A simple cyst is seen which measures 7.8 x 7.1 x 8.0 cm. No internal septations, mural nodules, or other soft tissue component seen. No evidence of contrast enhancement. This lesion shows gradual increase in size compared to 2018 ultrasound when it measured 4.6 cm in  maximum diameter.  -- Left ovary: Appears normal. No ovarian or adnexal masses identified.  Other: No peritoneal thickening or abnormal free fluid.  Musculoskeletal:  Unremarkable.  IMPRESSION: 8 cm simple right ovarian cyst, which shows gradual increase in size compared to ultrasound in 2018. 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 Surgical evaluation should be considered.  Normal appearance of uterus and left ovary.   Electronically Signed   By: Marlaine Hind M.D. 09/26/2019   Assessment Right ovarian cyst  Dysfunctional uterine bleeding  Normal ca125, persistent simple ovarian cyst Obesity  Plan Offered laparoscopy and cystectomy possible oophorectomy Patient desires surgical management .  The risks of surgery were discussed in detail with the patient including but not limited to: bleeding which may require transfusion or reoperation; infection which may require prolonged hospitalization or re-hospitalization and antibiotic therapy; injury to bowel, bladder, ureters and major vessels or other surrounding organs; formation of adhesions; need for additional procedures including laparotomy; thromboembolic phenomenon; incisional problems and other postoperative or anesthesia complications.  Patient was told that the likelihood that her condition and symptoms will be treated effectively with this surgical management was very high; the postoperative expectations were also discussed in detail. The patient also understands the alternative treatment options which were discussed in full. All questions were answered.  She was told that she will be contacted by our surgical scheduler regarding the time and date of her surgery; routine preoperative instructions will be given to her by the preoperative nursing team.   She is aware of need for preoperative COVID testing and subsequent quarantine from time of test to time of surgery; she will be given  further preoperative instructions at that [place] screening visit.  Printed patient education handouts about the procedure were given to the patient to review at home.    [name] 10/17/2019, 11:59 AM

## 2019-10-17 NOTE — Progress Notes (Signed)
Patient ID: Heidi Bartlett, female   DOB: 01-17-1981, 39 y.o.   MRN: [id number]  No chief complaint on file. f/u MRI and h/o DUB  HPI Heidi Bartlett is a 39 y.o. female.  [id number] Patient's last menstrual period was 09/13/2019 (within days). No vaginal bleeding while taking Megace and she wishes to continue this for now HPI  History reviewed. No pertinent past medical history.  Past Surgical History:  Procedure Laterality Date  . CESAREAN SECTION  2004  . CHOLECYSTECTOMY  2004  Ex lap possible tubal ectopic pregnancy History reviewed. No pertinent family history.  Social History Social History   Tobacco Use  . Smoking status: Current Every Day Smoker    Packs/day: 0.50    Types: Cigarettes  . Smokeless tobacco: Never Used  Vaping Use  . Vaping Use: Never used  Substance Use Topics  . Alcohol use: Not Currently  . Drug use: Not Currently    Allergies  Allergen Reactions  . Codeine Hives    Current Outpatient Medications  Medication Sig Dispense Refill  . Biotin w/ Vitamins C & E (HAIR/SKIN/NAILS PO) Take by mouth.    . clotrimazole (LOTRIMIN) 1 % external solution Apply 1 application topically 2 (two) times daily. 30 mL 0  . cyclobenzaprine (FLEXERIL) 10 MG tablet Take 1 tablet (10 mg total) by mouth at bedtime. 30 tablet 2  . folic acid (FOLVITE) 482 MCG tablet Take 400 mcg by mouth daily.    . megestrol (MEGACE) 40 MG tablet Take 1 tablet (40 mg total) by mouth 2 (two) times daily. 60 tablet 1  . metFORMIN (GLUCOPHAGE) 500 MG tablet Take 1 tablet (500 mg total) by mouth daily with breakfast. 30 tablet 5  . Multiple Vitamins-Minerals (WOMENS MULTI VITAMIN & MINERAL PO) Take by mouth.    . spironolactone (ALDACTONE) 100 MG tablet Take 0.5 tablets (50 mg total) by mouth daily. Will start with 50 mg twice daily, and increase to 100 mg twice daily as needed 30 tablet 4   Current Facility-Administered Medications  Medication Dose Route Frequency Provider Last  Rate Last Admin  . clotrimazole (LOTRIMIN) 1 % topical solution   Topical BID Woodroe Mode, MD        Review of Systems Review of Systems  Constitutional: Negative.   Respiratory: Negative.   Gastrointestinal: Negative.   Genitourinary: Positive for pelvic pain. Negative for vaginal bleeding and vaginal discharge.    Blood pressure 128/81, pulse 99, weight 246 lb 12.8 oz (111.9 kg), last menstrual period 09/13/2019.  Physical Exam Physical Exam Constitutional:      Appearance: She is obese. She is not ill-appearing.  Pulmonary:     Effort: Pulmonary effort is normal.  Abdominal:     Comments: Midline surgical scar below umbilicus  Neurological:     Mental Status: She is alert.  Psychiatric:        Mood and Affect: Mood normal.        Behavior: Behavior normal.     Data Reviewed CLINICAL DATA:  Right adnexal cystic lesion on ultrasound. Premenopausal.  EXAM: MRI PELVIS WITHOUT AND WITH CONTRAST  TECHNIQUE: Multiplanar multisequence MR imaging of the pelvis was performed both before and after administration of intravenous contrast.  CONTRAST:  22mL GADAVIST GADOBUTROL 1 MMOL/ML IV SOLN  COMPARISON:  08/24/2019 and 01/10/2017  FINDINGS: Lower Urinary Tract: No urinary bladder or urethral abnormality identified.  Bowel: Unremarkable appearance of rectum and other pelvic bowel loops.  Vascular/Lymphatic: Unremarkable. No pathologically enlarged pelvic  lymph nodes identified.  Reproductive:  -- Uterus: Measures 8.1 x 4.6 x 6.0 cm (volume = 120 cm^3). No fibroids or other masses identified. Endometrial thickness measures 8 mm. Cervix and vagina are unremarkable.  -- Right ovary: A simple cyst is seen which measures 7.8 x 7.1 x 8.0 cm. No internal septations, mural nodules, or other soft tissue component seen. No evidence of contrast enhancement. This lesion shows gradual increase in size compared to 2018 ultrasound when it measured 4.6 cm in  maximum diameter.  -- Left ovary: Appears normal. No ovarian or adnexal masses identified.  Other: No peritoneal thickening or abnormal free fluid.  Musculoskeletal:  Unremarkable.  IMPRESSION: 8 cm simple right ovarian cyst, which shows gradual increase in size compared to ultrasound in 2018. 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 Surgical evaluation should be considered.  Normal appearance of uterus and left ovary.   Electronically Signed   By: Marlaine Hind M.D. 09/26/2019   Assessment Right ovarian cyst  Dysfunctional uterine bleeding  Normal ca125, persistent simple ovarian cyst Obesity  Plan Offered laparoscopy and cystectomy possible oophorectomy Patient desires surgical management .  The risks of surgery were discussed in detail with the patient including but not limited to: bleeding which may require transfusion or reoperation; infection which may require prolonged hospitalization or re-hospitalization and antibiotic therapy; injury to bowel, bladder, ureters and major vessels or other surrounding organs; formation of adhesions; need for additional procedures including laparotomy; thromboembolic phenomenon; incisional problems and other postoperative or anesthesia complications.  Patient was told that the likelihood that her condition and symptoms will be treated effectively with this surgical management was very high; the postoperative expectations were also discussed in detail. The patient also understands the alternative treatment options which were discussed in full. All questions were answered.  She was told that she will be contacted by our surgical scheduler regarding the time and date of her surgery; routine preoperative instructions will be given to her by the preoperative nursing team.   She is aware of need for preoperative COVID testing and subsequent quarantine from time of test to time of surgery; she will be given  further preoperative instructions at that [place] screening visit.  Printed patient education handouts about the procedure were given to the patient to review at home.    [name] 10/17/2019, 11:59 AM

## 2019-10-17 NOTE — Patient Instructions (Signed)
Diagnostic Laparoscopy Diagnostic laparoscopy is a procedure to diagnose diseases in the abdomen. It might be done for a variety of reasons, such as to look for scar tissue, cancer, or a reason for abdomen (abdominal) pain. During the procedure, a thin, flexible tube that has a light and a camera on the end (laparoscope) is inserted through an incision in the abdomen. The image from the camera is shown on a monitor to help your surgeon see inside your body. Tell a health care provider about:  Any allergies you have.  All medicines you are taking, including vitamins, herbs, eye drops, creams, and over-the-counter medicines.  Any problems you or family members have had with anesthetic medicines.  Any blood disorders you have.  Any surgeries you have had.  Any medical conditions you have. What are the risks? Generally, this is a safe procedure. However, problems may occur, including:  Infection.  Bleeding.  Allergic reactions to medicines or dyes.  Damage to abdominal structures or organs, such as the intestines, liver, stomach, or spleen. What happens before the procedure? Medicines  Ask your health care provider about: ? Changing or stopping your regular medicines. This is especially important if you are taking diabetes medicines or blood thinners. ? Taking medicines such as aspirin and ibuprofen. These medicines can thin your blood. Do not take these medicines unless your health care provider tells you to take them. ? Taking over-the-counter medicines, vitamins, herbs, and supplements.  You may be given antibiotic medicine to help prevent infection. Staying hydrated Follow instructions from your health care provider about hydration, which may include:  Up to 2 hours before the procedure - you may continue to drink clear liquids, such as water, clear fruit juice, black coffee, and plain tea. Eating and drinking restrictions Follow instructions from your health care provider  about eating and drinking, which may include:  8 hours before the procedure - stop eating heavy meals or foods such as meat, fried foods, or fatty foods.  6 hours before the procedure - stop eating light meals or foods, such as toast or cereal.  6 hours before the procedure - stop drinking milk or drinks that contain milk.  2 hours before the procedure - stop drinking clear liquids. General instructions  Ask your health care provider how your surgical site will be marked or identified.  You may be asked to shower with a germ-killing soap.  Plan to have someone take you home from the hospital or clinic.  Plan to have a responsible adult care for you for at least 24 hours after you leave the hospital or clinic. This is important. What happens during the procedure?   To lower your risk of infection: ? Your health care team will wash or sanitize their hands. ? Hair may be removed from the surgical area. ? Your skin will be washed with soap.  An IV will be inserted into one of your veins.  You will be given a medicine to make you fall asleep (general anesthetic). You may also be given a medicine to help you relax (sedative).  A breathing tube will be placed down your throat to help you breathe during the procedure.  Your abdomen will be filled with an air-like gas so it expands. This will give the surgeon more room to operate and will make your organs easier to see.  Many small incisions will be made in your abdomen.  A laparoscope and other surgical instruments will be inserted into your abdomen through the   incisions.  A tissue sample may be removed from an organ for examination (biopsy). This will depend on the reason why you are having this procedure.  The laparoscope and other instruments will be removed from your abdomen.  The gas will be released.  Your incisions will be closed with stitches (sutures) and covered with a bandage (dressing).  Your breathing tube will be  removed. The procedure may vary among health care providers and hospitals. What happens after the procedure?   Your blood pressure, heart rate, breathing rate, and blood oxygen level will be monitored until the medicines you were given have worn off.  Do not drive for 24 hours if you were given a sedative during your procedure.  It is up to you to get the results of your procedure. Ask your health care provider, or the department that is doing the procedure, when your results will be ready. Summary  Diagnostic laparoscopy is a way to look for problems in the abdomen using small incisions.  Follow instructions from your health care provider about how to prepare for the procedure.  Plan to have a responsible adult care for you for at least 24 hours after you leave the hospital or clinic. This is important. This information is not intended to replace advice given to you by your health care provider. Make sure you discuss any questions you have with your health care provider. Document Revised: 01/16/2017 Document Reviewed: 07/30/2016 Elsevier Patient Education  2020 [employer]  

## 2019-10-19 MED FILL — MEGESTROL 40 MG TABLET: 40 | 30 days supply | Qty: 60 | Fill #1

## 2019-10-21 ENCOUNTER — Encounter: Payer: Self-pay | Admitting: Internal Medicine

## 2019-10-31 ENCOUNTER — Other Ambulatory Visit: Payer: Self-pay | Admitting: Obstetrics & Gynecology

## 2019-11-01 ENCOUNTER — Other Ambulatory Visit: Payer: Self-pay | Admitting: Obstetrics & Gynecology

## 2019-11-01 DIAGNOSIS — Z803 Family history of malignant neoplasm of breast: Secondary | ICD-10-CM

## 2019-11-08 MED FILL — METFORMIN HCL 500 MG TABS: 500 | 30 days supply | Qty: 30 | Fill #3

## 2019-11-08 MED FILL — CYCLOBENZAPRINE 10 MG TAB: 10 | 30 days supply | Qty: 30 | Fill #2

## 2019-11-09 ENCOUNTER — Encounter (HOSPITAL_BASED_OUTPATIENT_CLINIC_OR_DEPARTMENT_OTHER): Payer: Self-pay | Admitting: Obstetrics & Gynecology

## 2019-11-09 ENCOUNTER — Other Ambulatory Visit: Payer: Self-pay

## 2019-11-11 ENCOUNTER — Other Ambulatory Visit (HOSPITAL_COMMUNITY)
Admission: RE | Admit: 2019-11-11 | Discharge: 2019-11-11 | Disposition: A | Discharge status: Home / Self Care | Payer: 59 | Source: Ambulatory Visit | Attending: Obstetrics & Gynecology | Admitting: Obstetrics & Gynecology

## 2019-11-11 DIAGNOSIS — Z01812 Encounter for preprocedural laboratory examination: Secondary | ICD-10-CM | POA: Diagnosis present

## 2019-11-11 DIAGNOSIS — Z20822 Contact with and (suspected) exposure to covid-19: Secondary | ICD-10-CM | POA: Diagnosis not present

## 2019-11-11 LAB — SARS CORONAVIRUS 2 (TAT 6-24 HRS): SARS Coronavirus 2: NEGATIVE

## 2019-11-14 ENCOUNTER — Encounter (HOSPITAL_BASED_OUTPATIENT_CLINIC_OR_DEPARTMENT_OTHER)
Admission: RE | Admit: 2019-11-14 | Discharge: 2019-11-14 | Disposition: A | Discharge status: Home / Self Care | Payer: 59 | Source: Ambulatory Visit | Attending: Obstetrics & Gynecology | Admitting: Obstetrics & Gynecology

## 2019-11-14 DIAGNOSIS — Z79899 Other long term (current) drug therapy: Secondary | ICD-10-CM | POA: Diagnosis not present

## 2019-11-14 DIAGNOSIS — N938 Other specified abnormal uterine and vaginal bleeding: Secondary | ICD-10-CM | POA: Diagnosis not present

## 2019-11-14 DIAGNOSIS — Z01812 Encounter for preprocedural laboratory examination: Secondary | ICD-10-CM | POA: Diagnosis not present

## 2019-11-14 DIAGNOSIS — N8301 Follicular cyst of right ovary: Secondary | ICD-10-CM | POA: Diagnosis not present

## 2019-11-14 DIAGNOSIS — N83201 Unspecified ovarian cyst, right side: Secondary | ICD-10-CM | POA: Diagnosis present

## 2019-11-14 DIAGNOSIS — D270 Benign neoplasm of right ovary: Secondary | ICD-10-CM | POA: Diagnosis not present

## 2019-11-14 DIAGNOSIS — Z7984 Long term (current) use of oral hypoglycemic drugs: Secondary | ICD-10-CM | POA: Diagnosis not present

## 2019-11-14 DIAGNOSIS — E669 Obesity, unspecified: Secondary | ICD-10-CM | POA: Diagnosis not present

## 2019-11-14 DIAGNOSIS — F17210 Nicotine dependence, cigarettes, uncomplicated: Secondary | ICD-10-CM | POA: Diagnosis not present

## 2019-11-14 DIAGNOSIS — Z6836 Body mass index (BMI) 36.0-36.9, adult: Secondary | ICD-10-CM | POA: Diagnosis not present

## 2019-11-14 DIAGNOSIS — Z885 Allergy status to narcotic agent status: Secondary | ICD-10-CM | POA: Diagnosis not present

## 2019-11-14 LAB — TYPE AND SCREEN
ABO/RH(D): A POS
Antibody Screen: NEGATIVE

## 2019-11-14 LAB — BASIC METABOLIC PANEL
Anion gap: 12 (ref 5–15)
BUN: 6 mg/dL (ref 6–20)
CO2: 23 mmol/L (ref 22–32)
Calcium: 9.7 mg/dL (ref 8.9–10.3)
Chloride: 105 mmol/L (ref 98–111)
Creatinine, Ser: 0.84 mg/dL (ref 0.44–1.00)
GFR calc Af Amer: >60 mL/min (ref >60)
GFR calc non Af Amer: >60 mL/min (ref >60)
Glucose, Bld: 123 mg/dL — ABNORMAL HIGH (ref 70–99)
Potassium: 4.1 mmol/L (ref 3.5–5.1)
Sodium: 140 mmol/L (ref 135–145)

## 2019-11-14 LAB — POCT PREGNANCY, URINE: Preg Test, Ur: NEGATIVE

## 2019-11-14 NOTE — Progress Notes (Signed)

## 2019-11-15 ENCOUNTER — Encounter (HOSPITAL_BASED_OUTPATIENT_CLINIC_OR_DEPARTMENT_OTHER): Payer: Self-pay | Admitting: Obstetrics & Gynecology

## 2019-11-15 ENCOUNTER — Ambulatory Visit (HOSPITAL_BASED_OUTPATIENT_CLINIC_OR_DEPARTMENT_OTHER): Payer: 59 | Admitting: Anesthesiology

## 2019-11-15 ENCOUNTER — Other Ambulatory Visit: Payer: Self-pay

## 2019-11-15 ENCOUNTER — Encounter (HOSPITAL_BASED_OUTPATIENT_CLINIC_OR_DEPARTMENT_OTHER)
Admission: RE | Disposition: A | Discharge status: Home / Self Care | Payer: Self-pay | Source: Home / Self Care | Attending: Obstetrics & Gynecology

## 2019-11-15 ENCOUNTER — Ambulatory Visit (HOSPITAL_BASED_OUTPATIENT_CLINIC_OR_DEPARTMENT_OTHER)
Admission: RE | Admit: 2019-11-15 | Discharge: 2019-11-15 | Disposition: A | Discharge status: Home / Self Care | Payer: 59 | Attending: Obstetrics & Gynecology | Admitting: Obstetrics & Gynecology

## 2019-11-15 DIAGNOSIS — D270 Benign neoplasm of right ovary: Secondary | ICD-10-CM | POA: Insufficient documentation

## 2019-11-15 DIAGNOSIS — Z7984 Long term (current) use of oral hypoglycemic drugs: Secondary | ICD-10-CM | POA: Diagnosis not present

## 2019-11-15 DIAGNOSIS — Z6836 Body mass index (BMI) 36.0-36.9, adult: Secondary | ICD-10-CM | POA: Insufficient documentation

## 2019-11-15 DIAGNOSIS — N83201 Unspecified ovarian cyst, right side: Secondary | ICD-10-CM | POA: Diagnosis not present

## 2019-11-15 DIAGNOSIS — Z79899 Other long term (current) drug therapy: Secondary | ICD-10-CM | POA: Insufficient documentation

## 2019-11-15 DIAGNOSIS — Z885 Allergy status to narcotic agent status: Secondary | ICD-10-CM | POA: Insufficient documentation

## 2019-11-15 DIAGNOSIS — N938 Other specified abnormal uterine and vaginal bleeding: Secondary | ICD-10-CM | POA: Insufficient documentation

## 2019-11-15 DIAGNOSIS — F17210 Nicotine dependence, cigarettes, uncomplicated: Secondary | ICD-10-CM | POA: Insufficient documentation

## 2019-11-15 DIAGNOSIS — E669 Obesity, unspecified: Secondary | ICD-10-CM | POA: Insufficient documentation

## 2019-11-15 DIAGNOSIS — N8301 Follicular cyst of right ovary: Secondary | ICD-10-CM | POA: Insufficient documentation

## 2019-11-15 HISTORY — DX: Abnormal uterine and vaginal bleeding, unspecified: N93.9

## 2019-11-15 HISTORY — DX: Prediabetes: R73.03

## 2019-11-15 HISTORY — DX: Anxiety disorder, unspecified: F41.9

## 2019-11-15 HISTORY — PX: LAPAROSCOPIC UNILATERAL SALPINGO OOPHERECTOMY: SHX5935

## 2019-11-15 LAB — ABO/RH: ABO/RH(D): A POS

## 2019-11-15 LAB — GLUCOSE, CAPILLARY
Glucose-Capillary: 97 mg/dL (ref 70–99)
Glucose-Capillary: 103 mg/dL — ABNORMAL HIGH (ref 70–99)

## 2019-11-15 SURGERY — SALPINGO-OOPHORECTOMY, UNILATERAL, LAPAROSCOPIC
Anesthesia: General | Site: Abdomen | Laterality: Right

## 2019-11-15 MED ORDER — FENTANYL CITRATE (PF) 100 MCG/2ML IJ SOLN
INTRAMUSCULAR | Status: AC
Start: 2019-11-15 — End: ?
  Filled 2019-11-15: qty 2

## 2019-11-15 MED ORDER — ONDANSETRON HCL 4 MG/2ML IJ SOLN
INTRAMUSCULAR | Status: AC
  Filled 2019-11-15: qty 2

## 2019-11-15 MED ORDER — KETOROLAC TROMETHAMINE 30 MG/ML IJ SOLN
30.0000 mg | Freq: Once | INTRAMUSCULAR | Status: AC
  Administered 2019-11-15: 30 mg via INTRAVENOUS

## 2019-11-15 MED ORDER — SUGAMMADEX SODIUM 500 MG/5ML IV SOLN
INTRAVENOUS | Status: AC
  Filled 2019-11-15: qty 5

## 2019-11-15 MED ORDER — DEXAMETHASONE SODIUM PHOSPHATE 10 MG/ML IJ SOLN
INTRAMUSCULAR | Status: AC
  Filled 2019-11-15: qty 1

## 2019-11-15 MED ORDER — ROCURONIUM BROMIDE 100 MG/10ML IV SOLN
INTRAVENOUS | Status: DC | PRN
  Administered 2019-11-15: 20 mg via INTRAVENOUS
  Administered 2019-11-15: 30 mg via INTRAVENOUS

## 2019-11-15 MED ORDER — PROPOFOL 10 MG/ML IV BOLUS
INTRAVENOUS | Status: AC
  Filled 2019-11-15: qty 20

## 2019-11-15 MED ORDER — ROCURONIUM BROMIDE 10 MG/ML (PF) SYRINGE
PREFILLED_SYRINGE | INTRAVENOUS | Status: AC
  Filled 2019-11-15: qty 10

## 2019-11-15 MED ORDER — DICLOFENAC SODIUM 75 MG PO TBEC: 75.0000 mg | DELAYED_RELEASE_TABLET | Freq: Two times a day (BID) | ORAL | 0 refills | Status: DC | PRN

## 2019-11-15 MED ORDER — DEXAMETHASONE SODIUM PHOSPHATE 4 MG/ML IJ SOLN
INTRAMUSCULAR | Status: DC | PRN
  Administered 2019-11-15: 10 mg via INTRAVENOUS

## 2019-11-15 MED ORDER — MIDAZOLAM HCL 5 MG/5ML IJ SOLN
INTRAMUSCULAR | Status: DC | PRN
  Administered 2019-11-15: 2 mg via INTRAVENOUS

## 2019-11-15 MED ORDER — TRAMADOL-ACETAMINOPHEN 37.5-325 MG PO TABS: 1.0000 | ORAL_TABLET | Freq: Four times a day (QID) | ORAL | 0 refills | Status: DC | PRN

## 2019-11-15 MED ORDER — KETOROLAC TROMETHAMINE 30 MG/ML IJ SOLN
INTRAMUSCULAR | Status: AC
  Filled 2019-11-15: qty 1

## 2019-11-15 MED ORDER — SUGAMMADEX SODIUM 500 MG/5ML IV SOLN
INTRAVENOUS | Status: DC | PRN
  Administered 2019-11-15: 500 mg via INTRAVENOUS

## 2019-11-15 MED ORDER — BUPIVACAINE HCL 0.25 % IJ SOLN
INTRAMUSCULAR | Status: DC | PRN
  Administered 2019-11-15: 19 mL

## 2019-11-15 MED ORDER — POVIDONE-IODINE 10 % EX SWAB: 2.0000 "application " | Freq: Once | CUTANEOUS | Status: DC

## 2019-11-15 MED ORDER — FENTANYL CITRATE (PF) 100 MCG/2ML IJ SOLN
INTRAMUSCULAR | Status: AC
  Filled 2019-11-15: qty 2

## 2019-11-15 MED ORDER — FENTANYL CITRATE (PF) 100 MCG/2ML IJ SOLN
25.0000 ug | INTRAMUSCULAR | Status: DC | PRN
  Administered 2019-11-15 (×3): 50 ug via INTRAVENOUS

## 2019-11-15 MED ORDER — LACTATED RINGERS IV SOLN: INTRAVENOUS | Status: DC

## 2019-11-15 MED ORDER — MIDAZOLAM HCL 2 MG/2ML IJ SOLN
INTRAMUSCULAR | Status: AC
  Filled 2019-11-15: qty 2

## 2019-11-15 MED ORDER — ONDANSETRON HCL 4 MG/2ML IJ SOLN
INTRAMUSCULAR | Status: DC | PRN
  Administered 2019-11-15: 4 mg via INTRAVENOUS

## 2019-11-15 MED ORDER — LIDOCAINE 2% (20 MG/ML) 5 ML SYRINGE
INTRAMUSCULAR | Status: AC
  Filled 2019-11-15: qty 5

## 2019-11-15 MED ORDER — SUCCINYLCHOLINE CHLORIDE 20 MG/ML IJ SOLN
INTRAMUSCULAR | Status: DC | PRN
  Administered 2019-11-15: 140 mg via INTRAVENOUS

## 2019-11-15 MED ORDER — FENTANYL CITRATE (PF) 100 MCG/2ML IJ SOLN
INTRAMUSCULAR | Status: DC | PRN
Start: 2019-11-15 — End: 2019-11-15
  Administered 2019-11-15 (×2): 50 ug via INTRAVENOUS
  Administered 2019-11-15: 100 ug via INTRAVENOUS

## 2019-11-15 MED ORDER — LIDOCAINE HCL (CARDIAC) PF 100 MG/5ML IV SOSY
PREFILLED_SYRINGE | INTRAVENOUS | Status: DC | PRN
  Administered 2019-11-15: 100 mg via INTRAVENOUS

## 2019-11-15 MED ORDER — PROPOFOL 10 MG/ML IV BOLUS
INTRAVENOUS | Status: DC | PRN
  Administered 2019-11-15: 200 mg via INTRAVENOUS

## 2019-11-15 SURGICAL SUPPLY — 44 items
ADH SKN CLS APL DERMABOND .7 (GAUZE/BANDAGES/DRESSINGS) ×2
BAG SPEC RTRVL LRG 6X4 10 (ENDOMECHANICALS) ×2
DERMABOND ADVANCED (GAUZE/BANDAGES/DRESSINGS) ×2
DERMABOND ADVANCED .7 DNX12 (GAUZE/BANDAGES/DRESSINGS) ×2 IMPLANT
DRSG OPSITE POSTOP 3X4 (GAUZE/BANDAGES/DRESSINGS) ×4 IMPLANT
DURAPREP 26ML APPLICATOR (WOUND CARE) ×4 IMPLANT
ELECT REM PT RETURN 9FT ADLT (ELECTROSURGICAL) ×4
ELECTRODE REM PT RTRN 9FT ADLT (ELECTROSURGICAL) ×2 IMPLANT
GAUZE 4X4 16PLY RFD (DISPOSABLE) ×4 IMPLANT
GLOVE BIO SURGEON STRL SZ 6.5 (GLOVE) ×3 IMPLANT
GLOVE BIO SURGEON STRL SZ7 (GLOVE) ×4 IMPLANT
GLOVE BIO SURGEONS STRL SZ 6.5 (GLOVE) ×1
GLOVE BIOGEL PI IND STRL 6.5 (GLOVE) ×4 IMPLANT
GLOVE BIOGEL PI IND STRL 7.0 (GLOVE) ×6 IMPLANT
GLOVE BIOGEL PI INDICATOR 6.5 (GLOVE) ×4
GLOVE BIOGEL PI INDICATOR 7.0 (GLOVE) ×6
GLOVE ECLIPSE 6.5 STRL STRAW (GLOVE) ×8 IMPLANT
GOWN STRL REUS W/ TWL XL LVL3 (GOWN DISPOSABLE) ×2 IMPLANT
GOWN STRL REUS W/TWL LRG LVL3 (GOWN DISPOSABLE) ×8 IMPLANT
GOWN STRL REUS W/TWL XL LVL3 (GOWN DISPOSABLE) ×4
NDL SAFETY ECLIPSE 18X1.5 (NEEDLE) IMPLANT
NEEDLE HYPO 18GX1.5 SHARP (NEEDLE)
NEEDLE INSUFFLATION 120MM (ENDOMECHANICALS) ×4 IMPLANT
NS IRRIG 1000ML POUR BTL (IV SOLUTION) ×4 IMPLANT
PACK LAPAROSCOPY BASIN (CUSTOM PROCEDURE TRAY) ×4 IMPLANT
PACK TRENDGUARD 450 HYBRID PRO (MISCELLANEOUS) ×2 IMPLANT
PAD OB MATERNITY 4.3X12.25 (PERSONAL CARE ITEMS) ×4 IMPLANT
PAD PREP 24X48 CUFFED NSTRL (MISCELLANEOUS) ×4 IMPLANT
POUCH SPECIMEN RETRIEVAL 10MM (ENDOMECHANICALS) ×4 IMPLANT
SET IRRIG TUBING LAPAROSCOPIC (IRRIGATION / IRRIGATOR) ×4 IMPLANT
SET TUBE SMOKE EVAC HIGH FLOW (TUBING) ×4 IMPLANT
SHEARS HARMONIC ACE PLUS 36CM (ENDOMECHANICALS) ×4 IMPLANT
SLEEVE SCD COMPRESS KNEE MED (MISCELLANEOUS) ×4 IMPLANT
SLEEVE SURGEON STRL (DRAPES) ×4 IMPLANT
SLEEVE XCEL OPT CAN 5 100 (ENDOMECHANICALS) ×4 IMPLANT
SUT VICRYL 0 UR6 27IN ABS (SUTURE) ×4 IMPLANT
SUT VICRYL 4-0 PS2 18IN ABS (SUTURE) ×4 IMPLANT
TOWEL GREEN STERILE FF (TOWEL DISPOSABLE) ×8 IMPLANT
TRENDGUARD 450 HYBRID PRO PACK (MISCELLANEOUS) ×4
TROCAR 12M 150ML BLUNT (TROCAR) ×4 IMPLANT
TROCAR 5M 150ML BLDLS (TROCAR) ×4 IMPLANT
TROCAR OPTI TIP 5M 100M (ENDOMECHANICALS) ×4 IMPLANT
TROCAR XCEL DIL TIP R 11M (ENDOMECHANICALS) ×4 IMPLANT
WARMER LAPAROSCOPE (MISCELLANEOUS) ×4 IMPLANT

## 2019-11-15 NOTE — Transfer of Care (Signed)
Immediate Anesthesia Transfer of Care Note  Patient: Heidi Bartlett  Procedure(s) Performed: LAPAROSCOPIC RIGHT SALPINGO OOPHORECTOMY AND RIGHT OVARIAN CYSTECTOMY (Right Abdomen)  Patient Location: PACU  Anesthesia Type:General  Level of Consciousness: drowsy, patient cooperative and responds to stimulation  Airway & Oxygen Therapy: Patient Spontanous Breathing and Patient connected to face mask oxygen  Post-op Assessment: Report given to RN and Post -op Vital signs reviewed and stable  Post vital signs: Reviewed and stable  Last Vitals:  Vitals Value Taken Time  BP    Temp    Pulse 110 11/15/19 1034  Resp    SpO2 99 % 11/15/19 1034  Vitals shown include unvalidated device data.  Last Pain:  Vitals:   11/15/19 0745  TempSrc: Oral  PainSc: 5          Complications: No complications documented.

## 2019-11-15 NOTE — Op Note (Signed)
Heidi Bartlett PROCEDURE DATE: 11/15/2019  PREOPERATIVE DIAGNOSES: right ovarian cyst POSTOPERATIVE DIAGNOSES: The same PROCEDURE: Laparoscopic right salpingoophorectomy SURGEON:  Woodroe Mode, MD ASSISTANT:  Dr. . Ugonna Anyanwu An experienced assistant was required given the standard of surgical care given the complexity of the case.  This assistant was needed for exposure, dissection, suctioning, retraction, instrument exchange, and for overall help during the procedure. ANESTHESIOLOGIST: Dr. Carlota Raspberry  INDICATIONS: 39 y.o. G3P0120 with aforementioned preoperative diagnoses here today for definitive surgical management.   Risks of surgery were discussed with the patient including but not limited to: bleeding which may require transfusion or reoperation; infection which may require antibiotics; injury to bowel, bladder, ureters or other surrounding organs; need for additional procedures including laparotomy or subsequent procedures secondary to abnormal pathology; thromboembolic phenomenon, incisional problems and other postoperative/anesthesia complications. Written informed consent was obtained.    FINDINGS:  Small uterus, right adnexa with large benign appearing 8 cm cyst. Normal left adnexa  .  No evidence of endometriosis, with omental adhesion the anterior abdominal wall, no other abdominal/pelvic abnormality.    ANESTHESIA:    General INTRAVENOUS FLUIDS: 1000 ml ESTIMATED BLOOD LOSS: 25 ml SPECIMENS:  rightovary and fallopian tube COMPLICATIONS: None immediate   PROCEDURE IN DETAIL:  The patient had sequential compression devices applied to her lower extremities while in the preoperative area.  She was then taken to the operating room where general anesthesia was administered and was found to be adequate.  She was placed in the dorsal lithotomy position, and was prepped and draped in a sterile manner.  A Foley catheter was inserted into her bladder and attached to constant  drainage and a uterine manipulator was then advanced into the uterus . After an adequate timeout was performed, attention was then turned to the patient's abdomen where a 5-mm skin incision was made in the umbilical fold.  The Optiview 5-mm trocar and sleeve were then advanced without difficulty with the laparoscope under direct visualization into the abdomen.  The abdomen was then insufflated with carbon dioxide gas.  Adequate pneumoperitoneum was obtained.  A survey of the patient's pelvis and abdomen revealed the findings above.  5-mm lower quadrant port was then placed under direct visualization on the left and a 12 mm port on the right. On the right side, the uteroovarian ligament was then clamped and transected with the Harmonic device.  The right infundibulopelvic ligament was also clamped and transected allowing for salpingooophorectomy.  Excellent hemostasis was noted. The specimen was then removed from the abdomen through the 12-mm port using an Endocatch bag, under direct visualization.  The operative site was surveyed, and it was found to be hemostatic.  No intraoperative injury to other surrounding organs was noted.  The abdomen was desufflated and all instruments were then removed from the patient's abdomen. The fascial incision of the umbilicus was closed with a 0 Vicryl figure of eight stitch.  All skin incisions were closed with 4-0 Vicryl subcuticular stitches and Dermabond.  The patient tolerated the procedure well.  Sponge, lap, and needle counts were correct times two.  The patient was then taken to the recovery room awake, extubated and in stable condition.  The patient will be discharged to home as per PACU criteria.  Routine postoperative instructions given.  She was prescribed Oxycodone, Ibuprofen.  She will follow up in the office in 2-3 weeks for postoperative evaluation.  Woodroe Mode, MD, [place], [facility], [place]  [facility]

## 2019-11-15 NOTE — Anesthesia Preprocedure Evaluation (Addendum)
Anesthesia Evaluation  Patient identified by MRN, date of birth, ID band Patient awake  General Assessment Comment:History noted CG  Reviewed: Allergy & Precautions, NPO status , Patient's Chart, lab work & pertinent test results  Airway Mallampati: II  TM Distance: >3 FB     Dental   Pulmonary Current Smoker and Patient abstained from smoking.,    breath sounds clear to auscultation       Cardiovascular negative cardio ROS   Rhythm:Regular Rate:Normal     Neuro/Psych    GI/Hepatic negative GI ROS, Neg liver ROS,   Endo/Other  negative endocrine ROS  Renal/GU negative Renal ROS     Musculoskeletal   Abdominal   Peds  Hematology   Anesthesia Other Findings   Reproductive/Obstetrics                             Anesthesia Physical Anesthesia Plan  ASA: II  Anesthesia Plan: General   Post-op Pain Management:    Induction: Intravenous  PONV Risk Score and Plan: 3 and Ondansetron, Dexamethasone and Midazolam  Airway Management Planned: Oral ETT  Additional Equipment:   Intra-op Plan:   Post-operative Plan:   Informed Consent: I have reviewed the patients History and Physical, chart, labs and discussed the procedure including the risks, benefits and alternatives for the proposed anesthesia with the patient or authorized representative who has indicated his/her understanding and acceptance.     Dental advisory given  Plan Discussed with: CRNA, Anesthesiologist and Surgeon  Anesthesia Plan Comments:         Anesthesia Quick Evaluation

## 2019-11-15 NOTE — Anesthesia Postprocedure Evaluation (Signed)
Anesthesia Post Note  Patient: Heidi Bartlett  Procedure(s) Performed: LAPAROSCOPIC RIGHT SALPINGO OOPHORECTOMY AND RIGHT OVARIAN CYSTECTOMY (Right Abdomen)     Patient location during evaluation: PACU Anesthesia Type: General Level of consciousness: awake Pain management: pain level controlled Vital Signs Assessment: post-procedure vital signs reviewed and stable Respiratory status: spontaneous breathing Cardiovascular status: stable Postop Assessment: no apparent nausea or vomiting Anesthetic complications: no   No complications documented.  Last Vitals:  Vitals:   11/15/19 1034 11/15/19 1045  BP: (!) 138/91 133/79  Pulse: (!) 110 93  Resp:  14  Temp: 36.4 C   SpO2: 99% 100%    Last Pain:  Vitals:   11/15/19 1045  TempSrc:   PainSc: Asleep                 Gabbie Marzo

## 2019-11-15 NOTE — Anesthesia Procedure Notes (Signed)
Procedure Name: Intubation Date/Time: 11/15/2019 8:57 AM Performed by: Glory Buff, CRNA Pre-anesthesia Checklist: Patient identified, Emergency Drugs available, Suction available and Patient being monitored Patient Re-evaluated:Patient Re-evaluated prior to induction Oxygen Delivery Method: Circle system utilized Preoxygenation: Pre-oxygenation with 100% oxygen Induction Type: IV induction Ventilation: Mask ventilation without difficulty Laryngoscope Size: Miller and 3 Grade View: Grade II Tube type: Oral Tube size: 7.0 mm Number of attempts: 1 Airway Equipment and Method: Stylet and Oral airway Placement Confirmation: ETT inserted through vocal cords under direct vision,  positive ETCO2 and breath sounds checked- equal and bilateral Secured at: 22 cm Tube secured with: Tape Dental Injury: Teeth and Oropharynx as per pre-operative assessment

## 2019-11-15 NOTE — Interval H&P Note (Signed)
History and Physical Interval Note:  11/15/2019 8:26 AM  Heidi Bartlett  has presented today for surgery, with the diagnosis of Rt. Ovarian Cyst.  The various methods of treatment have been discussed with the patient and family. After consideration of risks, benefits and other options for treatment, the patient has consented to  Procedure(s): LAPAROSCOPIC OVARIAN CYSTECTOMY AND POSSIBLE OOPHORECTOMY (Right) as a surgical intervention.  The patient's history has been reviewed, patient examined, no change in status, stable for surgery.  I have reviewed the patient's chart and labs.  Questions were answered to the patient's satisfaction.     [name]

## 2019-11-15 NOTE — Discharge Instructions (Signed)
No NSAIDS (Ibuprofen/Aleve, etc.) before 5pm today. Operative Laparoscopy, Care After This sheet gives you information about how to care for yourself after your procedure. Your health care provider may also give you more specific instructions. If you have problems or questions, contact your health care provider. What can I expect after the procedure? After the procedure, it is common to have:  Mild discomfort in the abdomen.  Sore throat. Women who have laparoscopy with pelvic examination may have mild cramping and fluid coming from the vagina for a few days after the procedure. Follow these instructions at home: Medicines  Take over-the-counter and prescription medicines only as told by your health care provider.  If you were prescribed an antibiotic medicine, take it as told by your health care provider. Do not stop taking the antibiotic even if you start to feel better. Driving  Do not drive for 24 hours if you were given a medicine to help you relax (sedative) during your procedure.  Do not drive or use heavy machinery while taking prescription pain medicine. Bathing  Do not take baths, swim, or use a hot tub until your health care provider approves. You may take showers. Incision care   Follow instructions from your health care provider about how to take care of your incisions. Make sure you: ? Wash your hands with soap and water before you change your bandage (dressing). If soap and water are not available, use hand sanitizer. ? Change your dressing as told by your health care provider. ? Leave stitches (sutures), skin glue, or adhesive strips in place. These skin closures may need to stay in place for 2 weeks or longer. If adhesive strip edges start to loosen and curl up, you may trim the loose edges. Do not remove adhesive strips completely unless your health care provider tells you to do that.  Check your incision areas every day for signs of infection. Check for: ? Redness,  swelling, or pain. ? Fluid or blood. ? Warmth. ? Pus or a bad smell. Activity  Return to your normal activities as told by your health care provider. Ask your health care provider what activities are safe for you.  Do not lift anything that is heavier than 10 lb (4.5 kg), or the limit that you are told, until your health care provider says that it is safe. General instructions  To prevent or treat constipation while you are taking prescription pain medicine, your health care provider may recommend that you: ? Drink enough fluid to keep your urine pale yellow. ? Take over-the-counter or prescription medicines. ? Eat foods that are high in fiber, such as fresh fruits and vegetables, whole grains, and beans. ? Limit foods that are high in fat and processed sugars, such as fried and sweet foods.  Do not use any products that contain nicotine or tobacco, such as cigarettes and e-cigarettes. If you need help quitting, ask your health care provider.  Keep all follow-up visits as told by your health care provider. This is important. Contact a health care provider if:  You develop shoulder pain.  You feel lightheaded or faint.  You are unable to pass gas or have a bowel movement.  You feel nauseous or you vomit.  You develop a rash.  You have redness, swelling, or pain around any incision.  You have fluid or blood coming from any incision.  Any incision feels warm to the touch.  You have pus or a bad smell coming from any incision.  You  have a fever or chills. Get help right away if:  You have severe pain.  You have vomiting that does not go away.  You have heavy bleeding from the vagina.  Any incision opens.  You have trouble breathing.  You have chest pain. Summary  After the procedure, it is common to have mild discomfort in the abdomen and a sore throat.  Check your incision areas every day for signs of infection.  Return to your normal activities as told by  your health care provider. Ask your health care provider what activities are safe for you. This information is not intended to replace advice given to you by your health care provider. Make sure you discuss any questions you have with your health care provider. Document Revised: 01/16/2017 Document Reviewed: 07/30/2016 Elsevier Patient Education  [place] Instructions  Activity: Get plenty of rest for the remainder of the day. A responsible individual must stay with you for 24 hours following the procedure.  For the next 24 hours, DO NOT: -Drive a car -Paediatric nurse -Drink alcoholic beverages -Take any medication unless instructed by your physician -Make any legal decisions or sign important papers.  Meals: Start with liquid foods such as gelatin or soup. Progress to regular foods as tolerated. Avoid greasy, spicy, heavy foods. If nausea and/or vomiting occur, drink only clear liquids until the nausea and/or vomiting subsides. Call your physician if vomiting continues.  Special Instructions/Symptoms: Your throat may feel dry or sore from the anesthesia or the breathing tube placed in your throat during surgery. If this causes discomfort, gargle with warm salt water. The discomfort should disappear within 24 hours.  If you had a scopolamine patch placed behind your ear for the management of post- operative nausea and/or vomiting:  1. The medication in the patch is effective for 72 hours, after which it should be removed.  Wrap patch in a tissue and discard in the trash. Wash hands thoroughly with soap and water. 2. You may remove the patch earlier than 72 hours if you experience unpleasant side effects which may include dry mouth, dizziness or visual disturbances. 3. Avoid touching the patch. Wash your hands with soap and water after contact with the patch.

## 2019-11-16 ENCOUNTER — Other Ambulatory Visit: Payer: Self-pay | Admitting: Obstetrics & Gynecology

## 2019-11-16 DIAGNOSIS — N83201 Unspecified ovarian cyst, right side: Secondary | ICD-10-CM

## 2019-11-16 LAB — SURGICAL PATHOLOGY

## 2019-11-16 MED ORDER — HYDROMORPHONE HCL 2 MG PO TABS: 2.0000 mg | ORAL_TABLET | ORAL | 0 refills | Status: DC | PRN

## 2019-11-17 ENCOUNTER — Encounter (HOSPITAL_BASED_OUTPATIENT_CLINIC_OR_DEPARTMENT_OTHER): Payer: Self-pay | Admitting: Obstetrics & Gynecology

## 2019-11-18 ENCOUNTER — Ambulatory Visit: Payer: 59 | Attending: Internal Medicine | Admitting: Family

## 2019-11-18 ENCOUNTER — Other Ambulatory Visit: Payer: Self-pay

## 2019-11-18 ENCOUNTER — Encounter: Payer: Self-pay | Admitting: Family

## 2019-11-18 ENCOUNTER — Other Ambulatory Visit: Payer: Self-pay | Admitting: Obstetrics & Gynecology

## 2019-11-18 VITALS — BP 107/76 | HR 100 | Temp 97.2°F | Resp 18 | Ht 69.0 in | Wt 252.6 lb

## 2019-11-18 DIAGNOSIS — N938 Other specified abnormal uterine and vaginal bleeding: Secondary | ICD-10-CM

## 2019-11-18 DIAGNOSIS — Z0000 Encounter for general adult medical examination without abnormal findings: Secondary | ICD-10-CM

## 2019-11-18 DIAGNOSIS — Z23 Encounter for immunization: Secondary | ICD-10-CM

## 2019-11-18 NOTE — Progress Notes (Signed)
Patient ID: Heidi Bartlett, female    DOB: 12-14-1980  MRN: [id number]  CC: Health assessment for insurance  Subjective: Heidi Bartlett is a 39 y.o. female with history of right ovarian cyst, sciatica of right side, dysfunctional uterine bleeding, and tobacco dependence who presents for health assessment for insurance. Patient reports she has [employer] States that if she has a health assessment completed by a primary provider that she will be eligible for a $100 incentive through her health insurance.  Patient Active Problem List   Diagnosis Date Noted  . Right ovarian cyst 10/17/2019  . Dysfunctional uterine bleeding 07/02/2018  . Sciatica of right side 11/24/2017  . Class 2 obesity due to excess calories without serious comorbidity with body mass index (BMI) of 39.0 to 39.9 in adult 11/24/2017  . Tobacco dependence 11/24/2017     Current Outpatient Medications on File Prior to Visit  Medication Sig Dispense Refill  . cyclobenzaprine (FLEXERIL) 10 MG tablet Take 1 tablet (10 mg total) by mouth at bedtime. 30 tablet 2  . folic acid (FOLVITE) 488 MCG tablet Take 400 mcg by mouth daily.    . megestrol (MEGACE) 40 MG tablet Take 1 tablet (40 mg total) by mouth 2 (two) times daily. 60 tablet 1  . metFORMIN (GLUCOPHAGE) 500 MG tablet Take 1 tablet (500 mg total) by mouth daily with breakfast. 30 tablet 5  . spironolactone (ALDACTONE) 100 MG tablet Take 0.5 tablets (50 mg total) by mouth daily. Will start with 50 mg twice daily, and increase to 100 mg twice daily as needed 30 tablet 4  . clotrimazole (LOTRIMIN) 1 % external solution Apply 1 application topically 2 (two) times daily. (Patient not taking: Reported on 11/18/2019) 30 mL 0  . diclofenac (VOLTAREN) 75 MG EC tablet Take 1 tablet (75 mg total) by mouth 2 (two) times daily as needed. (Patient not taking: Reported on 11/18/2019) 20 tablet 0  . HYDROmorphone (DILAUDID) 2 MG tablet Take 1 tablet (2 mg total) by  mouth every 4 (four) hours as needed for severe pain. (Patient not taking: Reported on 11/18/2019) 15 tablet 0  . Multiple Vitamins-Minerals (WOMENS MULTI VITAMIN & MINERAL PO) Take by mouth. (Patient not taking: Reported on 11/18/2019)    . traMADol-acetaminophen (ULTRACET) 37.5-325 MG tablet Take 1-2 tablets by mouth every 6 (six) hours as needed. (Patient not taking: Reported on 11/18/2019) 20 tablet 0   Current Facility-Administered Medications on File Prior to Visit  Medication Dose Route Frequency Provider Last Rate Last Admin  . clotrimazole (LOTRIMIN) 1 % topical solution   Topical BID Woodroe Mode, MD        Allergies  Allergen Reactions  . Codeine Hives  . Tape Rash    Social History   Socioeconomic History  . Marital status: Married    Spouse name: Not on file  . Number of children: 0  . Years of education: 9th grade  . Highest education level: Not on file  Occupational History  . Occupation: unemployed  Tobacco Use  . Smoking status: Current Every Day Smoker    Packs/day: 0.50    Types: Cigarettes  . Smokeless tobacco: Never Used  Vaping Use  . Vaping Use: Never used  Substance and Sexual Activity  . Alcohol use: Not Currently  . Drug use: Not Currently  . Sexual activity: Yes    Birth control/protection: None  Other Topics Concern  . Not on file  Social History Narrative  . Not  on file   Social Determinants of Health   Financial Resource Strain:   . Difficulty of Paying Living Expenses: Not on file  Food Insecurity: Food Insecurity Present  . Worried About Charity fundraiser in the Last Year: Sometimes true  . Ran Out of Food in the Last Year: Never true  Transportation Needs: No Transportation Needs  . Lack of Transportation (Medical): No  . Lack of Transportation (Non-Medical): No  Physical Activity:   . Days of Exercise per Week: Not on file  . Minutes of Exercise per Session: Not on file  Stress:   . Feeling of Stress : Not on file  Social  Connections:   . Frequency of Communication with Friends and Family: Not on file  . Frequency of Social Gatherings with Friends and Family: Not on file  . Attends Religious Services: Not on file  . Active Member of Clubs or Organizations: Not on file  . Attends Archivist Meetings: Not on file  . Marital Status: Not on file  Intimate Partner Violence:   . Fear of Current or Ex-Partner: Not on file  . Emotionally Abused: Not on file  . Physically Abused: Not on file  . Sexually Abused: Not on file    No family history on file.  Past Surgical History:  Procedure Laterality Date  . CESAREAN SECTION  2004  . CHOLECYSTECTOMY  2004  . LAPAROSCOPIC UNILATERAL SALPINGO OOPHERECTOMY Right 11/15/2019   Procedure: LAPAROSCOPIC RIGHT SALPINGO OOPHORECTOMY AND RIGHT OVARIAN CYSTECTOMY;  Surgeon: Woodroe Mode, MD;  Location: [facility];  Service: Gynecology;  Laterality: Right;    ROS: Review of Systems Negative except as stated above  PHYSICAL EXAM: LMP 11/09/2019 (Exact Date)   Vitals with BMI 11/18/2019 11/15/2019 11/15/2019  Height 5\' 9"  - -  Weight 252 lbs 10 oz - -  BMI 27.74 - -  Systolic 128 786 -  Diastolic 76 71 -  Pulse 767 100 90   Wt Readings from Last 3 Encounters:  11/18/19 252 lb 9.6 oz (114.6 kg)  11/15/19 245 lb 6 oz (111.3 kg)  10/17/19 246 lb 12.8 oz (111.9 kg)    Physical Exam General appearance - alert, well appearing, and in no distress, oriented to person, place, and time and overweight Mental status - alert, oriented to person, place, and time, normal mood, behavior, speech, dress, motor activity, and thought processes Neck - supple, no significant adenopathy Lymphatics - no palpable lymphadenopathy, no hepatosplenomegaly Chest - clear to auscultation, no wheezes, rales or rhonchi, symmetric air entry, no tachypnea, retractions or cyanosis Heart - normal rate, regular rhythm, normal S1, S2, no murmurs, rubs, clicks or  gallops Neurological - alert, oriented, normal speech, no focal findings or movement disorder noted  ASSESSMENT AND PLAN: 1. Periodic health assessment, general screening, adult: - Patient presents with health assessment for [facility] health insurance incentive.  - Patient has documentation for provider to sign during today's visit. RN will upload completed form into patients chart.  - Keep all scheduled appointments with primary physician.   2. Need for immunization against influenza: - Flu vaccine will be administered in clinic during today's visit. - Flu Vaccine QUAD 36+ mos IM  Patient was given the opportunity to ask questions.  Patient verbalized understanding of the plan and was able to repeat key elements of the plan. Patient was given clear instructions to go to Emergency Department or return to medical center if symptoms don't improve, worsen, or new  problems develop.The patient verbalized understanding.   Camillia Herter, NP

## 2019-11-18 NOTE — Patient Instructions (Signed)
Health Assessment Form today. Keep scheduled appointments with primary physician. Health Maintenance, Female Adopting a healthy lifestyle and getting preventive care are important in promoting health and wellness. Ask your health care provider about:  The right schedule for you to have regular tests and exams.  Things you can do on your own to prevent diseases and keep yourself healthy. What should I know about diet, weight, and exercise? Eat a healthy diet   Eat a diet that includes plenty of vegetables, fruits, low-fat dairy products, and lean protein.  Do not eat a lot of foods that are high in solid fats, added sugars, or sodium. Maintain a healthy weight Body mass index (BMI) is used to identify weight problems. It estimates body fat based on height and weight. Your health care provider can help determine your BMI and help you achieve or maintain a healthy weight. Get regular exercise Get regular exercise. This is one of the most important things you can do for your health. Most adults should:  Exercise for at least 150 minutes each week. The exercise should increase your heart rate and make you sweat (moderate-intensity exercise).  Do strengthening exercises at least twice a week. This is in addition to the moderate-intensity exercise.  Spend less time sitting. Even light physical activity can be beneficial. Watch cholesterol and blood lipids Have your blood tested for lipids and cholesterol at 38 years of age, then have this test every 5 years. Have your cholesterol levels checked more often if:  Your lipid or cholesterol levels are high.  You are older than 38 years of age.  You are at high risk for heart disease. What should I know about cancer screening? Depending on your health history and family history, you may need to have cancer screening at various ages. This may include screening for:  Breast cancer.  Cervical cancer.  Colorectal cancer.  Skin  cancer.  Lung cancer. What should I know about heart disease, diabetes, and high blood pressure? Blood pressure and heart disease  High blood pressure causes heart disease and increases the risk of stroke. This is more likely to develop in people who have high blood pressure readings, are of African descent, or are overweight.  Have your blood pressure checked: ? Every 3-5 years if you are 38-7 years of age. ? Every year if you are 54 years old or older. Diabetes Have regular diabetes screenings. This checks your fasting blood sugar level. Have the screening done:  Once every three years after age 74 if you are at a normal weight and have a low risk for diabetes.  More often and at a younger age if you are overweight or have a high risk for diabetes. What should I know about preventing infection? Hepatitis B If you have a higher risk for hepatitis B, you should be screened for this virus. Talk with your health care provider to find out if you are at risk for hepatitis B infection. Hepatitis C Testing is recommended for:  Everyone born from [birth year] through 1964.  Anyone with known risk factors for hepatitis C. Sexually transmitted infections (STIs)  Get screened for STIs, including gonorrhea and chlamydia, if: ? You are sexually active and are younger than 38 years of age. ? You are older than 38 years of age and your health care provider tells you that you are at risk for this type of infection. ? Your sexual activity has changed since you were last screened, and you are at increased risk  for chlamydia or gonorrhea. Ask your health care provider if you are at risk.  Ask your health care provider about whether you are at high risk for HIV. Your health care provider may recommend a prescription medicine to help prevent HIV infection. If you choose to take medicine to prevent HIV, you should first get tested for HIV. You should then be tested every 3 months for as long as you are taking  the medicine. Pregnancy  If you are about to stop having your period (premenopausal) and you may become pregnant, seek counseling before you get pregnant.  Take 400 to 800 micrograms (mcg) of folic acid every day if you become pregnant.  Ask for birth control (contraception) if you want to prevent pregnancy. Osteoporosis and menopause Osteoporosis is a disease in which the bones lose minerals and strength with aging. This can result in bone fractures. If you are 7 years old or older, or if you are at risk for osteoporosis and fractures, ask your health care provider if you should:  Be screened for bone loss.  Take a calcium or vitamin D supplement to lower your risk of fractures.  Be given hormone replacement therapy (HRT) to treat symptoms of menopause. Follow these instructions at home: Lifestyle  Do not use any products that contain nicotine or tobacco, such as cigarettes, e-cigarettes, and chewing tobacco. If you need help quitting, ask your health care provider.  Do not use street drugs.  Do not share needles.  Ask your health care provider for help if you need support or information about quitting drugs. Alcohol use  Do not drink alcohol if: ? Your health care provider tells you not to drink. ? You are pregnant, may be pregnant, or are planning to become pregnant.  If you drink alcohol: ? Limit how much you use to 0-1 drink a day. ? Limit intake if you are breastfeeding.  Be aware of how much alcohol is in your drink. In the [place] one drink equals one 12 oz bottle of beer (355 mL), one 5 oz glass of wine (148 mL), or one 1 oz glass of hard liquor (44 mL). General instructions  Schedule regular health, dental, and eye exams.  Stay current with your vaccines.  Tell your health care provider if: ? You often feel depressed. ? You have ever been abused or do not feel safe at home. Summary  Adopting a healthy lifestyle and getting preventive care are important in  promoting health and wellness.  Follow your health care provider's instructions about healthy diet, exercising, and getting tested or screened for diseases.  Follow your health care provider's instructions on monitoring your cholesterol and blood pressure. This information is not intended to replace advice given to you by your health care provider. Make sure you discuss any questions you have with your health care provider. Document Revised: 01/27/2018 Document Reviewed: 01/27/2018 Elsevier Patient Education  2020 [employer].

## 2019-11-18 NOTE — Progress Notes (Signed)
Wants flu vaccine today

## 2019-11-21 ENCOUNTER — Other Ambulatory Visit: Payer: Self-pay

## 2019-11-21 ENCOUNTER — Ambulatory Visit
Admission: RE | Admit: 2019-11-21 | Discharge: 2019-11-21 | Disposition: A | Discharge status: Home / Self Care | Payer: 59 | Source: Ambulatory Visit | Attending: Obstetrics & Gynecology | Admitting: Obstetrics & Gynecology

## 2019-11-21 DIAGNOSIS — Z803 Family history of malignant neoplasm of breast: Secondary | ICD-10-CM

## 2019-11-22 ENCOUNTER — Other Ambulatory Visit: Payer: Self-pay | Admitting: Obstetrics & Gynecology

## 2019-11-22 MED FILL — MEGESTROL 40 MG TABLET: 40 | 30 days supply | Qty: 60 | Fill #0

## 2019-11-22 NOTE — Telephone Encounter (Signed)
Patient requesting refill. Discussed with Dr. Roselie Awkward and refill approved and sent. Riyan Gavina,RN

## 2019-11-23 ENCOUNTER — Other Ambulatory Visit: Payer: Self-pay | Admitting: Lactation Services

## 2019-11-23 DIAGNOSIS — N938 Other specified abnormal uterine and vaginal bleeding: Secondary | ICD-10-CM

## 2019-11-23 MED ORDER — MEGESTROL ACETATE 40 MG PO TABS: 40.0000 mg | ORAL_TABLET | Freq: Two times a day (BID) | ORAL | 1 refills | Status: DC

## 2019-11-29 ENCOUNTER — Ambulatory Visit: Payer: 59 | Admitting: Internal Medicine

## 2019-11-29 MED FILL — SPIRONOLACTONE 100 MG TAB: 100 | 30 days supply | Qty: 60 | Fill #1

## 2019-12-09 ENCOUNTER — Other Ambulatory Visit: Payer: Self-pay | Admitting: Internal Medicine

## 2019-12-09 ENCOUNTER — Ambulatory Visit (INDEPENDENT_AMBULATORY_CARE_PROVIDER_SITE_OTHER): Payer: 59 | Admitting: Obstetrics & Gynecology

## 2019-12-09 ENCOUNTER — Encounter: Payer: Self-pay | Admitting: Obstetrics & Gynecology

## 2019-12-09 ENCOUNTER — Other Ambulatory Visit: Payer: Self-pay | Admitting: Family

## 2019-12-09 ENCOUNTER — Other Ambulatory Visit (HOSPITAL_COMMUNITY)
Admission: RE | Admit: 2019-12-09 | Discharge: 2019-12-09 | Disposition: A | Discharge status: Home / Self Care | Payer: 59 | Source: Ambulatory Visit | Attending: Obstetrics & Gynecology | Admitting: Obstetrics & Gynecology

## 2019-12-09 ENCOUNTER — Other Ambulatory Visit: Payer: Self-pay

## 2019-12-09 VITALS — BP 117/87 | HR 93 | Ht 69.0 in | Wt 242.7 lb

## 2019-12-09 DIAGNOSIS — B3731 Acute candidiasis of vulva and vagina: Secondary | ICD-10-CM

## 2019-12-09 DIAGNOSIS — B373 Candidiasis of vulva and vagina: Secondary | ICD-10-CM | POA: Insufficient documentation

## 2019-12-09 DIAGNOSIS — M5442 Lumbago with sciatica, left side: Secondary | ICD-10-CM

## 2019-12-09 DIAGNOSIS — G8929 Other chronic pain: Secondary | ICD-10-CM

## 2019-12-09 MED ORDER — FLUCONAZOLE 150 MG PO TABS: 150.0000 mg | ORAL_TABLET | Freq: Once | ORAL | 0 refills | Status: AC

## 2019-12-09 MED ORDER — NORGESTIMATE-ETH ESTRADIOL 0.25-35 MG-MCG PO TABS: 1.0000 | ORAL_TABLET | Freq: Every day | ORAL | 4 refills | Status: DC

## 2019-12-09 MED FILL — METFORMIN HCL 500 MG TABS: 500 | 30 days supply | Qty: 30 | Fill #4

## 2019-12-09 NOTE — Progress Notes (Signed)
Currently no bleeding. Pt feels that she has a yeast infection.

## 2019-12-09 NOTE — Patient Instructions (Signed)
Abnormal Uterine Bleeding Abnormal uterine bleeding means bleeding more than usual from your uterus. It can include:  Bleeding between periods.  Bleeding after sex.  Bleeding that is heavier than normal.  Periods that last longer than usual.  Bleeding after you have stopped having your period (menopause). There are many problems that may cause this. You should see a doctor for any kind of bleeding that is not normal. Treatment depends on the cause of the bleeding. Follow these instructions at home:  Watch your condition for any changes.  Do not use tampons, douche, or have sex, if your doctor tells you not to.  Change your pads often.  Get regular well-woman exams. Make sure they include a pelvic exam and cervical cancer screening.  Keep all follow-up visits as told by your doctor. This is important. Contact a doctor if:  The bleeding lasts more than one week.  You feel dizzy at times.  You feel like you are going to throw up (nauseous).  You throw up. Get help right away if:  You pass out.  You have to change pads every hour.  You have belly (abdominal) pain.  You have a fever.  You get sweaty.  You get weak.  You passing large blood clots from your vagina. Summary  Abnormal uterine bleeding means bleeding more than usual from your uterus.  There are many problems that may cause this. You should see a doctor for any kind of bleeding that is not normal.  Treatment depends on the cause of the bleeding. This information is not intended to replace advice given to you by your health care provider. Make sure you discuss any questions you have with your health care provider. Document Revised: 01/29/2016 Document Reviewed: 01/29/2016 Elsevier Patient Education  2020 [employer].

## 2019-12-09 NOTE — Progress Notes (Signed)
Subjective:     Heidi Bartlett is a 39 y.o. female who presents to the clinic 4 weeks status post right ovarian cystectomy and laparoscopy for adnexal mass. Eating a regular diet without difficulty. Bowel movements are normal. The patient is not having any pain.  The following portions of the patient's history were reviewed and updated as appropriate: allergies, current medications, past family history, past medical history, past social history, past surgical history and problem list.  Review of Systems she has had vaginal bleeding daily despite taking Megace and she would like cycle control    Objective:    BP 117/87 (BP Location: Right Arm)   Pulse 93   Ht 5\' 9"  (1.753 m)   Wt 242 lb 11.2 oz (110.1 kg)   LMP 11/15/2019   BMI 35.84 kg/m  General:  alert, cooperative and no distress  Abdomen: soft, non-tender  Incision:   healing well, no drainage, no erythema, no hernia, no seroma, no swelling, no dehiscence, incision well approximated     Assessment:    Postoperative course complicated by DUB. Her [place] was reviewed and no uterine pathology seen Operative findings again reviewed. Pathology report discussed.    Plan:    1. Continue any current medications. 2. Wound care discussed. 3. Activity restrictions: none 4. Anticipated return to work: now. 5. Follow up: 4 months. She will try to quit smoking and I prescribed Sprintec to teplace the Megace for cycle control   . Patient ID: Heidi Bartlett, female   DOB: 06-27-1980, 39 y.o.   MRN: [id number] Woodroe Mode, MD

## 2019-12-12 LAB — CERVICOVAGINAL ANCILLARY ONLY
Bacterial Vaginitis (gardnerella): NEGATIVE
Candida Glabrata: NEGATIVE
Candida Vaginitis: NEGATIVE
Comment: NEGATIVE
Comment: NEGATIVE
Comment: NEGATIVE

## 2019-12-12 MED FILL — CYCLOBENZAPRINE 10 MG TAB: 10 | 30 days supply | Qty: 30 | Fill #0

## 2019-12-19 ENCOUNTER — Ambulatory Visit: Payer: 59 | Attending: Internal Medicine | Admitting: Internal Medicine

## 2019-12-19 ENCOUNTER — Other Ambulatory Visit: Payer: Self-pay

## 2019-12-19 DIAGNOSIS — R0981 Nasal congestion: Secondary | ICD-10-CM

## 2019-12-19 NOTE — Progress Notes (Signed)
Virtual Visit via Telephone Note Due to current restrictions/limitations of in-office visits due to the COVID-19 pandemic, this scheduled clinical appointment was converted to a telehealth visit  I connected with Heidi Bartlett on 12/19/19 at 12:12 p.m by telephone and verified that I am speaking with the correct person using two identifiers.  Location: Patient: home Provider: office   I discussed the limitations, risks, security and privacy concerns of performing an evaluation and management service by telephone and the availability of in person appointments. I also discussed with the patient that there may be a patient responsible charge related to this service. The patient expressed understanding and agreed to proceed.   History of Present Illness: History of obesity, tobacco dependence, chronic right-sided lower back pain, DUB.  Pt reports that LT nostril was a little swollen and tender to touch that started 2 wks ago.  Associated with congestion.  Asked pharmacist about it and told to use OTC allergy nasal spray.  She did and symptoms have resolved.  Outpatient Encounter Medications as of 12/19/2019  Medication Sig  . clotrimazole (LOTRIMIN) 1 % external solution Apply 1 application topically 2 (two) times daily. (Patient not taking: Reported on 11/18/2019)  . cyclobenzaprine (FLEXERIL) 10 MG tablet TAKE 1 TABLET (10 MG TOTAL) BY MOUTH AT BEDTIME.  Marland Kitchen diclofenac (VOLTAREN) 75 MG EC tablet Take 1 tablet (75 mg total) by mouth 2 (two) times daily as needed. (Patient not taking: Reported on 11/18/2019)  . folic acid (FOLVITE) 277 MCG tablet Take 400 mcg by mouth daily.  Marland Kitchen HYDROmorphone (DILAUDID) 2 MG tablet Take 1 tablet (2 mg total) by mouth every 4 (four) hours as needed for severe pain. (Patient not taking: Reported on 11/18/2019)  . metFORMIN (GLUCOPHAGE) 500 MG tablet Take 1 tablet (500 mg total) by mouth daily with breakfast.  . Multiple Vitamins-Minerals (WOMENS MULTI VITAMIN &  MINERAL PO) Take by mouth. (Patient not taking: Reported on 11/18/2019)  . norgestimate-ethinyl estradiol (ORTHO-CYCLEN) 0.25-35 MG-MCG tablet Take 1 tablet by mouth daily.  Marland Kitchen spironolactone (ALDACTONE) 100 MG tablet Take 0.5 tablets (50 mg total) by mouth daily. Will start with 50 mg twice daily, and increase to 100 mg twice daily as needed  . traMADol-acetaminophen (ULTRACET) 37.5-325 MG tablet Take 1-2 tablets by mouth every 6 (six) hours as needed. (Patient not taking: Reported on 11/18/2019)   Facility-Administered Encounter Medications as of 12/19/2019  Medication  . clotrimazole (LOTRIMIN) 1 % topical solution      Observations/Objective: No direct observation done as this was a telephone encounter.  Assessment and Plan: 1. Nasal congestion Symptoms resolved with over-the-counter allergy nasal spray.  Advised patient to use the spray only when needed as prolonged use can sometimes cause nosebleeds.   Follow Up Instructions: -As needed   I discussed the assessment and treatment plan with the patient. The patient was provided an opportunity to ask questions and all were answered. The patient agreed with the plan and demonstrated an understanding of the instructions.   The patient was advised to call back or seek an in-person evaluation if the symptoms worsen or if the condition fails to improve as anticipated.  I provided 4 minutes of non-face-to-face time during this encounter.   Karle Plumber, MD

## 2019-12-19 NOTE — Progress Notes (Signed)
Pt states last week her nostril was swollen and she was unable to put her finger in there  Pt states the swelling has went down   Pt states she has been taking allergy relief

## 2019-12-20 ENCOUNTER — Ambulatory Visit (INDEPENDENT_AMBULATORY_CARE_PROVIDER_SITE_OTHER): Payer: 59 | Admitting: Lactation Services

## 2019-12-20 ENCOUNTER — Other Ambulatory Visit: Payer: Self-pay

## 2019-12-20 ENCOUNTER — Encounter: Payer: Self-pay | Admitting: Lactation Services

## 2019-12-20 DIAGNOSIS — Z4889 Encounter for other specified surgical aftercare: Secondary | ICD-10-CM | POA: Diagnosis not present

## 2019-12-20 MED ORDER — SULFAMETHOXAZOLE-TRIMETHOPRIM 800-160 MG PO TABS: 1.0000 | ORAL_TABLET | Freq: Two times a day (BID) | ORAL | 0 refills | Status: DC

## 2019-12-20 NOTE — Progress Notes (Addendum)
Patient here for wound check. She is post Hysteroscopy on 9/28. 3 days ago she experienced itching to incisions and burning to umbilicus incision. Area is reddened and painful to touch. Swelling about size of pencil eraser. Area with small amount purulent drainage. Earlie Server, NP in to assess. ATB ordered. Patient informed that OCP's may be less effective with ATB. Patient reports she uses for bleeding, informed her she may have some bleeding while taking but may not also.   Saw client with RN.  Had one bruised and red area approx 1 cm in diameter at base of umbilicus to left side.  Tender to palpation.  No active drainage, but tender.  Will treat with antibiotics.  Virginia Rochester, NP 12/20/2019 5:36 PM

## 2019-12-20 NOTE — Addendum Note (Signed)
Addended by: Virginia Rochester on: 12/20/2019 05:38 PM   Modules accepted: Level of Service

## 2020-01-02 ENCOUNTER — Encounter: Payer: Self-pay | Admitting: Internal Medicine

## 2020-01-03 ENCOUNTER — Other Ambulatory Visit: Payer: Self-pay

## 2020-01-03 ENCOUNTER — Ambulatory Visit: Payer: 59 | Attending: Internal Medicine | Admitting: Internal Medicine

## 2020-01-03 ENCOUNTER — Encounter: Payer: Self-pay | Admitting: Internal Medicine

## 2020-01-03 ENCOUNTER — Ambulatory Visit: Payer: 59 | Admitting: Internal Medicine

## 2020-01-03 VITALS — BP 130/81 | HR 118 | Resp 16 | Wt 243.4 lb

## 2020-01-03 DIAGNOSIS — E669 Obesity, unspecified: Secondary | ICD-10-CM

## 2020-01-03 DIAGNOSIS — R7303 Prediabetes: Secondary | ICD-10-CM

## 2020-01-03 DIAGNOSIS — L659 Nonscarring hair loss, unspecified: Secondary | ICD-10-CM

## 2020-01-03 DIAGNOSIS — F17200 Nicotine dependence, unspecified, uncomplicated: Secondary | ICD-10-CM | POA: Diagnosis not present

## 2020-01-03 DIAGNOSIS — L309 Dermatitis, unspecified: Secondary | ICD-10-CM

## 2020-01-03 NOTE — Patient Instructions (Signed)
You have been referred to the dermatologist for the bald spot on your scalp. Use the clotrimazole cream for the ringworm type rash on your right leg.  You should use it twice a day for about 7 days.  Try to exercise at least 3-4 times a week for 30 to 45 minutes.   Healthy Eating Following a healthy eating pattern may help you to achieve and maintain a healthy body weight, reduce the risk of chronic disease, and live a long and productive life. It is important to follow a healthy eating pattern at an appropriate calorie level for your body. Your nutritional needs should be met primarily through food by choosing a variety of nutrient-rich foods. What are tips for following this plan? Reading food labels  Read labels and choose the following: ? Reduced or low sodium. ? Juices with 100% fruit juice. ? Foods with low saturated fats and high polyunsaturated and monounsaturated fats. ? Foods with whole grains, such as whole wheat, cracked wheat, brown rice, and wild rice. ? Whole grains that are fortified with folic acid. This is recommended for women who are pregnant or who want to become pregnant.  Read labels and avoid the following: ? Foods with a lot of added sugars. These include foods that contain brown sugar, corn sweetener, corn syrup, dextrose, fructose, glucose, high-fructose corn syrup, honey, invert sugar, lactose, malt syrup, maltose, molasses, raw sugar, sucrose, trehalose, or turbinado sugar.  Do not eat more than the following amounts of added sugar per day:  6 teaspoons (25 g) for women.  9 teaspoons (38 g) for men. ? Foods that contain processed or refined starches and grains. ? Refined grain products, such as white flour, degermed cornmeal, white bread, and white rice. Shopping  Choose nutrient-rich snacks, such as vegetables, whole fruits, and nuts. Avoid high-calorie and high-sugar snacks, such as potato chips, fruit snacks, and candy.  Use oil-based dressings and  spreads on foods instead of solid fats such as butter, stick margarine, or cream cheese.  Limit pre-made sauces, mixes, and "instant" products such as flavored rice, instant noodles, and ready-made pasta.  Try more plant-protein sources, such as tofu, tempeh, black beans, edamame, lentils, nuts, and seeds.  Explore eating plans such as the Mediterranean diet or vegetarian diet. Cooking  Use oil to saut or stir-fry foods instead of solid fats such as butter, stick margarine, or lard.  Try baking, boiling, grilling, or broiling instead of frying.  Remove the fatty part of meats before cooking.  Steam vegetables in water or broth. Meal planning   At meals, imagine dividing your plate into fourths: ? One-half of your plate is fruits and vegetables. ? One-fourth of your plate is whole grains. ? One-fourth of your plate is protein, especially lean meats, poultry, eggs, tofu, beans, or nuts.  Include low-fat dairy as part of your daily diet. Lifestyle  Choose healthy options in all settings, including home, work, school, restaurants, or stores.  Prepare your food safely: ? Wash your hands after handling raw meats. ? Keep food preparation surfaces clean by regularly washing with hot, soapy water. ? Keep raw meats separate from ready-to-eat foods, such as fruits and vegetables. ? Cook seafood, meat, poultry, and eggs to the recommended internal temperature. ? Store foods at safe temperatures. In general:  Keep cold foods at 15F (4.4C) or below.  Keep hot foods at 115F (60C) or above.  Keep your freezer at [facility] (-17.8C) or below.  Foods are no longer safe to eat  when they have been between the temperatures of 40-140F (4.4-60C) for more than 2 hours. What foods should I eat? Fruits Aim to eat 2 cup-equivalents of fresh, canned (in natural juice), or frozen fruits each day. Examples of 1 cup-equivalent of fruit include 1 small apple, 8 large strawberries, 1 cup canned fruit,   cup dried fruit, or 1 cup 100% juice. Vegetables Aim to eat 2-3 cup-equivalents of fresh and frozen vegetables each day, including different varieties and colors. Examples of 1 cup-equivalent of vegetables include 2 medium carrots, 2 cups raw, leafy greens, 1 cup chopped vegetable (raw or cooked), or 1 medium baked potato. Grains Aim to eat 6 ounce-equivalents of whole grains each day. Examples of 1 ounce-equivalent of grains include 1 slice of bread, 1 cup ready-to-eat cereal, 3 cups popcorn, or  cup cooked rice, pasta, or cereal. Meats and other proteins Aim to eat 5-6 ounce-equivalents of protein each day. Examples of 1 ounce-equivalent of protein include 1 egg, 1/2 cup nuts or seeds, or 1 tablespoon (16 g) peanut butter. A cut of meat or fish that is the size of a deck of cards is about 3-4 ounce-equivalents.  Of the protein you eat each week, try to have at least 8 ounces come from seafood. This includes salmon, trout, herring, and anchovies. Dairy Aim to eat 3 cup-equivalents of fat-free or low-fat dairy each day. Examples of 1 cup-equivalent of dairy include 1 cup (240 mL) milk, 8 ounces (250 g) yogurt, 1 ounces (44 g) natural cheese, or 1 cup (240 mL) fortified soy milk. Fats and oils  Aim for about 5 teaspoons (21 g) per day. Choose monounsaturated fats, such as canola and olive oils, avocados, peanut butter, and most nuts, or polyunsaturated fats, such as sunflower, corn, and soybean oils, walnuts, pine nuts, sesame seeds, sunflower seeds, and flaxseed. Beverages  Aim for six 8-oz glasses of water per day. Limit coffee to three to five 8-oz cups per day.  Limit caffeinated beverages that have added calories, such as soda and energy drinks.  Limit alcohol intake to no more than 1 drink a day for nonpregnant women and 2 drinks a day for men. One drink equals 12 oz of beer (355 mL), 5 oz of wine (148 mL), or 1 oz of hard liquor (44 mL). Seasoning and other foods  Avoid adding  excess amounts of salt to your foods. Try flavoring foods with herbs and spices instead of salt.  Avoid adding sugar to foods.  Try using oil-based dressings, sauces, and spreads instead of solid fats. This information is based on general [place] nutrition guidelines. For more information, visit Build[employer].es. Exact amounts may vary based on your nutrition needs. Summary  A healthy eating plan may help you to maintain a healthy weight, reduce the risk of chronic diseases, and stay active throughout your life.  Plan your meals. Make sure you eat the right portions of a variety of nutrient-rich foods.  Try baking, boiling, grilling, or broiling instead of frying.  Choose healthy options in all settings, including home, work, school, restaurants, or stores. This information is not intended to replace advice given to you by your health care provider. Make sure you discuss any questions you have with your health care provider. Document Revised: 05/18/2017 Document Reviewed: 05/18/2017 Elsevier Patient Education  [place].

## 2020-01-03 NOTE — Progress Notes (Signed)
Patient ID: Heidi Bartlett, female    DOB: 11-Dec-1980  MRN: [id number]  CC: Rash   Subjective: Heidi Bartlett is a 39 y.o. female who presents for UC visit Her concerns today include:  History of obesity, tobacco dependence, chronic right-sided lower back pain, DUB.  C/o having a dry patch on lower back x 2 mths Asked her GYN about it when she was seen in the end of last month.  He thought it was a fungal infection and had called in clotrimazole solution but patient's pharmacy did not carry it.  She purchased the clotrimazole over-the-counter.  Rash resolved.  However 3 days ago she noticed a new rash on the right upper posterior thigh.  Rash is itchy.  She applied some of the Chlortrimazole cream 1 time and the itching stopped but the rash persists.  Denies any recent changes in body products.  No one else in the house with rash.  Also c/o hair loss x few mths. Has a bald spot on the top of her head.  She is not sure what may be causing it.  She does not use any chemicals like perms or dyes in her hair.  Tob Dep:  Down to 3-4 cigarettes a day.  Not trying to quit and not ready to quit mentally.    Obesity: Since last visit with me she states she has cut out drinking monster drinks completely.  She is also cut back on eating bread and pasta.  Not getting in much exercise.  A1c earlier this year was in the range for prediabetes. Patient Active Problem List   Diagnosis Date Noted  . Right ovarian cyst 10/17/2019  . Dysfunctional uterine bleeding 07/02/2018  . Sciatica of right side 11/24/2017  . Class 2 obesity due to excess calories without serious comorbidity with body mass index (BMI) of 39.0 to 39.9 in adult 11/24/2017  . Tobacco dependence 11/24/2017     Current Outpatient Medications on File Prior to Visit  Medication Sig Dispense Refill  . clotrimazole (LOTRIMIN) 1 % external solution Apply 1 application topically 2 (two) times daily. (Patient not taking: Reported on  11/18/2019) 30 mL 0  . cyclobenzaprine (FLEXERIL) 10 MG tablet TAKE 1 TABLET (10 MG TOTAL) BY MOUTH AT BEDTIME. 30 tablet 2  . diclofenac (VOLTAREN) 75 MG EC tablet Take 1 tablet (75 mg total) by mouth 2 (two) times daily as needed. (Patient not taking: Reported on 11/18/2019) 20 tablet 0  . folic acid (FOLVITE) 355 MCG tablet Take 400 mcg by mouth daily.    Marland Kitchen HYDROmorphone (DILAUDID) 2 MG tablet Take 1 tablet (2 mg total) by mouth every 4 (four) hours as needed for severe pain. (Patient not taking: Reported on 11/18/2019) 15 tablet 0  . metFORMIN (GLUCOPHAGE) 500 MG tablet Take 1 tablet (500 mg total) by mouth daily with breakfast. 30 tablet 5  . Multiple Vitamins-Minerals (WOMENS MULTI VITAMIN & MINERAL PO) Take by mouth. (Patient not taking: Reported on 11/18/2019)    . norgestimate-ethinyl estradiol (ORTHO-CYCLEN) 0.25-35 MG-MCG tablet Take 1 tablet by mouth daily. 28 tablet 4  . spironolactone (ALDACTONE) 100 MG tablet Take 0.5 tablets (50 mg total) by mouth daily. Will start with 50 mg twice daily, and increase to 100 mg twice daily as needed 30 tablet 4  . sulfamethoxazole-trimethoprim (BACTRIM DS) 800-160 MG tablet Take 1 tablet by mouth 2 (two) times daily. 14 tablet 0  . traMADol-acetaminophen (ULTRACET) 37.5-325 MG tablet Take 1-2 tablets by mouth every 6 (  six) hours as needed. (Patient not taking: Reported on 11/18/2019) 20 tablet 0   Current Facility-Administered Medications on File Prior to Visit  Medication Dose Route Frequency Provider Last Rate Last Admin  . clotrimazole (LOTRIMIN) 1 % topical solution   Topical BID Woodroe Mode, MD        Allergies  Allergen Reactions  . Dilaudid [Hydromorphone] Itching  . Codeine Hives  . Tape Rash    Social History   Socioeconomic History  . Marital status: Married    Spouse name: Not on file  . Number of children: 0  . Years of education: 9th grade  . Highest education level: Not on file  Occupational History  . Occupation:  unemployed  Tobacco Use  . Smoking status: Current Every Day Smoker    Packs/day: 0.50    Types: Cigarettes  . Smokeless tobacco: Never Used  Vaping Use  . Vaping Use: Never used  Substance and Sexual Activity  . Alcohol use: Not Currently  . Drug use: Not Currently  . Sexual activity: Yes    Birth control/protection: None  Other Topics Concern  . Not on file  Social History Narrative  . Not on file   Social Determinants of Health   Financial Resource Strain:   . Difficulty of Paying Living Expenses: Not on file  Food Insecurity: No Food Insecurity  . Worried About Charity fundraiser in the Last Year: Never true  . Ran Out of Food in the Last Year: Never true  Transportation Needs: No Transportation Needs  . Lack of Transportation (Medical): No  . Lack of Transportation (Non-Medical): No  Physical Activity:   . Days of Exercise per Week: Not on file  . Minutes of Exercise per Session: Not on file  Stress:   . Feeling of Stress : Not on file  Social Connections:   . Frequency of Communication with Friends and Family: Not on file  . Frequency of Social Gatherings with Friends and Family: Not on file  . Attends Religious Services: Not on file  . Active Member of Clubs or Organizations: Not on file  . Attends Archivist Meetings: Not on file  . Marital Status: Not on file  Intimate Partner Violence:   . Fear of Current or Ex-Partner: Not on file  . Emotionally Abused: Not on file  . Physically Abused: Not on file  . Sexually Abused: Not on file    Family History  Problem Relation Age of Onset  . Breast cancer Mother     Past Surgical History:  Procedure Laterality Date  . CESAREAN SECTION  2004  . CHOLECYSTECTOMY  2004  . LAPAROSCOPIC UNILATERAL SALPINGO OOPHERECTOMY Right 11/15/2019   Procedure: LAPAROSCOPIC RIGHT SALPINGO OOPHORECTOMY AND RIGHT OVARIAN CYSTECTOMY;  Surgeon: Woodroe Mode, MD;  Location: [place];  Service:  Gynecology;  Laterality: Right;    ROS: Review of Systems Negative except as stated above  PHYSICAL EXAM: BP 130/81   Pulse (!) 118   Resp 16   Wt 243 lb 6.4 oz (110.4 kg)   SpO2 100%   BMI 35.94 kg/m   Physical Exam  General appearance - alert, well appearing, obese young to middle-aged Caucasian female and in no distress Mental status - normal mood, behavior, speech, dress, motor activity, and thought processes Skin -about a 3 to 4 cm bald patch on the top of the head.  See picture attached. Right leg: Right upper posterior thigh, she has a 2  cm circular area with slight erythema border and central clearing.  There are several other smaller circular lesions.     CMP Latest Ref Rng & Units 11/14/2019 01/10/2017  Glucose 70 - 99 mg/dL 123(H) 112(H)  BUN 6 - 20 mg/dL 6 9  Creatinine 0.44 - 1.00 mg/dL 0.84 0.74  Sodium 135 - 145 mmol/L 140 139  Potassium 3.5 - 5.1 mmol/L 4.1 4.3  Chloride 98 - 111 mmol/L 105 106  CO2 22 - 32 mmol/L 23 26  Calcium 8.9 - 10.3 mg/dL 9.7 9.0  Total Protein 6.5 - 8.1 g/dL - 6.7  Total Bilirubin 0.3 - 1.2 mg/dL - 0.5  Alkaline Phos 38 - 126 U/L - 50  AST 15 - 41 U/L - 42(H)  ALT 14 - 54 U/L - 49   Lipid Panel  No results found for: CHOL, TRIG, HDL, CHOLHDL, VLDL, LDLCALC, LDLDIRECT  CBC    Component Value Date/Time   WBC 7.9 09/15/2019 1537   WBC 7.6 01/10/2017 0929   RBC 4.02 09/15/2019 1537   RBC 4.32 01/10/2017 0929   HGB 11.4 09/15/2019 1537   HCT 34.8 09/15/2019 1537   PLT 185 09/15/2019 1537   MCV 87 09/15/2019 1537   MCH 28.4 09/15/2019 1537   MCH 29.9 01/10/2017 0929   MCHC 32.8 09/15/2019 1537   MCHC 32.7 01/10/2017 0929   RDW 14.0 09/15/2019 1537   LYMPHSABS 2.4 01/10/2017 0929   MONOABS 0.5 01/10/2017 0929   EOSABS 0.3 01/10/2017 0929   BASOSABS 0.1 01/10/2017 0929   Lab Results  Component Value Date   HGBA1C 5.9 (H) 05/03/2019    ASSESSMENT AND PLAN: 1. Dermatitis Recommend using the clotrimazole cream twice a  day for the next 7 days.  Lesions look like ringworm.  2. Alopecia - Ambulatory referral to Dermatology  3. Tobacco dependence Advised to quit.  She is aware of health risks associated with smoking.  She is not ready to give a trial of quitting.  Less than 5 minutes spent on counseling.  4. Obesity (BMI 30-39.9) 5. Prediabetes Commended her on changes that she has made so far in her diet.  Additional counseling given.  Printed information given.  Encouraged her to try to get in some moderate intensity exercise at least 3 to 4 days a week for 45 minutes.   Patient was given the opportunity to ask questions.  Patient verbalized understanding of the plan and was able to repeat key elements of the plan.   No orders of the defined types were placed in this encounter.    Requested Prescriptions    No prescriptions requested or ordered in this encounter    No follow-ups on file.  Karle Plumber, MD, FACP

## 2020-01-09 ENCOUNTER — Encounter: Payer: Self-pay | Admitting: Obstetrics & Gynecology

## 2020-01-09 ENCOUNTER — Ambulatory Visit (INDEPENDENT_AMBULATORY_CARE_PROVIDER_SITE_OTHER): Payer: 59 | Admitting: Obstetrics & Gynecology

## 2020-01-09 ENCOUNTER — Other Ambulatory Visit: Payer: Self-pay

## 2020-01-09 VITALS — BP 120/80 | HR 99 | Wt 246.0 lb

## 2020-01-09 DIAGNOSIS — E6609 Other obesity due to excess calories: Secondary | ICD-10-CM

## 2020-01-09 DIAGNOSIS — L659 Nonscarring hair loss, unspecified: Secondary | ICD-10-CM | POA: Diagnosis not present

## 2020-01-09 DIAGNOSIS — N938 Other specified abnormal uterine and vaginal bleeding: Secondary | ICD-10-CM | POA: Diagnosis not present

## 2020-01-09 DIAGNOSIS — Z6839 Body mass index (BMI) 39.0-39.9, adult: Secondary | ICD-10-CM

## 2020-01-09 DIAGNOSIS — F17200 Nicotine dependence, unspecified, uncomplicated: Secondary | ICD-10-CM

## 2020-01-09 MED ORDER — MEDROXYPROGESTERONE ACETATE 10 MG PO TABS: ORAL_TABLET | ORAL | 2 refills | Status: DC

## 2020-01-09 MED ORDER — SPIRONOLACTONE 100 MG PO TABS: 50.0000 mg | ORAL_TABLET | Freq: Every day | ORAL | 4 refills | Status: DC

## 2020-01-09 MED ORDER — METFORMIN HCL 500 MG PO TABS: 500.0000 mg | ORAL_TABLET | Freq: Every day | ORAL | 5 refills | Status: DC

## 2020-01-09 NOTE — Progress Notes (Signed)
Patient ID: Heidi Bartlett, female   DOB: 1980-12-29, 39 y.o.   MRN: [id number]  Chief Complaint  Patient presents with  . Follow-up    HPI Heidi Bartlett is a 39 y.o. female.  [id number] Patient's last menstrual period was 01/02/2020 (approximate). Patient had heavy bleeding for 5 days after the first pack of OCP. She started the next pack 2 days early and has no bleeding now. She doesn't plan to stop smoking and wants cycle control without contraception.  HPI  Past Medical History:  Diagnosis Date  . Abnormal uterine bleeding (AUB)   . Anxiety   . Pre-diabetes     Past Surgical History:  Procedure Laterality Date  . CESAREAN SECTION  2004  . CHOLECYSTECTOMY  2004  . LAPAROSCOPIC UNILATERAL SALPINGO OOPHERECTOMY Right 11/15/2019   Procedure: LAPAROSCOPIC RIGHT SALPINGO OOPHORECTOMY AND RIGHT OVARIAN CYSTECTOMY;  Surgeon: Woodroe Mode, MD;  Location: [place];  Service: Gynecology;  Laterality: Right;    Family History  Problem Relation Age of Onset  . Breast cancer Mother     Social History Social History   Tobacco Use  . Smoking status: Current Every Day Smoker    Packs/day: 0.50    Types: Cigarettes  . Smokeless tobacco: Never Used  Vaping Use  . Vaping Use: Never used  Substance Use Topics  . Alcohol use: Not Currently  . Drug use: Not Currently    Allergies  Allergen Reactions  . Dilaudid [Hydromorphone] Itching  . Codeine Hives  . Tape Rash    Current Outpatient Medications  Medication Sig Dispense Refill  . cyclobenzaprine (FLEXERIL) 10 MG tablet TAKE 1 TABLET (10 MG TOTAL) BY MOUTH AT BEDTIME. 30 tablet 2  . folic acid (FOLVITE) 798 MCG tablet Take 400 mcg by mouth daily.    . metFORMIN (GLUCOPHAGE) 500 MG tablet Take 1 tablet (500 mg total) by mouth daily with breakfast. 30 tablet 5  . spironolactone (ALDACTONE) 100 MG tablet Take 0.5 tablets (50 mg total) by mouth daily. Will start with 50 mg twice daily, and increase to  100 mg twice daily as needed 30 tablet 4  . medroxyPROGESTERone (PROVERA) 10 MG tablet Take one by mouth daily until 01/31/20, then take one a day on day 1-14 each month starting 02/18/2020 60 tablet 2   No current facility-administered medications for this visit.    Review of Systems Review of Systems  Constitutional: Negative.   HENT: Negative.   Gastrointestinal: Negative.   Genitourinary: Positive for menstrual problem. Negative for vaginal bleeding and vaginal discharge.    Blood pressure 120/80, pulse 99, weight 246 lb (111.6 kg), last menstrual period 01/02/2020.  Physical Exam Physical Exam Vitals and nursing note reviewed.  Constitutional:      Appearance: She is obese. She is not ill-appearing.  Pulmonary:     Effort: Pulmonary effort is normal.  Abdominal:     General: Abdomen is flat.  Neurological:     Mental Status: She is alert.  Psychiatric:        Mood and Affect: Mood normal.        Behavior: Behavior normal.    Data Reviewed  Pathology report and [place] results   Assessment Class 2 obesity due to excess calories without serious comorbidity with body mass index (BMI) of 39.0 to 39.9 in adult - Plan: spironolactone (ALDACTONE) 100 MG tablet, metFORMIN (GLUCOPHAGE) 500 MG tablet  Tobacco dependence  Dysfunctional uterine bleeding - Plan: medroxyPROGESTERone (PROVERA) 10 MG tablet, spironolactone (  ALDACTONE) 100 MG tablet, metFORMIN (GLUCOPHAGE) 500 MG tablet  Alopecia  Does not plan to quit smoking. Wants to be able to conceive. Will d/c OCP  Plan Cyclic MPA 10 mg 14 days a month. Start now and continue to 12/14, then day 1-14 every month and f/u in 4 months    [name] 01/09/2020, 12:12 PM

## 2020-01-17 MED FILL — CYCLOBENZAPRINE 10 MG TAB: 10 | 30 days supply | Qty: 30 | Fill #1

## 2020-01-27 MED FILL — SPIRONOLACTONE 100 MG TAB: 100 | 15 days supply | Qty: 30 | Fill #2

## 2020-02-14 MED FILL — CYCLOBENZAPRINE 10 MG TAB: 10 | 30 days supply | Qty: 30 | Fill #2

## 2020-03-01 ENCOUNTER — Ambulatory Visit: Payer: 59 | Admitting: Internal Medicine

## 2020-03-10 ENCOUNTER — Encounter: Payer: Self-pay | Admitting: Internal Medicine

## 2020-03-10 DIAGNOSIS — M5442 Lumbago with sciatica, left side: Secondary | ICD-10-CM

## 2020-03-10 DIAGNOSIS — G8929 Other chronic pain: Secondary | ICD-10-CM

## 2020-03-10 MED ORDER — CYCLOBENZAPRINE HCL 10 MG PO TABS: 10.0000 mg | ORAL_TABLET | Freq: Every day | ORAL | 2 refills | Status: DC

## 2020-03-12 ENCOUNTER — Other Ambulatory Visit: Payer: Self-pay

## 2020-03-12 ENCOUNTER — Ambulatory Visit (INDEPENDENT_AMBULATORY_CARE_PROVIDER_SITE_OTHER): Payer: 59 | Admitting: Dermatology

## 2020-03-12 ENCOUNTER — Encounter: Payer: Self-pay | Admitting: Dermatology

## 2020-03-12 ENCOUNTER — Encounter: Payer: Self-pay | Admitting: Family

## 2020-03-12 DIAGNOSIS — L918 Other hypertrophic disorders of the skin: Secondary | ICD-10-CM

## 2020-03-12 DIAGNOSIS — L659 Nonscarring hair loss, unspecified: Secondary | ICD-10-CM

## 2020-03-12 DIAGNOSIS — L639 Alopecia areata, unspecified: Secondary | ICD-10-CM | POA: Diagnosis not present

## 2020-03-12 DIAGNOSIS — L821 Other seborrheic keratosis: Secondary | ICD-10-CM

## 2020-03-12 MED ORDER — CLOBETASOL PROPIONATE 0.05 % EX SOLN: 1.0000 "application " | CUTANEOUS | 0 refills | Status: DC

## 2020-03-12 NOTE — Patient Instructions (Addendum)
Alopecia Areata  Alopecia areata is a condition in which a person loses hair. It can occur in patches, may affect the entire scalp, or affect the scalp and body. 1 to 2 % of the [place] population may be affected during their lifetime. It is an autoimmune disease in which the person's body attacks its own hair follicles. It occurs most commonly in children and young adults but can affect all ages. It is not usually associated with any underlying medical condition. There is no cure for alopecia areata. With time, most people regrow hair while some may continue to have hair loss.    Treatment is targeted at re-growing hairs that have been lost. It does not prevent new hair from falling out. The most common treatments include topical corticosteroids, injection of steroids into the skin, and topical minoxidil (Rogaine) solution. Some times short contact therapy with an irritating substance is used as well.  The National [place] can provide support to patients and families affected by alopecia areata. Their website is https://www.berry.org/.    Seborrheic Keratosis  What causes seborrheic keratoses? Seborrheic keratoses are harmless, common skin growths that first appear during adult life.  As time goes by, more growths appear.  Some people may develop a large number of them.  Seborrheic keratoses appear on both covered and uncovered body parts.  They are not caused by sunlight.  The tendency to develop seborrheic keratoses can be inherited.  They vary in color from skin-colored to gray, brown, or even black.  They can be either smooth or have a rough, warty surface.   Seborrheic keratoses are superficial and look as if they were stuck on the skin.  Under the microscope this type of keratosis looks like layers upon layers of skin.  That is why at times the top layer may seem to fall off, but the rest of the growth remains and re-grows.    Treatment Seborrheic keratoses do not need to be treated, but  can easily be removed in the office.  Seborrheic keratoses often cause symptoms when they rub on clothing or jewelry.  Lesions can be in the way of shaving.  If they become inflamed, they can cause itching, soreness, or burning.  Removal of a seborrheic keratosis can be accomplished by freezing, burning, or surgery. If any spot bleeds, scabs, or grows rapidly, please return to have it checked, as these can be an indication of a skin cancer.

## 2020-03-12 NOTE — Progress Notes (Signed)
   New Patient Visit  Subjective  Heidi Bartlett is a 40 y.o. female who presents for the following: hairloss (Scalp, itchy prn, painful prn, noticed around 09/26/19, pt taking Biotin 10,000mg , ).  Hasn't had this happen before.  No other spots like it on scalp that she knows of.  New patient referral from Dr. Scherrie Bateman  The following portions of the chart were reviewed this encounter and updated as appropriate:       Review of Systems:  No other skin or systemic complaints except as noted in HPI or Assessment and Plan.  Objective  Well appearing patient in no apparent distress; mood and affect are within normal limits.  A focused examination was performed including scalp, neck, arms. Relevant physical exam findings are noted in the Assessment and Plan.  Objective  L crown scalp: 5.0 x 3.0cm patch of alopecia L crown scalp  Images    Objective  R arm: Stuck-on, waxy, tan-brown papules and macules-- Discussed benign etiology and prognosis.    Assessment & Plan    Acrochordons (Skin Tags) - Fleshy, skin-colored pedunculated papules - Benign appearing.  - Observe. - If desired, they can be removed with an in office procedure that is not covered by insurance. - Please call the clinic if you notice any new or changing lesions.  Alopecia L crown scalp  Alopecia Areata  Alopecia areata is a chronic autoimmune condition localized to the skin which affects hair follicles and causes hair loss, most commonly in the scalp.  Cause is unknown.  Can be unpredictable, difficult to treat, and may recur.  Treatment methods include use of topical and intralesional steroids to decrease inflammation to allow for hair regrowth.   TSH normal from 05/03/19 labs  Discussed txt options, topical steroids vs IL injections  IL injections today 3mg /ml TV=3.0cc Lot # [id number] exp 04/2021  Start Clobetasol sol qd/bid aa scalp, avoid f/g/a  Intralesional steroid injection side  effects were reviewed including thinning of the skin and discoloration, such as redness, lightening or darkening.   Topical steroids (such as triamcinolone, fluocinolone, fluocinonide, mometasone, clobetasol, halobetasol, betamethasone, hydrocortisone) can cause thinning and lightening of the skin if they are used for too long in the same area. Your physician has selected the right strength medicine for your problem and area affected on the body. Please use your medication only as directed by your physician to prevent side effects.     Intralesional injection - L crown scalp Location: L crown scalp  Informed Consent: Discussed risks (infection, pain, bleeding, bruising, thinning of the skin, loss of skin pigment, lack of resolution, and recurrence of lesion) and benefits of the procedure, as well as the alternatives. Informed consent was obtained. Preparation: The area was prepared a standard fashion.  Anesthesia:None  Procedure Details: An intralesional injection was performed with 3.0mg /ml. 3.0 cc in total were injected.  Total number of injections: >7  Plan: The patient was instructed on post-op care. Recommend OTC analgesia as needed for pain.   clobetasol (TEMOVATE) 0.05 % external solution - L crown scalp  Seborrheic keratosis R arm  Benign, observe  Return in about 2 months (around 05/10/2020) for Alopecia f/u.  I, Othelia Pulling, RMA, am acting as scribe for Brendolyn Patty, MD . Documentation: I have reviewed the above documentation for accuracy and completeness, and I agree with the above.  Brendolyn Patty MD

## 2020-03-13 MED ORDER — FLUOCINONIDE 0.05 % EX SOLN: CUTANEOUS | 1 refills | Status: DC

## 2020-04-27 ENCOUNTER — Other Ambulatory Visit: Payer: Self-pay

## 2020-04-27 ENCOUNTER — Encounter: Payer: Self-pay | Admitting: Nurse Practitioner

## 2020-04-27 ENCOUNTER — Ambulatory Visit: Payer: 59 | Attending: Nurse Practitioner | Admitting: Nurse Practitioner

## 2020-04-27 ENCOUNTER — Other Ambulatory Visit (HOSPITAL_COMMUNITY)
Admission: RE | Admit: 2020-04-27 | Discharge: 2020-04-27 | Disposition: A | Discharge status: Home / Self Care | Payer: 59 | Source: Ambulatory Visit | Attending: Nurse Practitioner | Admitting: Nurse Practitioner

## 2020-04-27 DIAGNOSIS — B373 Candidiasis of vulva and vagina: Secondary | ICD-10-CM

## 2020-04-27 DIAGNOSIS — N898 Other specified noninflammatory disorders of vagina: Secondary | ICD-10-CM | POA: Insufficient documentation

## 2020-04-27 DIAGNOSIS — B3731 Acute candidiasis of vulva and vagina: Secondary | ICD-10-CM

## 2020-04-27 MED ORDER — FLUCONAZOLE 150 MG PO TABS: 150.0000 mg | ORAL_TABLET | Freq: Once | ORAL | 1 refills | Status: AC

## 2020-04-27 NOTE — Progress Notes (Signed)
Pt states the otc medication doesn't help

## 2020-04-27 NOTE — Progress Notes (Signed)
Virtual Visit via Telephone Note Due to national recommendations of social distancing due to [place] 19, telehealth visit is felt to be most appropriate for this patient at this time.  I discussed the limitations, risks, security and privacy concerns of performing an evaluation and management service by telephone and the availability of in person appointments. I also discussed with the patient that there may be a patient responsible charge related to this service. The patient expressed understanding and agreed to proceed.    I connected with Heidi Bartlett on 04/27/20  at   2:50 PM EST  EDT by telephone and verified that I am speaking with the correct person using two identifiers.   Consent I discussed the limitations, risks, security and privacy concerns of performing an evaluation and management service by telephone and the availability of in person appointments. I also discussed with the patient that there may be a patient responsible charge related to this service. The patient expressed understanding and agreed to proceed.   Location of Patient: Private  Residence   Location of Provider: [place] [facility] [place] participating in Telemedicine visit: Geryl Rankins FNP-BC [place]    History of Present Illness: Telemedicine visit for: Vaginitis symptoms  Symptom onset 3-4 days ago and include vaginal itching which is worse at night. She has been using summer's eve vaginal wash as well. She has tried monistat in the past for symptoms which she states was ineffective and then was prescribed diflucan which relieved her symptoms.  She denies dysuria, hematuria or bilateral flank pain.  Lab Results  Component Value Date   HGBA1C 5.9 (H) 05/03/2019    Past Medical History:  Diagnosis Date  . Abnormal uterine bleeding (AUB)   . Anxiety   . Pre-diabetes     Past Surgical History:  Procedure Laterality Date  . CESAREAN  SECTION  2004  . CHOLECYSTECTOMY  2004  . LAPAROSCOPIC UNILATERAL SALPINGO OOPHERECTOMY Right 11/15/2019   Procedure: LAPAROSCOPIC RIGHT SALPINGO OOPHORECTOMY AND RIGHT OVARIAN CYSTECTOMY;  Surgeon: Woodroe Mode, MD;  Location: [facility];  Service: Gynecology;  Laterality: Right;    Family History  Problem Relation Age of Onset  . Breast cancer Mother     Social History   Socioeconomic History  . Marital status: Married    Spouse name: Not on file  . Number of children: 0  . Years of education: 9th grade  . Highest education level: Not on file  Occupational History  . Occupation: unemployed  Tobacco Use  . Smoking status: Current Every Day Smoker    Packs/day: 0.50    Types: Cigarettes  . Smokeless tobacco: Never Used  Vaping Use  . Vaping Use: Never used  Substance and Sexual Activity  . Alcohol use: Not Currently  . Drug use: Not Currently  . Sexual activity: Yes    Birth control/protection: None  Other Topics Concern  . Not on file  Social History Narrative  . Not on file   Social Determinants of Health   Financial Resource Strain: Not on file  Food Insecurity: No Food Insecurity  . Worried About Charity fundraiser in the Last Year: Never true  . Ran Out of Food in the Last Year: Never true  Transportation Needs: No Transportation Needs  . Lack of Transportation (Medical): No  . Lack of Transportation (Non-Medical): No  Physical Activity: Not on file  Stress: Not on file  Social Connections:  Not on file     Observations/Objective: Awake, alert and oriented x 3   Review of Systems  Constitutional: Negative for fever, malaise/fatigue and weight loss.  HENT: Negative.  Negative for nosebleeds.   Eyes: Negative.  Negative for blurred vision, double vision and photophobia.  Respiratory: Negative.  Negative for cough and shortness of breath.   Cardiovascular: Negative.  Negative for chest pain, palpitations and leg swelling.   Gastrointestinal: Negative.  Negative for heartburn, nausea and vomiting.  Genitourinary:       Vaginal itching  Musculoskeletal: Negative.  Negative for myalgias.  Neurological: Negative.  Negative for dizziness, focal weakness, seizures and headaches.  Psychiatric/Behavioral: Negative.  Negative for suicidal ideas.    Assessment and Plan: Heidi Bartlett was seen today for vaginal itching.  Diagnoses and all orders for this visit:  Vaginal candidiasis -     Cervicovaginal ancillary only -     fluconazole (DIFLUCAN) 150 MG tablet; Take 1 tablet (150 mg total) by mouth once for 1 dose.     Follow Up Instructions Return if symptoms worsen or fail to improve.     I discussed the assessment and treatment plan with the patient. The patient was provided an opportunity to ask questions and all were answered. The patient agreed with the plan and demonstrated an understanding of the instructions.   The patient was advised to call back or seek an in-person evaluation if the symptoms worsen or if the condition fails to improve as anticipated.  I provided 12 minutes of non-face-to-face time during this encounter including median intraservice time, reviewing previous notes, labs, imaging, medications and explaining diagnosis and management.  Gildardo Pounds, FNP-BC

## 2020-04-29 LAB — CERVICOVAGINAL ANCILLARY ONLY
Bacterial Vaginitis (gardnerella): NEGATIVE
Candida Glabrata: NEGATIVE
Candida Vaginitis: POSITIVE — AB
Chlamydia: NEGATIVE
Comment: NEGATIVE
Comment: NEGATIVE
Comment: NEGATIVE
Comment: NEGATIVE
Comment: NEGATIVE
Comment: NORMAL
Neisseria Gonorrhea: NEGATIVE
Trichomonas: NEGATIVE

## 2020-05-14 ENCOUNTER — Other Ambulatory Visit: Payer: Self-pay

## 2020-05-14 ENCOUNTER — Ambulatory Visit (INDEPENDENT_AMBULATORY_CARE_PROVIDER_SITE_OTHER): Payer: 59 | Admitting: Dermatology

## 2020-05-14 DIAGNOSIS — L639 Alopecia areata, unspecified: Secondary | ICD-10-CM | POA: Diagnosis not present

## 2020-05-14 NOTE — Patient Instructions (Signed)

## 2020-05-14 NOTE — Progress Notes (Signed)
   Follow-Up Visit   Subjective  Heidi Bartlett is a 40 y.o. female who presents for the following: Follow-up (Patient here for 2 month follow-up alopecia areata. She is much improved with ILK injections. She used clobetasol solution a couple of times, but doesn't need it now.).   The following portions of the chart were reviewed this encounter and updated as appropriate:       Review of Systems:  No other skin or systemic complaints except as noted in HPI or Assessment and Plan.  Objective  Well appearing patient in no apparent distress; mood and affect are within normal limits.  A focused examination was performed including face, scalp. Relevant physical exam findings are noted in the Assessment and Plan.  Objective  Left crown: 4.0 x 2.0cm patch of alopecia with some hair regrowth.   Assessment & Plan  Alopecia areata Left crown  Improving  Alopecia areata is a chronic autoimmune condition localized to the skin which affects hair follicles and causes hair loss, most commonly in the scalp.  Cause is unknown.  Can be unpredictable, difficult to treat, and may recur.  Treatment methods include use of topical and intralesional steroids to decrease inflammation to allow for hair regrowth.   Intralesional steroid injection side effects were reviewed including thinning of the skin and discoloration, such as redness, lightening or darkening.   May continue clobetasol solution prn itch.  Topical steroids (such as triamcinolone, fluocinolone, fluocinonide, mometasone, clobetasol, halobetasol, betamethasone, hydrocortisone) can cause thinning and lightening of the skin if they are used for too long in the same area. Your physician has selected the right strength medicine for your problem and area affected on the body. Please use your medication only as directed by your physician to prevent side effects.    Intralesional injection - Left crown Location: left crown  Informed  Consent: Discussed risks (infection, pain, bleeding, bruising, thinning of the skin, loss of skin pigment, lack of resolution, and recurrence of lesion) and benefits of the procedure, as well as the alternatives. Informed consent was obtained. Preparation: The area was prepared a standard fashion.  Procedure Details: An intralesional injection was performed with Kenalog 3mg /mL. 2.0 cc in total were injected.  Total number of injections: >7  Plan: The patient was instructed on post-op care. Recommend OTC analgesia as needed for pain.  Lot No. [id number] Exp 04/2021  Return in about 3 months (around 08/14/2020) for alopecia areata.   IJamesetta Orleans, CMA, am acting as scribe for Brendolyn Patty, MD .  Documentation: I have reviewed the above documentation for accuracy and completeness, and I agree with the above.  Brendolyn Patty MD

## 2020-05-19 ENCOUNTER — Other Ambulatory Visit: Payer: Self-pay

## 2020-05-28 ENCOUNTER — Other Ambulatory Visit (HOSPITAL_BASED_OUTPATIENT_CLINIC_OR_DEPARTMENT_OTHER): Payer: Self-pay

## 2020-06-06 ENCOUNTER — Encounter: Payer: Self-pay | Admitting: Internal Medicine

## 2020-06-06 DIAGNOSIS — G8929 Other chronic pain: Secondary | ICD-10-CM

## 2020-06-06 MED ORDER — CYCLOBENZAPRINE HCL 10 MG PO TABS: 10.0000 mg | ORAL_TABLET | Freq: Every day | ORAL | 2 refills | Status: DC

## 2020-06-14 ENCOUNTER — Ambulatory Visit: Payer: 59 | Admitting: Obstetrics & Gynecology

## 2020-06-18 ENCOUNTER — Encounter: Payer: Self-pay | Admitting: Obstetrics & Gynecology

## 2020-06-18 ENCOUNTER — Ambulatory Visit (INDEPENDENT_AMBULATORY_CARE_PROVIDER_SITE_OTHER): Payer: 59 | Admitting: Obstetrics & Gynecology

## 2020-06-18 ENCOUNTER — Other Ambulatory Visit: Payer: Self-pay

## 2020-06-18 VITALS — BP 119/80 | HR 107 | Wt 237.0 lb

## 2020-06-18 DIAGNOSIS — E6609 Other obesity due to excess calories: Secondary | ICD-10-CM

## 2020-06-18 DIAGNOSIS — R7303 Prediabetes: Secondary | ICD-10-CM | POA: Diagnosis not present

## 2020-06-18 DIAGNOSIS — E669 Obesity, unspecified: Secondary | ICD-10-CM

## 2020-06-18 DIAGNOSIS — N938 Other specified abnormal uterine and vaginal bleeding: Secondary | ICD-10-CM

## 2020-06-18 DIAGNOSIS — Z6839 Body mass index (BMI) 39.0-39.9, adult: Secondary | ICD-10-CM

## 2020-06-18 MED ORDER — METFORMIN HCL 500 MG PO TABS: 500.0000 mg | ORAL_TABLET | Freq: Every day | ORAL | 5 refills | Status: DC

## 2020-06-18 MED ORDER — SPIRONOLACTONE 100 MG PO TABS: 50.0000 mg | ORAL_TABLET | Freq: Every day | ORAL | 4 refills | Status: DC

## 2020-06-18 MED ORDER — MEDROXYPROGESTERONE ACETATE 10 MG PO TABS: ORAL_TABLET | ORAL | 2 refills | Status: DC

## 2020-06-18 NOTE — Patient Instructions (Signed)
Abnormal Uterine Bleeding Abnormal uterine bleeding means bleeding more than usual from your womb (uterus). It can include:  Bleeding between menstrual periods.  Bleeding after sex.  Bleeding that is heavier than normal.  Menstrual periods that last longer than usual.  Bleeding after you have stopped having your menstrual period (menopause). There are many problems that may cause this. You should see a doctor for any kind of bleeding that is not normal. Treatment depends on the cause of the bleeding. Follow these instructions at home: Medicines  Take over-the-counter and prescription medicines only as told by your doctor.  Tell your doctor about other medicines that you take. ? If told by your doctor, stop taking aspirin or medicines that have aspirin in them. These medicines can make you bleed more.  You may be given iron pills to replace iron that your body loses because of this condition. Take them as told by your doctor. Managing constipation If you are taking iron pills, you may have trouble pooping (constipation). To prevent or treat trouble pooping, you may need to:  Drink enough fluid to keep your pee (urine) pale yellow.  Take over-the-counter or prescription medicines.  Eat foods that are high in fiber. These include beans, whole grains, and fresh fruits and vegetables.  Limit foods that are high in fat and sugar. These include fried or sweet foods. General instructions  Watch your condition for any changes.  Do not use tampons, douche, or have sex, if your doctor tells you not to.  Change your pads often.  Get regular exams. This includes pelvic exams and cervical cancer screenings. ? It is up to you to get the results of any tests that are done. Ask your doctor, or the department that is doing the tests, when your results will be ready.  Keep all follow-up visits as told by your doctor. This is important. Contact a doctor if:  The bleeding lasts more than 1  week.  You feel dizzy at times.  You feel like you may vomit (nausea).  You vomit.  You feel light-headed or weak.  Your symptoms get worse. Get help right away if:  You pass out.  You have to change pads every hour.  You have pain in your belly.  You have a fever or chills.  You get sweaty.  You get weak.  You pass large blood clots from your vagina. Summary  Abnormal uterine bleeding means bleeding more than usual from your womb (uterus).  Any kind of bleeding that is not normal should be checked by a doctor.  Treatment depends on the cause of the bleeding.  Get help right away if you pass out, you have to change pads every hour, or you pass large blood clots from your vagina. This information is not intended to replace advice given to you by your health care provider. Make sure you discuss any questions you have with your health care provider. Document Revised: 12/07/2018 Document Reviewed: 12/07/2018 Elsevier Patient Education  2021 Elsevier Inc.  

## 2020-06-18 NOTE — Progress Notes (Signed)
Patient ID: Heidi Bartlett, female   DOB: 1980-09-06, 40 y.o.   MRN: [id number]  Chief Complaint  Patient presents with  . Follow-up    HPI Heidi Bartlett is a 40 y.o. female.  No LMP recorded. (Menstrual status: Irregular Periods). [id number] Her vaginal bleeding has become irregular even taking the cyclic Provera as prescribed, sometimes heavy. She states she is decreasing smoking and wants to quit.  HPI  Past Medical History:  Diagnosis Date  . Abnormal uterine bleeding (AUB)   . Anxiety   . Pre-diabetes     Past Surgical History:  Procedure Laterality Date  . CESAREAN SECTION  2004  . CHOLECYSTECTOMY  2004  . LAPAROSCOPIC UNILATERAL SALPINGO OOPHERECTOMY Right 11/15/2019   Procedure: LAPAROSCOPIC RIGHT SALPINGO OOPHORECTOMY AND RIGHT OVARIAN CYSTECTOMY;  Surgeon: Woodroe Mode, MD;  Location: [facility];  Service: Gynecology;  Laterality: Right;    Family History  Problem Relation Age of Onset  . Breast cancer Mother     Social History Social History   Tobacco Use  . Smoking status: Current Every Day Smoker    Packs/day: 0.50    Types: Cigarettes  . Smokeless tobacco: Never Used  Vaping Use  . Vaping Use: Never used  Substance Use Topics  . Alcohol use: Not Currently  . Drug use: Not Currently    Allergies  Allergen Reactions  . Dilaudid [Hydromorphone] Itching  . Codeine Hives  . Tape Rash    Current Outpatient Medications  Medication Sig Dispense Refill  . clobetasol (TEMOVATE) 0.05 % external solution Apply 1 application topically as directed. Qd to bid aa scalp, avoid face, groin, axilla 50 mL 0  . cyclobenzaprine (FLEXERIL) 10 MG tablet Take 1 tablet (10 mg total) by mouth at bedtime. 30 tablet 2  . fluocinonide (LIDEX) 0.05 % external solution Apply to aa's scalp QD-BID PRN. 60 mL 1  . medroxyPROGESTERone (PROVERA) 10 MG tablet Take one by mouth daily until 01/31/20, then take one a day on day 1-14 each month starting  02/18/2020 60 tablet 2  . metFORMIN (GLUCOPHAGE) 500 MG tablet Take 1 tablet (500 mg total) by mouth daily with breakfast. 30 tablet 5  . spironolactone (ALDACTONE) 100 MG tablet Take 0.5 tablets (50 mg total) by mouth daily. Will start with 50 mg twice daily, and increase to 100 mg twice daily as needed 30 tablet 4   No current facility-administered medications for this visit.    Review of Systems Review of Systems  Constitutional: Negative for unexpected weight change.  Respiratory: Negative.   Gastrointestinal: Negative.   Genitourinary: Positive for menstrual problem and vaginal bleeding.    Blood pressure 119/80, pulse (!) 107, weight 237 lb (107.5 kg).  Physical Exam Physical Exam Constitutional:      Appearance: She is obese. She is not ill-appearing.  Pulmonary:     Effort: Pulmonary effort is normal.  Neurological:     Mental Status: She is alert.  Psychiatric:        Mood and Affect: Mood normal.        Behavior: Behavior normal.     Data Reviewed CBC    Component Value Date/Time   WBC 7.9 09/15/2019 1537   WBC 7.6 01/10/2017 0929   RBC 4.02 09/15/2019 1537   RBC 4.32 01/10/2017 0929   HGB 11.4 09/15/2019 1537   HCT 34.8 09/15/2019 1537   PLT 185 09/15/2019 1537   MCV 87 09/15/2019 1537   MCH 28.4 09/15/2019 1537  MCH 29.9 01/10/2017 0929   MCHC 32.8 09/15/2019 1537   MCHC 32.7 01/10/2017 0929   RDW 14.0 09/15/2019 1537   LYMPHSABS 2.4 01/10/2017 0929   MONOABS 0.5 01/10/2017 0929   EOSABS 0.3 01/10/2017 0929   BASOSABS 0.1 01/10/2017 0929     Assessment DUB anovulatory most likely Wants cycle control that is not contraception  Plan Orders Placed This Encounter  Procedures  . CBC  . Hemoglobin A1c   Meds ordered this encounter  Medications  . medroxyPROGESTERone (PROVERA) 10 MG tablet    Sig: Take one by mouth daily until 01/31/20, then take one a day on day 1-14 each month starting 02/18/2020    Dispense:  60 tablet    Refill:  2  .  spironolactone (ALDACTONE) 100 MG tablet    Sig: Take 0.5 tablets (50 mg total) by mouth daily. Will start with 50 mg twice daily, and increase to 100 mg twice daily as needed    Dispense:  30 tablet    Refill:  4  . metFORMIN (GLUCOPHAGE) 500 MG tablet    Sig: Take 1 tablet (500 mg total) by mouth daily with breakfast.    Dispense:  30 tablet    Refill:  5   RTC to review her cycles in 6 months Discussed smoking cessation and I recommend nicotine patch and program to quit      [name] 06/18/2020, 4:52 PM

## 2020-06-19 LAB — CBC
Hematocrit: 42 % (ref 34.0–46.6)
Hemoglobin: 13.3 g/dL (ref 11.1–15.9)
MCH: 26.4 pg — ABNORMAL LOW (ref 26.6–33.0)
MCHC: 31.7 g/dL (ref 31.5–35.7)
MCV: 83 fL (ref 79–97)
Platelets: 199 10*3/uL (ref 150–450)
RBC: 5.04 x10E6/uL (ref 3.77–5.28)
RDW: 14 % (ref 11.7–15.4)
WBC: 10.2 10*3/uL (ref 3.4–10.8)

## 2020-06-19 LAB — HEMOGLOBIN A1C
Est. average glucose Bld gHb Est-mCnc: 131 mg/dL
Hgb A1c MFr Bld: 6.2 % — ABNORMAL HIGH (ref 4.8–5.6)

## 2020-06-21 ENCOUNTER — Other Ambulatory Visit: Payer: Self-pay

## 2020-06-21 MED ORDER — CEPHALEXIN 500 MG PO CAPS: 500.0000 mg | ORAL_CAPSULE | Freq: Two times a day (BID) | ORAL | 0 refills | Status: AC

## 2020-06-21 NOTE — Telephone Encounter (Signed)
Per Dr. Rip Harbour pt can have Keflex 500 mg bid for boil.    Mel Almond, RN  06/21/20

## 2020-06-25 ENCOUNTER — Ambulatory Visit: Payer: Self-pay | Admitting: *Deleted

## 2020-06-25 NOTE — Telephone Encounter (Signed)
C/o increasing burning and tingling in bilateral bottom of feet, especially heels. Burning x couple of months but now more intense through out the day and night. Unable to sleep. Burning feels like "fire". Per patient , Dr. Roselie Awkward recommend she contact PCP. Denies swelling or difficulty walking on feet. Denies fever. Patient does not check her blood glucose. C/o frequency urinating and increased thirst. Encouraged patient to monitor diet and exercise more if she can. appt scheduled for 08/24/20. Care advise given. If symptoms worsen can go to mobile  Clinic. Patient reports she prefers to see PCP. care advise given and patient verbalized understanding of care advise and to call back or go to [facility] or ED if symptoms worsen.   Reason for Disposition . [1] Tingling in feet AND [2] new or increased  Answer Assessment - Initial Assessment Questions 1. ONSET: "When did the pain start?"      Couple of months ago and now more intense 2. LOCATION: "Where is the pain located?"      Bilateral bottom of feet  3. PAIN: "How bad is the pain?"    (Scale 1-10; or mild, moderate, severe)  - MILD (1-3): doesn't interfere with normal activities.   - MODERATE (4-7): interferes with normal activities (e.g., work or school) or awakens from sleep, limping.   - SEVERE (8-10): excruciating pain, unable to do any normal activities, unable to walk.      Moderate  4. WORK OR EXERCISE: "Has there been any recent work or exercise that involved this part of the body?"      No  5. CAUSE: "What do you think is causing the foot pain?"     Elevated A1C 6. OTHER SYMPTOMS: "Do you have any other symptoms?" (e.g., leg pain, rash, fever, numbness)     Tingling and burning in feet "like fire" 7. PREGNANCY: "Is there any chance you are pregnant?" "When was your last menstrual period?"     Vaginal bleeding. Treatment with provera for the next 3 weeks  Protocols used: FOOT PAIN-A-AH

## 2020-06-26 NOTE — Telephone Encounter (Signed)
Will forward to provider  

## 2020-06-28 NOTE — Telephone Encounter (Signed)
Pt can be schedule with any available provider or referred to the mobile bus

## 2020-06-28 NOTE — Telephone Encounter (Signed)
I checked [facility] schedule and she does not have any opening this month. She is already double booked for mornings and afternoons. Please advise if Dr. Wynetta Emery would like the patient to be scheduled at any other time on her schedule.

## 2020-06-29 NOTE — Telephone Encounter (Signed)
Spoke with Patient and she did not want to see any other provider or go to mobile. Patient stated she would wait to see Dr. Wynetta Emery. Patient was given an appointment to come June.

## 2020-06-30 ENCOUNTER — Other Ambulatory Visit: Payer: Self-pay | Admitting: Obstetrics & Gynecology

## 2020-06-30 DIAGNOSIS — Z6839 Body mass index (BMI) 39.0-39.9, adult: Secondary | ICD-10-CM

## 2020-06-30 DIAGNOSIS — N938 Other specified abnormal uterine and vaginal bleeding: Secondary | ICD-10-CM

## 2020-06-30 DIAGNOSIS — E6609 Other obesity due to excess calories: Secondary | ICD-10-CM

## 2020-07-09 ENCOUNTER — Ambulatory Visit (INDEPENDENT_AMBULATORY_CARE_PROVIDER_SITE_OTHER): Payer: 59

## 2020-07-09 ENCOUNTER — Other Ambulatory Visit: Payer: Self-pay

## 2020-07-09 ENCOUNTER — Other Ambulatory Visit (HOSPITAL_COMMUNITY)
Admission: RE | Admit: 2020-07-09 | Discharge: 2020-07-09 | Disposition: A | Discharge status: Home / Self Care | Payer: 59 | Source: Ambulatory Visit | Attending: Family Medicine | Admitting: Family Medicine

## 2020-07-09 VITALS — BP 123/74 | HR 91 | Wt 238.1 lb

## 2020-07-09 DIAGNOSIS — B379 Candidiasis, unspecified: Secondary | ICD-10-CM | POA: Diagnosis not present

## 2020-07-09 DIAGNOSIS — R238 Other skin changes: Secondary | ICD-10-CM | POA: Insufficient documentation

## 2020-07-09 MED ORDER — FLUCONAZOLE 150 MG PO TABS: 150.0000 mg | ORAL_TABLET | ORAL | 0 refills | Status: DC

## 2020-07-09 MED ORDER — NYSTATIN 100000 UNIT/GM EX POWD: 1.0000 "application " | Freq: Two times a day (BID) | CUTANEOUS | 0 refills | Status: DC

## 2020-07-09 NOTE — Progress Notes (Signed)
Here today with complaint of irritation to vagina and groin area. Pt brought photo to appt of inner groin skin; appears red and inflamed, no open lesion. Pt states she thought this was a boil and was prescribed Keflex antibiotic; reports completing course of antibiotic with minimal improvement. Pt states vaginal bleeding has improved since beginning Provera after visit with Roselie Awkward, MD on 06/18/20. Pt reports changing tampon every 3 hours, describes small spot of blood on tampon when changed. I encouraged pt to space out tampon changes as this may be irritating vaginal tissue. Pt states she has been using a pad or a tampon every day for the past year due to ongoing bleeding. I explained this may be causing irritation and it is important to follow up on vaginal bleeding so that she will not need to use menstrual products daily. Reports skin irritation and itching under both breasts. History significant for prediabetes, pt to establish care with PCP on 08/06/20; explained uncontrolled blood sugars can increase occurrence of yeast infections.  Reviewed with Rip Harbour, MD who recommends pt take Diflucan 150 mg weekly x 4 and use topical Nystatin powder to groin and breast area. Pt to follow up with Roselie Awkward, MD regarding vaginal bleeding and yeast infections.  Apolonio Schneiders RN 07/09/20

## 2020-07-10 LAB — CERVICOVAGINAL ANCILLARY ONLY
Bacterial Vaginitis (gardnerella): NEGATIVE
Candida Glabrata: NEGATIVE
Candida Vaginitis: POSITIVE — AB
Chlamydia: NEGATIVE
Comment: NEGATIVE
Comment: NEGATIVE
Comment: NEGATIVE
Comment: NEGATIVE
Comment: NEGATIVE
Comment: NORMAL
Neisseria Gonorrhea: NEGATIVE
Trichomonas: NEGATIVE

## 2020-07-10 NOTE — Progress Notes (Signed)
Agree with A & P. 

## 2020-08-06 ENCOUNTER — Encounter: Payer: Self-pay | Admitting: Nurse Practitioner

## 2020-08-06 ENCOUNTER — Ambulatory Visit: Payer: 59 | Attending: Internal Medicine | Admitting: Nurse Practitioner

## 2020-08-06 ENCOUNTER — Ambulatory Visit: Payer: 59 | Admitting: Internal Medicine

## 2020-08-06 ENCOUNTER — Other Ambulatory Visit: Payer: Self-pay

## 2020-08-06 VITALS — BP 112/77 | HR 99 | Ht 69.0 in | Wt 242.2 lb

## 2020-08-06 DIAGNOSIS — G8929 Other chronic pain: Secondary | ICD-10-CM

## 2020-08-06 DIAGNOSIS — F419 Anxiety disorder, unspecified: Secondary | ICD-10-CM | POA: Diagnosis not present

## 2020-08-06 DIAGNOSIS — M5441 Lumbago with sciatica, right side: Secondary | ICD-10-CM

## 2020-08-06 DIAGNOSIS — F32A Depression, unspecified: Secondary | ICD-10-CM

## 2020-08-06 DIAGNOSIS — M5442 Lumbago with sciatica, left side: Secondary | ICD-10-CM | POA: Diagnosis not present

## 2020-08-06 MED ORDER — HYDROXYZINE HCL 10 MG PO TABS: 10.0000 mg | ORAL_TABLET | Freq: Three times a day (TID) | ORAL | 1 refills | Status: AC | PRN

## 2020-08-06 MED ORDER — GABAPENTIN 600 MG PO TABS: 600.0000 mg | ORAL_TABLET | Freq: Every day | ORAL | 1 refills | Status: DC

## 2020-08-06 NOTE — Progress Notes (Signed)
Assessment & Plan:  Heidi Bartlett was seen today for foot pain.  Diagnoses and all orders for this visit:  Chronic bilateral low back pain with bilateral sciatica -     gabapentin (NEURONTIN) 600 MG tablet; Take 1 tablet (600 mg total) by mouth at bedtime. -     DG Lumb Spine Flex&Ext Only; Future  Anxiety and depression -     gabapentin (NEURONTIN) 600 MG tablet; Take 1 tablet (600 mg total) by mouth at bedtime. -     hydrOXYzine (ATARAX/VISTARIL) 10 MG tablet; Take 1-2 tablets (10-20 mg total) by mouth 3 (three) times daily as needed.   Patient has been counseled on age-appropriate routine health concerns for screening and prevention. These are reviewed and up-to-date. Referrals have been placed accordingly. Immunizations are up-to-date or declined.    Subjective:   Chief Complaint  Patient presents with   Foot Pain   HPI Heidi Bartlett 40 y.o. 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 She does have a history of lumbar pain with bilateral sciatica. She has seen Dr. Lorin Mercy with Ortho a few years ago for this.  PER ORTHO 01-12-2018 Plan: We discussed core strengthening working on weight loss.  She is gained more than 90 pounds since she moved to [place] and should get improvement in her back symptoms with weight loss and core strengthening.  If she is having persistent problems after this she can return.  We discussed that at some point she might require instrumented fusion for stabilization.  Thank you the opportunity to see her in consultation  She denies any GU symptoms or inability to control urine and stool.   Depression and Anxiety Endorses social anxiety which is worsening. She is not aware of how long ago this started but states this is the first time she has actually wanted to talk about it. Screenings for depression and anxiety are elevated. At this time she would like to try an anxiolytic. Will need follow up for depression  at next office visit with PCP. Report situational depression due to relationship issues.  Depression screen [facility] 2/9 08/06/2020 07/09/2020 06/18/2020 01/09/2020 01/03/2020  Decreased Interest 1 1 1  0 0  Down, Depressed, Hopeless 1 0 0 0 0  PHQ - 2 Score 2 1 1  0 0  Altered sleeping 2 1 1  0 0  Tired, decreased energy 2 1 1  0 0  Change in appetite 2 1 1  0 0  Feeling bad or failure about yourself  1 0 0 0 0  Trouble concentrating 1 0 0 0 0  Moving slowly or fidgety/restless 1 0 0 0 0  Suicidal thoughts 0 0 0 0 0  PHQ-9 Score 11 4 4  0 0  Some recent data might be hidden    GAD 7 : Generalized Anxiety Score 08/06/2020 07/09/2020 06/18/2020 01/09/2020  Nervous, Anxious, on Edge 2 1 1  0  Control/stop worrying 1 0 0 0  Worry too much - different things 2 0 1 0  Trouble relaxing 2 1 1  0  Restless 2 0 1 0  Easily annoyed or irritable 2 0 1 0  Afraid - awful might happen 1 0 0 0  Total GAD 7 Score 12 2 5  0  Anxiety Difficulty - - - -     Review of Systems  Constitutional:  Negative for fever, malaise/fatigue and weight loss.  HENT: Negative.  Negative for nosebleeds.   Eyes: Negative.  Negative for blurred vision,  double vision and photophobia.  Respiratory: Negative.  Negative for cough and shortness of breath.   Cardiovascular: Negative.  Negative for chest pain, palpitations and leg swelling.  Gastrointestinal: Negative.  Negative for heartburn, nausea and vomiting.  Musculoskeletal:  Positive for back pain. Negative for myalgias.  Neurological:  Positive for tingling and sensory change. Negative for dizziness, focal weakness, seizures and headaches.  Psychiatric/Behavioral:  Positive for depression. Negative for suicidal ideas. The patient is nervous/anxious.    Past Medical History:  Diagnosis Date   Abnormal uterine bleeding (AUB)    Anxiety    Pre-diabetes     Past Surgical History:  Procedure Laterality Date   CESAREAN SECTION  2004   CHOLECYSTECTOMY  2004   LAPAROSCOPIC  UNILATERAL SALPINGO OOPHERECTOMY Right 11/15/2019   Procedure: LAPAROSCOPIC RIGHT SALPINGO OOPHORECTOMY AND RIGHT OVARIAN CYSTECTOMY;  Surgeon: Woodroe Mode, MD;  Location: [facility];  Service: Gynecology;  Laterality: Right;    Family History  Problem Relation Age of Onset   Breast cancer Mother     Social History Reviewed with no changes to be made today.   Outpatient Medications Prior to Visit  Medication Sig Dispense Refill   clobetasol (TEMOVATE) 0.05 % external solution Apply 1 application topically as directed. Qd to bid aa scalp, avoid face, groin, axilla 50 mL 0   cyclobenzaprine (FLEXERIL) 10 MG tablet Take 1 tablet (10 mg total) by mouth at bedtime. 30 tablet 2   fluconazole (DIFLUCAN) 150 MG tablet Take 1 tablet (150 mg total) by mouth once a week. Take first tablet today and then repeat weekly. 4 tablet 0   fluocinonide (LIDEX) 0.05 % external solution Apply to aa's scalp QD-BID PRN. 60 mL 1   medroxyPROGESTERone (PROVERA) 10 MG tablet Take one by mouth daily until 01/31/20, then take one a day on day 1-14 each month starting 02/18/2020 60 tablet 2   metFORMIN (GLUCOPHAGE) 500 MG tablet Take 1 tablet (500 mg total) by mouth daily with breakfast. 30 tablet 5   nystatin (MYCOSTATIN/NYSTOP) powder Apply 1 application topically 2 (two) times daily. 15 g 0   spironolactone (ALDACTONE) 100 MG tablet Take 0.5 tablets (50 mg total) by mouth daily. Will start with 50 mg twice daily, and increase to 100 mg twice daily as needed 30 tablet 4   No facility-administered medications prior to visit.    Allergies  Allergen Reactions   Dilaudid [Hydromorphone] Itching   Codeine Hives   Tape Rash       Objective:    BP 112/77   Pulse 99   Ht 5\' 9"  (1.753 m)   Wt 242 lb 3.2 oz (109.9 kg)   SpO2 100%   BMI 35.77 kg/m  Wt Readings from Last 3 Encounters:  08/06/20 242 lb 3.2 oz (109.9 kg)  07/09/20 238 lb 1.6 oz (108 kg)  06/18/20 237 lb (107.5 kg)    Physical  Exam Vitals and nursing note reviewed.  Constitutional:      Appearance: She is well-developed.  HENT:     Head: Normocephalic and atraumatic.  Cardiovascular:     Rate and Rhythm: Normal rate and regular rhythm.     Pulses:          Dorsalis pedis pulses are 2+ on the right side and 2+ on the left side.       Posterior tibial pulses are 2+ on the right side and 2+ on the left side.     Heart sounds: Normal heart sounds.  No murmur heard.   No friction rub. No gallop.  Pulmonary:     Effort: Pulmonary effort is normal. No tachypnea or respiratory distress.     Breath sounds: Normal breath sounds. No decreased breath sounds, wheezing, rhonchi or rales.  Chest:     Chest wall: No tenderness.  Abdominal:     General: Bowel sounds are normal.     Palpations: Abdomen is soft.  Musculoskeletal:        General: Normal range of motion.     Cervical back: Normal range of motion.  Feet:     Right foot:     Toenail Condition: Fungal disease present.    Left foot:     Toenail Condition: Fungal disease present. Skin:    General: Skin is warm and dry.  Neurological:     Mental Status: She is alert and oriented to person, place, and time.     Coordination: Coordination normal.  Psychiatric:        Behavior: Behavior normal. Behavior is cooperative.        Thought Content: Thought content normal.        Judgment: Judgment normal.         Patient has been counseled extensively about nutrition and exercise as well as the importance of adherence with medications and regular follow-up. The patient was given clear instructions to go to ER or return to medical center if symptoms don't improve, worsen or new problems develop. The patient verbalized understanding.   Follow-up: Return for See Wynetta Emery in 2-3 weeks or first available. needs office visit.    Gildardo Pounds, FNP-BC [facility] and [facility] [place], [place]   08/06/2020, 3:02 PM

## 2020-08-07 ENCOUNTER — Emergency Department (HOSPITAL_COMMUNITY)
Admission: EM | Admit: 2020-08-07 | Discharge: 2020-08-07 | Disposition: A | Discharge status: Home / Self Care | Payer: 59 | Attending: Emergency Medicine | Admitting: Emergency Medicine

## 2020-08-07 ENCOUNTER — Emergency Department (HOSPITAL_COMMUNITY): Payer: 59

## 2020-08-07 DIAGNOSIS — M5441 Lumbago with sciatica, right side: Secondary | ICD-10-CM | POA: Insufficient documentation

## 2020-08-07 DIAGNOSIS — M545 Low back pain, unspecified: Secondary | ICD-10-CM | POA: Diagnosis present

## 2020-08-07 DIAGNOSIS — E119 Type 2 diabetes mellitus without complications: Secondary | ICD-10-CM | POA: Diagnosis not present

## 2020-08-07 DIAGNOSIS — Z7984 Long term (current) use of oral hypoglycemic drugs: Secondary | ICD-10-CM | POA: Insufficient documentation

## 2020-08-07 DIAGNOSIS — F17210 Nicotine dependence, cigarettes, uncomplicated: Secondary | ICD-10-CM | POA: Diagnosis not present

## 2020-08-07 LAB — PREGNANCY, URINE: Preg Test, Ur: NEGATIVE

## 2020-08-07 NOTE — ED Provider Notes (Signed)
[facility] EMERGENCY DEPARTMENT Provider Note   CSN: [id number] Arrival date & time: 08/07/20  1315     History Chief Complaint  Patient presents with   Back Pain    Heidi Bartlett is a 40 y.o. female with PMH/o pre-diabetes who presents for evaluation of lower back pain.  She reports she has an ongoing history of lower back pain.  States the pain usually radiates down into the right leg.  She reports over the last several weeks, she felt like her pain has been more constant.  She has also had intermittent episodes where she feels like her feet get tingly as well as sometimes her hands.  She has not any any preceding trauma, injury, fall.  She saw her primary care doctor yesterday and they were going to obtain an x-ray.  Patient came to the ED so she can get the x-ray done.  She has been taking muscle relaxers with some improvement in her symptoms but states she is continue to have pain.  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  She has not used any IV drug use since then.  She denies any fevers, weakness of her arms or legs, saddle anesthesia, urinary or bowel incontinence, history of HIV, history of cancer, difficulty ambulating.  The history is provided by the patient.      Past Medical History:  Diagnosis Date   Abnormal uterine bleeding (AUB)    Anxiety    Pre-diabetes     Patient Active Problem List   Diagnosis Date Noted   Prediabetes 01/03/2020   Obesity (BMI 30-39.9) 01/03/2020   Alopecia 01/03/2020   Right ovarian cyst 10/17/2019   Dysfunctional uterine bleeding 07/02/2018   Sciatica of right side 11/24/2017   Class 2 obesity due to excess calories without serious comorbidity with body mass index (BMI) of 39.0 to 39.9 in adult 11/24/2017   Tobacco dependence 11/24/2017    Past Surgical History:  Procedure Laterality Date   CESAREAN SECTION  2004   CHOLECYSTECTOMY  2004   LAPAROSCOPIC UNILATERAL SALPINGO  OOPHERECTOMY Right 11/15/2019   Procedure: LAPAROSCOPIC RIGHT SALPINGO OOPHORECTOMY AND RIGHT OVARIAN CYSTECTOMY;  Surgeon: Woodroe Mode, MD;  Location: [facility];  Service: Gynecology;  Laterality: Right;     OB History     Gravida  3   Para  1   Term      Preterm  1   AB  2   Living  0      SAB  1   IAB      Ectopic  1   Multiple      Live Births  1           Family History  Problem Relation Age of Onset   Breast cancer Mother     Social History   Tobacco Use   Smoking status: Every Day    Packs/day: 0.50    Pack years: 0.00    Types: Cigarettes   Smokeless tobacco: Never  Vaping Use   Vaping Use: Never used  Substance Use Topics   Alcohol use: Not Currently   Drug use: Not Currently    Home Medications Prior to Admission medications   Medication Sig Start Date End Date Taking? Authorizing Provider  clobetasol (TEMOVATE) 0.05 % external solution Apply 1 application topically as directed. Qd to bid aa scalp, avoid face, groin, axilla 03/12/20   Brendolyn Patty, MD  cyclobenzaprine (FLEXERIL) 10 MG tablet Take 1 tablet (10 mg total) by mouth at bedtime. 06/06/20   Ladell Pier, MD  fluconazole (DIFLUCAN) 150 MG tablet Take 1 tablet (150 mg total) by mouth once a week. Take first tablet today and then repeat weekly. 07/09/20   Chancy Milroy, MD  fluocinonide (LIDEX) 0.05 % external solution Apply to aa's scalp QD-BID PRN. 03/13/20   Brendolyn Patty, MD  gabapentin (NEURONTIN) 600 MG tablet Take 1 tablet (600 mg total) by mouth at bedtime. 08/06/20 09/05/20  Gildardo Pounds, NP  hydrOXYzine (ATARAX/VISTARIL) 10 MG tablet Take 1-2 tablets (10-20 mg total) by mouth 3 (three) times daily as needed. 08/06/20 09/05/20  Gildardo Pounds, NP  medroxyPROGESTERone (PROVERA) 10 MG tablet Take one by mouth daily until 01/31/20, then take one a day on day 1-14 each month starting 02/18/2020 06/18/20   Woodroe Mode, MD  metFORMIN (GLUCOPHAGE) 500 MG  tablet Take 1 tablet (500 mg total) by mouth daily with breakfast. 06/18/20   Woodroe Mode, MD  nystatin (MYCOSTATIN/NYSTOP) powder Apply 1 application topically 2 (two) times daily. 07/09/20   Chancy Milroy, MD  spironolactone (ALDACTONE) 100 MG tablet Take 0.5 tablets (50 mg total) by mouth daily. Will start with 50 mg twice daily, and increase to 100 mg twice daily as needed 06/18/20   Woodroe Mode, MD    Allergies    Dilaudid [hydromorphone], Codeine, and Tape  Review of Systems   Review of Systems  Constitutional:  Negative for fever.  Respiratory:  Negative for cough and shortness of breath.   Cardiovascular:  Negative for chest pain.  Gastrointestinal:  Negative for abdominal pain, nausea and vomiting.  Musculoskeletal:  Positive for back pain. Negative for neck pain.  Neurological:  Positive for numbness (tingling). Negative for weakness and headaches.  All other systems reviewed and are negative.  Physical Exam Updated Vital Signs BP 119/86 (BP Location: Right Arm)   Pulse 99   Temp 98.2 F (36.8 C) (Oral)   Resp 17   SpO2 98%   Physical Exam Vitals and nursing note reviewed.  Constitutional:      Appearance: She is well-developed.  HENT:     Head: Normocephalic and atraumatic.  Eyes:     General: No scleral icterus.       Right eye: No discharge.        Left eye: No discharge.     Conjunctiva/sclera: Conjunctivae normal.  Neck:     Comments: Full flexion/extension and lateral movement of neck fully intact. No bony midline tenderness. No deformities or crepitus.  Cardiovascular:     Pulses:          Radial pulses are 2+ on the right side and 2+ on the left side.       Dorsalis pedis pulses are 2+ on the right side and 2+ on the left side.  Pulmonary:     Effort: Pulmonary effort is normal.  Musculoskeletal:     Comments: Diffuse tenderness noted to the lower lumbar region. No deformity or crepitus. No step offs. No overlying warmth, erythema.   Skin:     General: Skin is warm and dry.  Neurological:     Mental Status: She is alert.     Comments: Follows commands, Moves all extremities  5/5 strength to BUE and BLE  Mild decreased sensation to dorsal aspect of bilateral feet. Otherwise sensation intact throughout all major nerve distributions Normal gait.   Psychiatric:  Speech: Speech normal.        Behavior: Behavior normal.    ED Results / Procedures / Treatments   Labs (all labs ordered are listed, but only abnormal results are displayed) Labs Reviewed  PREGNANCY, URINE    EKG None  Radiology DG Lumbar Spine Complete  Result Date: 08/07/2020 CLINICAL DATA:  Low back pain EXAM: LUMBAR SPINE - COMPLETE 4+ VIEW COMPARISON:  01/12/2018 FINDINGS: 11 mm anterolisthesis L5 on S1 with advanced degenerative change at L4-L5 and L5-S1. Chronic bilateral pars defect at L5. Vertebral body heights are normal. Remaining disc spaces are grossly patent. Facet degenerative change of the lower lumbar spine. IMPRESSION: 1. No acute osseous abnormality. 2. Similar 11 mm anterolisthesis L5 on S1 with bilateral pars defect at L5. Advanced degenerative changes at L4-L5 and L5-S1. Electronically Signed   By: Donavan Foil M.D.   On: 08/07/2020 18:42    Procedures Procedures   Medications Ordered in ED Medications - No data to display  ED Course  I have reviewed the triage vital signs and the nursing notes.  Pertinent labs & imaging results that were available during my care of the patient were reviewed by me and considered in my medical decision making (see chart for details).    MDM Rules/Calculators/A&P                          40 year old female who presents for evaluation of lower back pain.  She reports history of right lower back pain that extends into her right leg.  She states that been ongoing issue for a while.  She is seen her primary care doctor yesterday.  She was going to obtain an x-ray but patient states she came today to get  the x-ray.  No preceding trauma, injury.  No saddle anesthesia, urinary bowel incontinence.  No fevers.  History of IV drug use 10 years ago but states she has not used any IV drugs since then.  On initial arrival, she is afebrile, nontoxic-appearing.  Vital signs are stable.  On exam, she has diffuse tenderness in the lower lumbar region.  No overlying warmth, erythema.  She reports some diffuse tingling sensation to her bilateral feet.  She has good pulses, good cap refill.  History/physical exam not concerning for cauda equina, spinal abscess.  Doubt discitis.  Suspect this is most likely sciatica versus musculoskeletal pain.  Will obtain x-ray.  At this time, patient has no weakness.  She is able to ambulate without any difficulty.  She may need outpatient neurosurgery referral for further management of her symptoms.  X-ray reviewed.  No acute bony abnormality.  There is similar 11 mm anterolisthesis L5 on S1 with bilateral pars defect at L5.  She has advanced degenerative changes at L4-L5 S1.  I discussed results with patient.  Patient has been ambulatory here in the ED.  She has no weakness.  I discussed with her that the tingling sensation could be evidence of nerve irritation.  She has never seen a neurosurgeon.  We will give her outpatient neurosurgery referral.  Primary care doctor has already prescribed her muscle relaxers.  Encouraged her to keep taking this.  At this time, do not suspect that patient is suffering from cauda equina, spinal abscess, discitis.  No negation for acute/emergent MRI. At this time, patient exhibits no emergent life-threatening condition that require further evaluation in ED. Patient had ample opportunity for questions and discussion. All patient's questions were answered  with full understanding. Strict return precautions discussed. Patient expresses understanding and agreement to plan.   Portions of this note were generated with [employer]. Dictation errors  may occur despite best attempts at proofreading.   Final Clinical Impression(s) / ED Diagnoses Final diagnoses:  Acute bilateral low back pain with right-sided sciatica    Rx / DC Orders ED Discharge Orders     None        Desma Mcgregor 08/07/20 1952    Truddie Hidden, MD 08/07/20 [phone]

## 2020-08-07 NOTE — Discharge Instructions (Addendum)
Please follow up with your primary care doctor.   Follow up with the referred neurosurgery doctor.   Return to the Emergency Department immediately for any worsening back pain, neck pain, difficulty walking, numbness/weaknss of your arms or legs, urinary or bowel accidents, fever or any other worsening or concerning symptoms.

## 2020-08-07 NOTE — ED Triage Notes (Signed)
Pt reports R sided lower back pain with radiation down R leg x24 years when she injured it working at [employer] when she was 16. Has been seen for same but wants repeat x ray today.

## 2020-08-09 ENCOUNTER — Ambulatory Visit (INDEPENDENT_AMBULATORY_CARE_PROVIDER_SITE_OTHER): Payer: 59 | Admitting: Obstetrics & Gynecology

## 2020-08-09 ENCOUNTER — Encounter: Payer: Self-pay | Admitting: Obstetrics & Gynecology

## 2020-08-09 ENCOUNTER — Other Ambulatory Visit: Payer: Self-pay

## 2020-08-09 VITALS — BP 126/87 | HR 92 | Ht 69.0 in | Wt 238.4 lb

## 2020-08-09 DIAGNOSIS — E6609 Other obesity due to excess calories: Secondary | ICD-10-CM | POA: Diagnosis not present

## 2020-08-09 DIAGNOSIS — Z6839 Body mass index (BMI) 39.0-39.9, adult: Secondary | ICD-10-CM | POA: Diagnosis not present

## 2020-08-09 DIAGNOSIS — N938 Other specified abnormal uterine and vaginal bleeding: Secondary | ICD-10-CM

## 2020-08-09 DIAGNOSIS — R7303 Prediabetes: Secondary | ICD-10-CM

## 2020-08-09 NOTE — Progress Notes (Signed)
Pt states stopped taking Provera on 07/09/20 and didn't have any more bleeding since 07/12/20. Pt also states not starting OCPs at all yet due to making her bleed more.

## 2020-08-09 NOTE — Patient Instructions (Signed)
Polycystic Ovary Syndrome  Polycystic ovarian syndrome (PCOS) is a common hormonal disorder among women of reproductive age. In most women with PCOS, small fluid-filled sacs (cysts) grow on the ovaries. PCOS can cause problems with menstrual periods and make it hard to get and stay pregnant. If this condition is not treated, it can leadto serious health problems, such as diabetes and heart disease. What are the causes? The cause of this condition is not known. It may be due to certain factors, such as: Irregular menstrual cycle. High levels of certain hormones. Problems with the hormone that helps to control blood sugar (insulin). Certain genes. What increases the risk? You are more likely to develop this condition if you: Have a family history of PCOS or type 2 diabetes. Are overweight, eat unhealthy foods, and are not active. These factors may cause problems with blood sugar control, which can contribute to PCOS or PCOS symptoms. What are the signs or symptoms? Symptoms of this condition include: Ovarian cysts and sometimes pelvic pain. Menstrual periods that are not regular or are too heavy. Inability to get or stay pregnant. Increased growth of hair on the face, chest, stomach, back, thumbs, thighs, or toes. Acne or oily skin. Acne may develop during adulthood, and it may not get better with treatment. Weight gain or obesity. Patches of thickened and dark brown or black skin on the neck, arms, breasts, or thighs. How is this diagnosed? This condition is diagnosed based on: Your medical history. A physical exam that includes a pelvic exam. Your health care provider may look for areas of increased hair growth on your skin. Tests, such as: An ultrasound to check the ovaries for cysts and to view the lining of the uterus. Blood tests to check levels of sugar (glucose), female hormone (testosterone), and female hormones (estrogen and progesterone). How is this treated? There is no cure for  this condition, but treatment can help to manage symptoms and prevent more health problems from developing. Treatment varies depending onyour symptoms and if you want to have a baby or if you need birth control. Treatment may include: Making nutrition and lifestyle changes. Taking the progesterone hormone to start a menstrual period. Taking birth control pills to help you have regular menstrual periods. Taking medicines such as: Medicines to make you ovulate, if you want to get pregnant. Medicine to reduce extra hair growth. Having surgery in severe cases. This may involve making small holes in one or both of your ovaries. This decreases the amount of testosterone that your body makes. Follow these instructions at home: Take over-the-counter and prescription medicines only as told by your health care provider. Follow a healthy meal plan that includes lean proteins, complex carbohydrates, fresh fruits and vegetables, low-fat dairy products, healthy fats, and fiber. If you are overweight, lose weight as told by your health care provider. Your health care provider can determine how much weight loss is best for you and can help you lose weight safely. Keep all follow-up visits. This is important. Contact a health care provider if: Your symptoms do not get better with medicine. Your symptoms get worse or you develop new symptoms. Summary Polycystic ovarian syndrome (PCOS) is a common hormonal disorder among women of reproductive age. PCOS can cause problems with menstrual periods and make it hard to get and stay pregnant. If this condition is not treated, it can lead to serious health problems, such as diabetes and heart disease. There is no cure for this condition, but treatment can help  to manage symptoms and prevent more health problems from developing. This information is not intended to replace advice given to you by your health care provider. Make sure you discuss any questions you have with  your healthcare provider. Document Revised: 07/14/2019 Document Reviewed: 07/14/2019 Elsevier Patient Education  2022 [employer].

## 2020-08-09 NOTE — Progress Notes (Signed)
Patient ID: Heidi Bartlett, female   DOB: Jan 18, 1981, 40 y.o.   MRN: [id number]  Chief Complaint  Patient presents with   Follow-up    HPI Heidi Bartlett is a 40 y.o. female.  She stopped taking her provera as it did not help her bleeding. She wants to see how she does with metformin and aldactone. No LMP recorded. (Menstrual status: Irregular Periods).  HPI  Past Medical History:  Diagnosis Date   Abnormal uterine bleeding (AUB)    Anxiety    Pre-diabetes     Past Surgical History:  Procedure Laterality Date   CESAREAN SECTION  2004   CHOLECYSTECTOMY  2004   LAPAROSCOPIC UNILATERAL SALPINGO OOPHERECTOMY Right 11/15/2019   Procedure: LAPAROSCOPIC RIGHT SALPINGO OOPHORECTOMY AND RIGHT OVARIAN CYSTECTOMY;  Surgeon: Woodroe Mode, MD;  Location: [facility];  Service: Gynecology;  Laterality: Right;    Family History  Problem Relation Age of Onset   Breast cancer Mother     Social History Social History   Tobacco Use   Smoking status: Every Day    Packs/day: 0.50    Pack years: 0.00    Types: Cigarettes   Smokeless tobacco: Never  Vaping Use   Vaping Use: Never used  Substance Use Topics   Alcohol use: Not Currently   Drug use: Not Currently    Allergies  Allergen Reactions   Dilaudid [Hydromorphone] Itching   Codeine Hives   Tape Rash    Current Outpatient Medications  Medication Sig Dispense Refill   cyclobenzaprine (FLEXERIL) 10 MG tablet Take 1 tablet (10 mg total) by mouth at bedtime. 30 tablet 2   gabapentin (NEURONTIN) 600 MG tablet Take 1 tablet (600 mg total) by mouth at bedtime. 30 tablet 1   hydrOXYzine (ATARAX/VISTARIL) 10 MG tablet Take 1-2 tablets (10-20 mg total) by mouth 3 (three) times daily as needed. 60 tablet 1   metFORMIN (GLUCOPHAGE) 500 MG tablet Take 1 tablet (500 mg total) by mouth daily with breakfast. 30 tablet 5   spironolactone (ALDACTONE) 100 MG tablet Take 0.5 tablets (50 mg total) by mouth daily. Will  start with 50 mg twice daily, and increase to 100 mg twice daily as needed 30 tablet 4   medroxyPROGESTERone (PROVERA) 10 MG tablet Take one by mouth daily until 01/31/20, then take one a day on day 1-14 each month starting 02/18/2020 (Patient not taking: Reported on 08/09/2020) 60 tablet 2   nystatin (MYCOSTATIN/NYSTOP) powder Apply 1 application topically 2 (two) times daily. (Patient not taking: Reported on 08/09/2020) 15 g 0   No current facility-administered medications for this visit.    Review of Systems Review of Systems  Constitutional: Negative.   Genitourinary:  Positive for menstrual problem. Negative for pelvic pain, vaginal bleeding and vaginal discharge.   Blood pressure 126/87, pulse 92, height 5\' 9"  (1.753 m), weight 238 lb 6.4 oz (108.1 kg).  Physical Exam Physical Exam Vitals and nursing note reviewed.  Constitutional:      Appearance: Normal appearance. She is obese.  Pulmonary:     Effort: Pulmonary effort is normal.  Neurological:     Mental Status: She is alert.  Psychiatric:        Mood and Affect: Mood normal.        Behavior: Behavior normal.    Data Reviewed   Assessment Class 2 obesity due to excess calories without serious comorbidity with body mass index (BMI) of 39.0 to 39.9 in adult  Prediabetes  Dysfunctional uterine  bleeding   Plan Continue metformin and Aldactone and keep menstrual record. RTC 6 months    [name] 08/09/2020, 10:11 AM

## 2020-08-14 ENCOUNTER — Ambulatory Visit (INDEPENDENT_AMBULATORY_CARE_PROVIDER_SITE_OTHER): Payer: 59 | Admitting: Dermatology

## 2020-08-14 ENCOUNTER — Other Ambulatory Visit: Payer: Self-pay

## 2020-08-14 ENCOUNTER — Encounter: Payer: Self-pay | Admitting: Dermatology

## 2020-08-14 DIAGNOSIS — D2330 Other benign neoplasm of skin of unspecified part of face: Secondary | ICD-10-CM

## 2020-08-14 DIAGNOSIS — L639 Alopecia areata, unspecified: Secondary | ICD-10-CM

## 2020-08-14 DIAGNOSIS — D23111 Other benign neoplasm of skin of right upper eyelid, including canthus: Secondary | ICD-10-CM

## 2020-08-14 DIAGNOSIS — L720 Epidermal cyst: Secondary | ICD-10-CM | POA: Diagnosis not present

## 2020-08-14 DIAGNOSIS — L821 Other seborrheic keratosis: Secondary | ICD-10-CM

## 2020-08-14 DIAGNOSIS — L820 Inflamed seborrheic keratosis: Secondary | ICD-10-CM | POA: Diagnosis not present

## 2020-08-14 MED ORDER — TRETINOIN 0.05 % EX CREA: TOPICAL_CREAM | CUTANEOUS | 1 refills | Status: DC

## 2020-08-14 NOTE — Progress Notes (Signed)
Follow-Up Visit   Subjective  Heidi Bartlett is a 40 y.o. female who presents for the following: Alopecia Areata (Left crown, 3 month follow-up. Improving with IL injections. She has not used clobetasol solution recently.) and bumps (Face around eyes x years. Some come and go, some irritated.). Mom has similar condition on her eyelids.   The following portions of the chart were reviewed this encounter and updated as appropriate:        Review of Systems:  No other skin or systemic complaints except as noted in HPI or Assessment and Plan.  Objective  Well appearing patient in no apparent distress; mood and affect are within normal limits.  A focused examination was performed including face. Relevant physical exam findings are noted in the Assessment and Plan.  Scalp Mild hair thinning of the left crown with good regrowth.  periocular Smooth white papule(s).   Periocular Stuck-on, waxy, tan-brown papules -- Discussed benign etiology and prognosis.   Right Temple Erythematous keratotic or waxy stuck-on papule   R upper eyelid 2.84mm firm flesh papule   Assessment & Plan  Alopecia areata Scalp  Improving  Alopecia areata is a chronic autoimmune condition localized to the skin which affects hair follicles and causes hair loss, most commonly in the scalp.  Cause is unknown.  Can be unpredictable, difficult to treat, and may recur.  Treatment methods include use of topical and intralesional steroids to decrease inflammation to allow for hair regrowth.  Improved with ILK injections.  May restart clobetasol solution to AA 3x/wk until back to baseline hair density. Avoid face. Topical steroids (such as triamcinolone, fluocinolone, fluocinonide, mometasone, clobetasol, halobetasol, betamethasone, hydrocortisone) can cause thinning and lightening of the skin if they are used for too long in the same area. Your physician has selected the right strength medicine for your  problem and area affected on the body. Please use your medication only as directed by your physician to prevent side effects.    Milia periocular  /Comedonal Acne  Start tretinoin 0.05% cream Apply a small amount to AA face QHS as tolerated, starting QOHS  dsp 45g 1Rf. Avoid eyelids.   Topical retinoid medications like tretinoin/Retin-A, adapalene/Differin, tazarotene/Fabior, and Epiduo/Epiduo Forte can cause dryness and irritation when first started. Only apply a pea-sized amount to the entire affected area. Avoid applying it around the eyes, edges of mouth and creases at the nose. If you experience irritation, use a good moisturizer first and/or apply the medicine less often. If you are doing well with the medicine, you can increase how often you use it until you are applying every night. Be careful with sun protection while using this medication as it can make you sensitive to the sun. This medicine should not be used by pregnant women.    tretinoin (RETIN-A) 0.05 % cream - periocular Apply a small amount to affected areas face nightly as tolerated. May mix with moisturizer if irritating.  Seborrheic keratosis Periocular  vs Skin Tags.  Reassured benign age-related growth.  Recommend observation.  Discussed cryotherapy if spot(s) become irritated or inflamed.  Inflamed seborrheic keratosis Right Temple  Destruction of lesion - Right Temple  Destruction method: cryotherapy   Informed consent: discussed and consent obtained   Lesion destroyed using liquid nitrogen: Yes   Region frozen until ice ball extended beyond lesion: Yes   Outcome: patient tolerated procedure well with no complications   Post-procedure details: wound care instructions given   Additional details:  Prior to procedure, discussed risks  of blister formation, small wound, skin dyspigmentation, or rare scar following cryotherapy. Recommend Vaseline ointment to treated areas while healing.   Fibrous papule of  face R upper eyelid  Benign, observe.   Discussed shave removal if bothersome.   Return in about 10 weeks (around 10/23/2020) for milia/acne.  IJamesetta Orleans, CMA, am acting as scribe for Brendolyn Patty, MD .  Documentation: I have reviewed the above documentation for accuracy and completeness, and I agree with the above.  Brendolyn Patty MD

## 2020-08-14 NOTE — Patient Instructions (Addendum)
Topical steroids (such as triamcinolone, fluocinolone, fluocinonide, mometasone, clobetasol, halobetasol, betamethasone, hydrocortisone) can cause thinning and lightening of the skin if they are used for too long in the same area. Your physician has selected the right strength medicine for your problem and area affected on the body. Please use your medication only as directed by your physician to prevent side effects.   Cryotherapy Aftercare  Wash gently with soap and water everyday.   Apply Vaseline and Band-Aid daily until healed.   If you have any questions or concerns for your doctor, please call our main line at [phone] and press option 4 to reach your doctor's medical assistant. If no one answers, please leave a voicemail as directed and we will return your call as soon as possible. Messages left after 4 pm will be answered the following business day.   You may also send [place] a message via [place]. We typically respond to MyChart messages within 1-2 business days.  For prescription refills, please ask your pharmacy to contact our office. Our fax number is [phone].  If you have an urgent issue when the clinic is closed that cannot wait until the next business day, you can page your doctor at the number below.    Please note that while we do our best to be available for urgent issues outside of office hours, we are not available 24/7.   If you have an urgent issue and are unable to reach [place], you may choose to seek medical care at your doctor's office, retail clinic, urgent care center, or emergency room.  If you have a medical emergency, please immediately call 911 or go to the emergency department.  Pager Numbers  - Dr. Nehemiah Massed: [phone]  - Dr. Laurence Ferrari: [phone]  - Dr. Nicole Kindred: [phone]  In the event of inclement weather, please call our main line at [phone] for an update on the status of any delays or closures.  Dermatology Medication Tips: Please keep the  boxes that topical medications come in in order to help keep track of the instructions about where and how to use these. Pharmacies typically print the medication instructions only on the boxes and not directly on the medication tubes.   If your medication is too expensive, please contact our office at [phone] option 4 or send [place] a message through [place].   We are unable to tell what your co-pay for medications will be in advance as this is different depending on your insurance coverage. However, we may be able to find a substitute medication at lower cost or fill out paperwork to get insurance to cover a needed medication.   If a prior authorization is required to get your medication covered by your insurance company, please allow [place] 1-2 business days to complete this process.  Drug prices often vary depending on where the prescription is filled and some pharmacies may offer cheaper prices.  The website www.goodrx.com contains coupons for medications through different pharmacies. The prices here do not account for what the cost may be with help from insurance (it may be cheaper with your insurance), but the website can give you the price if you did not use any insurance.  - You can print the associated coupon and take it with your prescription to the pharmacy.  - You may also stop by our office during regular business hours and pick up a GoodRx coupon card.  - If you need your prescription sent electronically to a different pharmacy, notify our office through [facility] MyChart or by phone at [phone] option 4.

## 2020-08-24 ENCOUNTER — Ambulatory Visit: Payer: 59 | Admitting: Internal Medicine

## 2020-08-30 ENCOUNTER — Other Ambulatory Visit: Payer: Self-pay

## 2020-08-30 DIAGNOSIS — L720 Epidermal cyst: Secondary | ICD-10-CM

## 2020-08-30 MED ORDER — TRETINOIN 0.05 % EX CREA: TOPICAL_CREAM | CUTANEOUS | 1 refills | Status: DC

## 2020-09-01 LAB — GLUCOSE, POCT (MANUAL RESULT ENTRY): POC Glucose: 85 mg/dl (ref 70–99)

## 2020-09-03 ENCOUNTER — Other Ambulatory Visit: Payer: Self-pay

## 2020-09-03 DIAGNOSIS — L720 Epidermal cyst: Secondary | ICD-10-CM

## 2020-09-03 MED ORDER — TRETINOIN 0.05 % EX CREA: TOPICAL_CREAM | CUTANEOUS | 1 refills | Status: DC

## 2020-09-03 NOTE — Progress Notes (Signed)
Fax from [facility] states patient did not want to pay for medication so this was discontinued but now patient wants to fill RX.   Medication resent today.

## 2020-09-12 ENCOUNTER — Ambulatory Visit: Payer: Self-pay | Admitting: *Deleted

## 2020-09-12 ENCOUNTER — Other Ambulatory Visit: Payer: Self-pay

## 2020-09-12 ENCOUNTER — Ambulatory Visit (INDEPENDENT_AMBULATORY_CARE_PROVIDER_SITE_OTHER): Payer: 59 | Admitting: General Practice

## 2020-09-12 VITALS — BP 120/90 | HR 96 | Temp 98.0°F | Ht 68.0 in | Wt 239.0 lb

## 2020-09-12 DIAGNOSIS — M545 Low back pain, unspecified: Secondary | ICD-10-CM | POA: Diagnosis not present

## 2020-09-12 LAB — POCT URINALYSIS DIP (DEVICE)
Bilirubin Urine: NEGATIVE
Glucose, UA: NEGATIVE mg/dL
Hgb urine dipstick: NEGATIVE
Ketones, ur: NEGATIVE mg/dL
Leukocytes,Ua: NEGATIVE
Nitrite: NEGATIVE
Protein, ur: NEGATIVE mg/dL
Specific Gravity, Urine: >=1.03 (ref 1.005–1.030)
Urobilinogen, UA: 0.2 mg/dL (ref 0.0–1.0)
pH: 5.5 (ref 5.0–8.0)

## 2020-09-12 NOTE — Telephone Encounter (Signed)
Noted  

## 2020-09-12 NOTE — Progress Notes (Signed)
Patient presents to office today reporting intermittent flank pain since yesterday rated at a 7 currently. Patient states she has noticed she is urinating more frequently today but denies dysuria or incomplete emptying. UA unremarkable. Discussed pain could be muscular- recommended ibuprofen, heating pad & taking flexeril Rx tonight. Patient has appt with PCP Friday and will follow up then. Advised patient to go to ER if pain becomes constant/severe and associated with urinary symptoms or fever/chills. Patient verbalized understanding.  Koren Bound RN BSN 09/12/20

## 2020-09-12 NOTE — Telephone Encounter (Signed)
Reason for Disposition  [1] SEVERE pain (e.g., excruciating, scale 8-10) AND [2] present > 1 hour  Answer Assessment - Initial Assessment Questions 1. LOCATION: "Where does it hurt?" (e.g., left, right)     R side- mid back 2. ONSET: "When did the pain start?"     yesterday 3. SEVERITY: "How bad is the pain?" (e.g., Scale 1-10; mild, moderate, or severe)   - MILD (1-3): doesn't interfere with normal activities    - MODERATE (4-7): interferes with normal activities or awakens from sleep    - SEVERE (8-10): excruciating pain and patient unable to do normal activities (stays in bed)       severe 4. PATTERN: "Does the pain come and go, or is it constant?"      Comes and goes- sharp pain 5. CAUSE: "What do you think is causing the pain?"     Not sure 6. OTHER SYMPTOMS:  "Do you have any other symptoms?" (e.g., fever, abdominal pain, vomiting, leg weakness, burning with urination, blood in urine)     Strong odor with urine 7. PREGNANCY:  "Is there any chance you are pregnant?" "When was your last menstrual period?"     No- LMP- due now  Protocols used: Flank Pain-A-AH

## 2020-09-12 NOTE — Telephone Encounter (Signed)
Patient is calling to report severe pain on R mid back- patient thinks she could have UTI- strong odor to urine. Patient advised ED due to the severity of her pain. Patient states she will go.

## 2020-09-12 NOTE — Progress Notes (Signed)
Patient was assessed and managed by nursing staff during this encounter. I have reviewed the chart and agree with the documentation and plan. I have also made any necessary editorial changes.  Mora Bellman, MD 09/12/2020 3:34 PM

## 2020-09-14 ENCOUNTER — Ambulatory Visit: Payer: 59 | Attending: Internal Medicine | Admitting: Internal Medicine

## 2020-09-14 ENCOUNTER — Encounter: Payer: Self-pay | Admitting: Internal Medicine

## 2020-09-14 VITALS — BP 111/79 | HR 96 | Resp 16 | Wt 241.0 lb

## 2020-09-14 DIAGNOSIS — Z23 Encounter for immunization: Secondary | ICD-10-CM

## 2020-09-14 DIAGNOSIS — M5442 Lumbago with sciatica, left side: Secondary | ICD-10-CM

## 2020-09-14 DIAGNOSIS — F17210 Nicotine dependence, cigarettes, uncomplicated: Secondary | ICD-10-CM | POA: Diagnosis not present

## 2020-09-14 DIAGNOSIS — E669 Obesity, unspecified: Secondary | ICD-10-CM

## 2020-09-14 DIAGNOSIS — R7303 Prediabetes: Secondary | ICD-10-CM | POA: Diagnosis not present

## 2020-09-14 DIAGNOSIS — F17200 Nicotine dependence, unspecified, uncomplicated: Secondary | ICD-10-CM

## 2020-09-14 DIAGNOSIS — Z6836 Body mass index (BMI) 36.0-36.9, adult: Secondary | ICD-10-CM

## 2020-09-14 DIAGNOSIS — G8929 Other chronic pain: Secondary | ICD-10-CM

## 2020-09-14 DIAGNOSIS — I10 Essential (primary) hypertension: Secondary | ICD-10-CM | POA: Diagnosis not present

## 2020-09-14 DIAGNOSIS — M5441 Lumbago with sciatica, right side: Secondary | ICD-10-CM

## 2020-09-14 MED ORDER — CYCLOBENZAPRINE HCL 10 MG PO TABS: 10.0000 mg | ORAL_TABLET | Freq: Every day | ORAL | 2 refills | Status: DC

## 2020-09-14 NOTE — Progress Notes (Signed)
Patient ID: Heidi Bartlett, female    DOB: [birth year]  MRN: [id number]  CC: Hypertension and Back Pain   Subjective: Heidi Bartlett is a 40 y.o. female who presents for chronic ds management Her concerns today include:  History of obesity, tobacco dependence, chronic right-sided lower back pain, DUB, preDM.  Patient was seen in the ED 08/08/2019 with right lower back pain.  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  She was discharged home and told to be seen by a neurosurgeon.  She subsequently was seen by her gynecologist this month and reported pain in the right flank area x4 days.  UA was checked as she thought she may have had a urinary tract infection but this was negative.  Patient states she has been taking her muscle relaxant.  The pain is now better.  Initially it was 10/10.  Now it is down to 1-2/10.  She requests refill on Flexeril which helps with her chronic lumbar pain.  Started on Spironolactone by GYN for BP.  She has been taking it consistently.  She tries to limit salt in the foods.  No chest pains or shortness of breath.  Obesity/PreDM:  found to have preDM by GYN.  A1C  in April was 6.2.  Started on Metformin.  She has been taking it consistently.  Not getting in as much activity as she would like.  She would like to do better with her eating habits but not sure what she should be doing or what her portion sizes should be like.  Alopecia: I referred her to the dermatologist on last visit for a large bald spot on the top of her head.  She reports today that she has had much hair growth return to the area after receiving a series of steroid injections to the scalp by the dermatologist.    Tob dep: not ready to quit.   HM:  Had COVID booster at [facility].  Due for Pneumonia vaccine.  She is agreeable to receiving it today.. Patient Active Problem List   Diagnosis  Date Noted   Prediabetes 01/03/2020   Obesity (BMI 30-39.9) 01/03/2020   Alopecia 01/03/2020   Right ovarian cyst 10/17/2019   Dysfunctional uterine bleeding 07/02/2018   Sciatica of right side 11/24/2017   Class 2 obesity due to excess calories without serious comorbidity with body mass index (BMI) of 39.0 to 39.9 in adult 11/24/2017   Tobacco dependence 11/24/2017     Current Outpatient Medications on File Prior to Visit  Medication Sig Dispense Refill   cyclobenzaprine (FLEXERIL) 10 MG tablet Take 1 tablet (10 mg total) by mouth at bedtime. 30 tablet 2   metFORMIN (GLUCOPHAGE) 500 MG tablet Take 1 tablet (500 mg total) by mouth daily with breakfast. 30 tablet 5   nystatin (MYCOSTATIN/NYSTOP) powder Apply 1 application topically 2 (two) times daily. 15 g 0   spironolactone (ALDACTONE) 100 MG tablet Take 0.5 tablets (50 mg total) by mouth daily. Will start with 50 mg twice daily, and increase to 100 mg twice daily as needed 30 tablet 4   tretinoin (RETIN-A) 0.05 % cream Apply a small amount to affected areas face nightly as tolerated. May mix with moisturizer if irritating. 45 g 1   No current facility-administered medications on file prior to visit.    Allergies  Allergen Reactions   Dilaudid [Hydromorphone] Itching   Codeine Hives  Tape Rash    Social History   Socioeconomic History   Marital status: Married    Spouse name: Not on file   Number of children: 0   Years of education: 9th grade   Highest education level: Not on file  Occupational History   Occupation: unemployed  Tobacco Use   Smoking status: Every Day    Packs/day: 0.50    Types: Cigarettes   Smokeless tobacco: Never  Vaping Use   Vaping Use: Never used  Substance and Sexual Activity   Alcohol use: Not Currently   Drug use: Not Currently   Sexual activity: Yes    Birth control/protection: None  Other Topics Concern   Not on file  Social History Narrative   Not on file   Social Determinants of  Health   Financial Resource Strain: Not on file  Food Insecurity: No Food Insecurity   Worried About Running Out of Food in the Last Year: Never true   [place] in the Last Year: Never true  Transportation Needs: No Transportation Needs   Lack of Transportation (Medical): No   Lack of Transportation (Non-Medical): No  Physical Activity: Not on file  Stress: Not on file  Social Connections: Not on file  Intimate Partner Violence: Not on file    Family History  Problem Relation Age of Onset   Breast cancer Mother     Past Surgical History:  Procedure Laterality Date   CESAREAN SECTION  2004   CHOLECYSTECTOMY  2004   LAPAROSCOPIC UNILATERAL SALPINGO OOPHERECTOMY Right 11/15/2019   Procedure: LAPAROSCOPIC RIGHT SALPINGO OOPHORECTOMY AND RIGHT OVARIAN CYSTECTOMY;  Surgeon: Woodroe Mode, MD;  Location: [facility];  Service: Gynecology;  Laterality: Right;    ROS: Review of Systems Negative except as stated above  PHYSICAL EXAM: BP 111/79   Pulse 96   Resp 16   Wt 241 lb (109.3 kg)   SpO2 99%   BMI 36.64 kg/m   Wt Readings from Last 3 Encounters:  09/14/20 241 lb (109.3 kg)  09/12/20 239 lb (108.4 kg)  08/09/20 238 lb 6.4 oz (108.1 kg)    Physical Exam  General appearance - alert, well appearing, young obese Caucasian female and in no distress Mental status - normal mood, behavior, speech, dress, motor activity, and thought processes Neck - supple, no significant adenopathy Chest - clear to auscultation, no wheezes, rales or rhonchi, symmetric air entry Heart - normal rate, regular rhythm, normal S1, S2, no murmurs, rubs, clicks or gallops Extremities - peripheral pulses normal, no pedal edema, no clubbing or cyanosis   CMP Latest Ref Rng & Units 11/14/2019 01/10/2017  Glucose 70 - 99 mg/dL 123(H) 112(H)  BUN 6 - 20 mg/dL 6 9  Creatinine 0.44 - 1.00 mg/dL 0.84 0.74  Sodium 135 - 145 mmol/L 140 139  Potassium 3.5 - 5.1 mmol/L 4.1 4.3   Chloride 98 - 111 mmol/L 105 106  CO2 22 - 32 mmol/L 23 26  Calcium 8.9 - 10.3 mg/dL 9.7 9.0  Total Protein 6.5 - 8.1 g/dL - 6.7  Total Bilirubin 0.3 - 1.2 mg/dL - 0.5  Alkaline Phos 38 - 126 U/L - 50  AST 15 - 41 U/L - 42(H)  ALT 14 - 54 U/L - 49   Lipid Panel  No results found for: CHOL, TRIG, HDL, CHOLHDL, VLDL, LDLCALC, LDLDIRECT  CBC    Component Value Date/Time   WBC 10.2 06/18/2020 1650   WBC 7.6 01/10/2017 0929  RBC 5.04 06/18/2020 1650   RBC 4.32 01/10/2017 0929   HGB 13.3 06/18/2020 1650   HCT 42.0 06/18/2020 1650   PLT 199 06/18/2020 1650   MCV 83 06/18/2020 1650   MCH 26.4 (L) 06/18/2020 1650   MCH 29.9 01/10/2017 0929   MCHC 31.7 06/18/2020 1650   MCHC 32.7 01/10/2017 0929   RDW 14.0 06/18/2020 1650   LYMPHSABS 2.4 01/10/2017 0929   MONOABS 0.5 01/10/2017 0929   EOSABS 0.3 01/10/2017 0929   BASOSABS 0.1 01/10/2017 0929    ASSESSMENT AND PLAN: 1. Essential hypertension At goal.  Continue spironolactone and low-salt diet.  2. Obesity (BMI 35.0-39.9 without comorbidity) Encouraged her to get in some form of moderate intensity exercise at least 5 days a week for 30 minutes with a goal of 150 minutes total per week. Dietary counseling given as outlined below.  She is agreeable to referral to see the nutritionist. Follow a Healthy Eating Plan - Limit sugary drinks.  Avoid sodas, sweet tea, sport or energy drinks, or fruit drinks.  Drink water, lo-fat milk, or diet drinks. Limit snack foods.   Cut back on candy, cake, cookies, chips, ice cream.  These are a special treat, only in small amounts. Eat plenty of vegetables.  Especially dark green, red, and orange vegetables. Aim for at least 3 servings a day. More is better! Include fruit in your daily diet.  Whole fruit is much healthier than fruit juice! Limit "white" bread, "white" pasta, "white" rice.   Choose "100% whole grain" products, brown or wild rice. Avoid fatty meats. Try "Meatless Monday" and choose  eggs or beans one day a week.  When eating meat, choose lean meats like chicken, [place], and fish.  Grill, broil, or bake meats instead of frying, and eat poultry without the skin. Eat less salt.  Avoid frozen pizzas, frozen dinners and salty foods.  Use seasonings other than salt in cooking.  This can help blood pressure and keep you from swelling Beer, wine and liquor have calories.  If you can safely drink alcohol, limit to 1 drink per day for women, 2 drinks for men  - Amb ref to Medical Nutrition Therapy-MNT  3. Prediabetes See #2 above - Amb ref to Medical Nutrition Therapy-MNT  4. Tobacco dependence Advised to quit.  She is aware of health risks associated with smoking.  She is not ready to give a trial of quitting at this time.  5. Chronic bilateral low back pain with bilateral sciatica - cyclobenzaprine (FLEXERIL) 10 MG tablet; Take 1 tablet (10 mg total) by mouth at bedtime.  Dispense: 30 tablet; Refill: 2  6. Need for vaccination against Streptococcus pneumoniae Patient received Pneumovax 23 today.   Patient was given the opportunity to ask questions.  Patient verbalized understanding of the plan and was able to repeat key elements of the plan.   No orders of the defined types were placed in this encounter.    Requested Prescriptions    No prescriptions requested or ordered in this encounter    No follow-ups on file.  Karle Plumber, MD, FACP

## 2020-09-14 NOTE — Patient Instructions (Signed)

## 2020-10-09 ENCOUNTER — Other Ambulatory Visit: Payer: Self-pay | Admitting: Nurse Practitioner

## 2020-10-09 DIAGNOSIS — F32A Depression, unspecified: Secondary | ICD-10-CM

## 2020-10-09 DIAGNOSIS — G8929 Other chronic pain: Secondary | ICD-10-CM

## 2020-10-09 DIAGNOSIS — M5442 Lumbago with sciatica, left side: Secondary | ICD-10-CM

## 2020-10-09 NOTE — Telephone Encounter (Signed)
  Notes to clinic:  The original prescription was discontinued on 09/12/2020 by Shelly Coss, RN for the following reason: Patient Preference. Renewing this prescription may not be appropriate   Requested Prescriptions  Pending Prescriptions Disp Refills   gabapentin (NEURONTIN) 600 MG tablet [Pharmacy Med Name: Gabapentin 600 MG Oral Tablet] 30 tablet 0    Sig: TAKE 1 TABLET BY MOUTH AT BEDTIME     Neurology: Anticonvulsants - gabapentin Passed - 10/09/2020  2:37 PM      Passed - Valid encounter within last 12 months    Recent Outpatient Visits           3 weeks ago Essential hypertension   [place], MD   2 months ago Chronic bilateral low back pain with bilateral sciatica   [place] Gildardo Pounds, NP   5 months ago Vaginal candidiasis   [place], Zelda W, NP   9 months ago Dermatitis   [place] Ladell Pier, MD   9 months ago Nasal congestion   [place], Deborah B, MD       Future Appointments             In 2 weeks Brendolyn Patty, MD [place]   In 3 months Wynetta Emery Dalbert Batman, MD [place]

## 2020-10-11 ENCOUNTER — Ambulatory Visit: Payer: Self-pay | Admitting: *Deleted

## 2020-10-11 ENCOUNTER — Encounter: Payer: Self-pay | Admitting: Internal Medicine

## 2020-10-11 NOTE — Telephone Encounter (Signed)
Patient is calling to report she has cystic- lump L upper thigh- groin area. Patient thought it was ingrown hair infection- but warm compress has not helped. Patient was instructed to follow up with PCP per her GYN office. Patient advised no open appointment within disposition- advised UC for evaluation of area.

## 2020-10-11 NOTE — Telephone Encounter (Signed)
Reason for Disposition  [1] Swelling is red AND [2] size > 2 inches (5.0 cm) (Exception: itchy area of skin)  Answer Assessment - Initial Assessment Questions 1. APPEARANCE of SWELLING: "What does it look like?" (e.g., lymph node, insect bite, mole)     Has gotten larger- red and hard 2. SIZE: "How large is the swelling?" (e.g., inches, cm; or compare to size of pinhead, tip of pen, eraser, coin, pea, grape, ping pong ball)      Lime/lemon size 3. LOCATION: "Where is the swelling located?"     L upper leg- groin area 4. ONSET: "When did the swelling start?"     2 weeks 5. PAIN: "Is it painful?" If Yes, ask: "How much?"     Yes- moderate 6. ITCH: "Does it itch?" If Yes, ask: "How much?"     no 7. CAUSE: "What do you think caused the swelling?"     Unsure- thought ingrown hair- but not getting better 8. OTHER SYMPTOMS: "Do you have any other symptoms?" (e.g., fever)     No other symptoms- area was warm 2 days ago  Protocols used: Skin Lump or Localized Swelling-A-AH

## 2020-10-12 MED ORDER — SULFAMETHOXAZOLE-TRIMETHOPRIM 800-160 MG PO TABS: 1.0000 | ORAL_TABLET | Freq: Two times a day (BID) | ORAL | 0 refills | Status: DC

## 2020-10-12 NOTE — Telephone Encounter (Signed)
Contacted pt to scheduled MyChart appt pt states she was sent in antibotics was unable to go to the urgent care pt has been scheduled for 8/29 at 130 for mychart

## 2020-10-15 ENCOUNTER — Ambulatory Visit (HOSPITAL_BASED_OUTPATIENT_CLINIC_OR_DEPARTMENT_OTHER): Payer: 59 | Admitting: Internal Medicine

## 2020-10-15 ENCOUNTER — Other Ambulatory Visit: Payer: Self-pay | Admitting: Nurse Practitioner

## 2020-10-15 ENCOUNTER — Other Ambulatory Visit: Payer: Self-pay

## 2020-10-15 DIAGNOSIS — L02214 Cutaneous abscess of groin: Secondary | ICD-10-CM

## 2020-10-15 DIAGNOSIS — F32A Depression, unspecified: Secondary | ICD-10-CM

## 2020-10-15 NOTE — Progress Notes (Signed)
Patient ID: Heidi Bartlett, female   DOB: Nov 24, 1980, 40 y.o.   MRN: [id number] Virtual Visit via Telephone Note  I connected with Heidi Bartlett on 10/15/2020 at 1:37 PM by telephone and verified that I am speaking with the correct person using two identifiers  Location: Patient: home Provider: office  Participants: Myself Patient CMA: Heidi Bartlett interpreter:   I discussed the limitations, risks, security and privacy concerns of performing an evaluation and management service by telephone and the availability of in person appointments. I also discussed with the patient that there may be a patient responsible charge related to this service. The patient expressed understanding and agreed to proceed.   History of Present Illness: History of obesity, tobacco dependence, chronic right-sided lower back pain, DUB, preDM.  This is an UC visit  Pt c/o lump upper inner thigh LT  side close to groin area x few wks It increased inside last week and was painful and burning.  She sent me a Mychart message on 10/11/2020.  I prescribed Bactrim BID for 7 days on 10/12/20.  She started taking the med the same day.  Area started draining the following day. Pain has resolved and size decreased by 50%.  Outpatient Encounter Medications as of 10/15/2020  Medication Sig   cyclobenzaprine (FLEXERIL) 10 MG tablet Take 1 tablet (10 mg total) by mouth at bedtime.   metFORMIN (GLUCOPHAGE) 500 MG tablet Take 1 tablet (500 mg total) by mouth daily with breakfast.   nystatin (MYCOSTATIN/NYSTOP) powder Apply 1 application topically 2 (two) times daily.   spironolactone (ALDACTONE) 100 MG tablet Take 0.5 tablets (50 mg total) by mouth daily. Will start with 50 mg twice daily, and increase to 100 mg twice daily as needed   sulfamethoxazole-trimethoprim (BACTRIM DS) 800-160 MG tablet Take 1 tablet by mouth 2 (two) times daily.   tretinoin (RETIN-A) 0.05 % cream Apply a small amount to affected areas  face nightly as tolerated. May mix with moisturizer if irritating.   No facility-administered encounter medications on file as of 10/15/2020.      Observations/objective: No direct observation done as this was a telephone encounter  Assessment and Plan: 1. Abscess of groin, left And is spontaneously ruptured and is draining on its own.  Patient reports improvement since it is draining and since being on antibiotics.  Advised patient to continue the Bactrim antibiotics to completion.  If at the end of the antibiotics she is still having drainage, I told her she may need to go out to urgent care to have it lanced and further drained.  She expressed understanding.   Follow Up Instructions: PRN   I discussed the assessment and treatment plan with the patient. The patient was provided an opportunity to ask questions and all were answered. The patient agreed with the plan and demonstrated an understanding of the instructions.   The patient was advised to call back or seek an in-person evaluation if the symptoms worsen or if the condition fails to improve as anticipated.  I  Spent 7 minutes on this telephone encounter  Karle Plumber, MD

## 2020-10-15 NOTE — Telephone Encounter (Signed)
Requested medications are due for refill today.  Unknown  Requested medications are on the active medications list.  no  Last refill. Unknown  Future visit scheduled.  yes  Notes to clinic.  Medication is not on current med list or historical med list.

## 2020-10-18 ENCOUNTER — Telehealth: Payer: Self-pay | Admitting: Internal Medicine

## 2020-10-18 NOTE — Telephone Encounter (Signed)
Copied from [place] [id number]. Topic: General - Other >> Oct 15, 2020  4:13 PM Tessa Lerner A wrote: Reason for CRM: The patient would like to be contacted regarding prescription coordination when possible  The patient has concerns related to prescriptions but no longer sees the original prescribing doctor  Please contact further when possible   The patient declined to schedule an additional appt because they were seen today 10/15/20

## 2020-10-19 MED ORDER — HYDROXYZINE PAMOATE 25 MG PO CAPS: 25.0000 mg | ORAL_CAPSULE | Freq: Two times a day (BID) | ORAL | 2 refills | Status: DC | PRN

## 2020-10-19 MED ORDER — GABAPENTIN 600 MG PO TABS: 600.0000 mg | ORAL_TABLET | Freq: Every day | ORAL | 2 refills | Status: DC

## 2020-10-19 NOTE — Telephone Encounter (Signed)
Pt seen Heidi Bartlett on 6/20 and was prescribed gabapentin and  hydrOXYzine (and pt is requesting a referral to be sent to [facility] on [address]. Please follow up

## 2020-10-19 NOTE — Telephone Encounter (Signed)
Rfs sent on Gabapentin and hydroxyzine.  She was placed on these medicines back in June by Geryl Rankins for anxiety.

## 2020-10-23 ENCOUNTER — Other Ambulatory Visit: Payer: Self-pay

## 2020-10-23 ENCOUNTER — Ambulatory Visit (INDEPENDENT_AMBULATORY_CARE_PROVIDER_SITE_OTHER): Payer: 59 | Admitting: Dermatology

## 2020-10-23 DIAGNOSIS — L720 Epidermal cyst: Secondary | ICD-10-CM

## 2020-10-23 DIAGNOSIS — L700 Acne vulgaris: Secondary | ICD-10-CM | POA: Diagnosis not present

## 2020-10-23 NOTE — Patient Instructions (Addendum)
Continue tretinoin 0.05% cream QHS  Topical retinoid medications like tretinoin/Retin-A, adapalene/Differin, tazarotene/Fabior, and Epiduo/Epiduo Forte can cause dryness and irritation when first started. Only apply a pea-sized amount to the entire affected area. Avoid applying it around the eyes, edges of mouth and creases at the nose. If you experience irritation, use a good moisturizer first and/or apply the medicine less often. If you are doing well with the medicine, you can increase how often you use it until you are applying every night. Be careful with sun protection while using this medication as it can make you sensitive to the sun. This medicine should not be used by pregnant women.   If you have any questions or concerns for your doctor, please call our main line at [phone] and press option 4 to reach your doctor's medical assistant. If no one answers, please leave a voicemail as directed and we will return your call as soon as possible. Messages left after 4 pm will be answered the following business day.   You may also send [place] a message via [place]. We typically respond to MyChart messages within 1-2 business days.  For prescription refills, please ask your pharmacy to contact our office. Our fax number is [phone].  If you have an urgent issue when the clinic is closed that cannot wait until the next business day, you can page your doctor at the number below.    Please note that while we do our best to be available for urgent issues outside of office hours, we are not available 24/7.   If you have an urgent issue and are unable to reach [place], you may choose to seek medical care at your doctor's office, retail clinic, urgent care center, or emergency room.  If you have a medical emergency, please immediately call 911 or go to the emergency department.  Pager Numbers  - Dr. Nehemiah Massed: [phone]  - Dr. Laurence Ferrari: [phone]  - Dr. Nicole Kindred: [phone]  In the event of  inclement weather, please call our main line at [phone] for an update on the status of any delays or closures.  Dermatology Medication Tips: Please keep the boxes that topical medications come in in order to help keep track of the instructions about where and how to use these. Pharmacies typically print the medication instructions only on the boxes and not directly on the medication tubes.   If your medication is too expensive, please contact our office at [phone] option 4 or send [place] a message through [place].   We are unable to tell what your co-pay for medications will be in advance as this is different depending on your insurance coverage. However, we may be able to find a substitute medication at lower cost or fill out paperwork to get insurance to cover a needed medication.   If a prior authorization is required to get your medication covered by your insurance company, please allow [place] 1-2 business days to complete this process.  Drug prices often vary depending on where the prescription is filled and some pharmacies may offer cheaper prices.  The website www.goodrx.com contains coupons for medications through different pharmacies. The prices here do not account for what the cost may be with help from insurance (it may be cheaper with your insurance), but the website can give you the price if you did not use any insurance.  - You can print the associated coupon and take it with your prescription to the pharmacy.  - You may also stop by our office  during regular business hours and pick up a GoodRx coupon card.  - If you need your prescription sent electronically to a different pharmacy, notify our office through [facility] or by phone at [phone] option 4.

## 2020-10-23 NOTE — Progress Notes (Signed)
   Follow-Up Visit   Subjective  Heidi Bartlett is a 40 y.o. female who presents for the following: Follow-up (Patient here today for 10 week acne/milia follow up at periocular. Patient has been using tretinoin 0.05% at bedtime. Patient advises the areas seemed to be improving but has gotten worse again. The ones at right eye have been burning and bothering patient. ).    The following portions of the chart were reviewed this encounter and updated as appropriate:       Review of Systems:  No other skin or systemic complaints except as noted in HPI or Assessment and Plan.  Objective  Well appearing patient in no apparent distress; mood and affect are within normal limits.  A focused examination was performed including face. Relevant physical exam findings are noted in the Assessment and Plan.  Right Upper Eyelid x 3 (3) Firm white papules x 3  face Open and closed comedones at BL periocular cheeks/temple areas   Assessment & Plan  Milia (3) Right Upper Eyelid x 3  Irritated, symptomatic, extraction today  Continue tretinoin 0.05% cream QHS as tolerated  Topical retinoid medications like tretinoin/Retin-A, adapalene/Differin, tazarotene/Fabior, and Epiduo/Epiduo Forte can cause dryness and irritation when first started. Only apply a pea-sized amount to the entire affected area. Avoid applying it around the eyes, edges of mouth and creases at the nose. If you experience irritation, use a good moisturizer first and/or apply the medicine less often. If you are doing well with the medicine, you can increase how often you use it until you are applying every night. Be careful with sun protection while using this medication as it can make you sensitive to the sun. This medicine should not be used by pregnant women.    Acne/Milia surgery - Right Upper Eyelid x 3 Procedure risks and benefits were discussed with the patient and verbal consent was obtained. Following prep of the skin  on the right upper eyelid with an alcohol swab and local anesthesia injection, extraction of milia was performed with a comedone extractor following superficial incision made over their surfaces with a #11 surgical blade. Capillary hemostasis was achieved with pressure. Vaseline ointment was applied to each site. The patient tolerated the procedure well.  Related Medications tretinoin (RETIN-A) 0.05 % cream Apply a small amount to affected areas face nightly as tolerated. May mix with moisturizer if irritating.  Acne vulgaris face  Comedonal  Continue tretinoin 0.05% cream QHS as tolerated  Topical retinoid medications like tretinoin/Retin-A, adapalene/Differin, tazarotene/Fabior, and Epiduo/Epiduo Forte can cause dryness and irritation when first started. Only apply a pea-sized amount to the entire affected area. Avoid applying it around the eyes, edges of mouth and creases at the nose. If you experience irritation, use a good moisturizer first and/or apply the medicine less often. If you are doing well with the medicine, you can increase how often you use it until you are applying every night. Be careful with sun protection while using this medication as it can make you sensitive to the sun. This medicine should not be used by pregnant women.    Return if symptoms worsen or fail to improve.  Graciella Belton, RMA, am acting as scribe for Brendolyn Patty, MD .  Documentation: I have reviewed the above documentation for accuracy and completeness, and I agree with the above.  Brendolyn Patty MD

## 2020-10-25 ENCOUNTER — Ambulatory Visit: Payer: 59 | Admitting: Skilled Nursing Facility1

## 2020-11-19 ENCOUNTER — Other Ambulatory Visit: Payer: Self-pay | Admitting: *Deleted

## 2020-11-19 DIAGNOSIS — E6609 Other obesity due to excess calories: Secondary | ICD-10-CM

## 2020-11-19 DIAGNOSIS — N938 Other specified abnormal uterine and vaginal bleeding: Secondary | ICD-10-CM

## 2020-11-19 MED ORDER — METFORMIN HCL 500 MG PO TABS: 500.0000 mg | ORAL_TABLET | Freq: Every day | ORAL | 3 refills | Status: DC

## 2020-11-19 MED ORDER — SPIRONOLACTONE 100 MG PO TABS
50.0000 mg | ORAL_TABLET | Freq: Every day | ORAL | 4 refills | Status: DC
Start: 2020-11-19 — End: 2021-04-05

## 2020-11-29 ENCOUNTER — Other Ambulatory Visit: Payer: Self-pay | Admitting: Obstetrics & Gynecology

## 2020-11-29 DIAGNOSIS — N938 Other specified abnormal uterine and vaginal bleeding: Secondary | ICD-10-CM

## 2020-11-29 DIAGNOSIS — E6609 Other obesity due to excess calories: Secondary | ICD-10-CM

## 2020-12-11 ENCOUNTER — Other Ambulatory Visit: Payer: Self-pay

## 2020-12-11 ENCOUNTER — Encounter: Payer: 59 | Admitting: Skilled Nursing Facility1

## 2020-12-11 NOTE — Progress Notes (Signed)
Pt states she does not want to be here and is not ready for anykind of change so she has nothing to gain from an appt with [facility]

## 2020-12-20 ENCOUNTER — Encounter: Payer: Self-pay | Admitting: Internal Medicine

## 2020-12-21 ENCOUNTER — Other Ambulatory Visit: Payer: Self-pay | Admitting: Pharmacist

## 2020-12-21 DIAGNOSIS — G8929 Other chronic pain: Secondary | ICD-10-CM

## 2020-12-21 MED ORDER — CYCLOBENZAPRINE HCL 10 MG PO TABS: 10.0000 mg | ORAL_TABLET | Freq: Every day | ORAL | 2 refills | Status: DC

## 2021-01-07 ENCOUNTER — Ambulatory Visit: Payer: 59 | Admitting: Internal Medicine

## 2021-01-08 ENCOUNTER — Other Ambulatory Visit: Payer: Self-pay | Admitting: Internal Medicine

## 2021-01-08 ENCOUNTER — Other Ambulatory Visit: Payer: Self-pay

## 2021-01-08 MED ORDER — HYDROXYZINE PAMOATE 25 MG PO CAPS: ORAL_CAPSULE | ORAL | 2 refills | Status: DC

## 2021-01-08 MED ORDER — GABAPENTIN 600 MG PO TABS: 600.0000 mg | ORAL_TABLET | Freq: Every day | ORAL | 2 refills | Status: DC

## 2021-01-24 ENCOUNTER — Encounter: Payer: Self-pay | Admitting: Obstetrics & Gynecology

## 2021-01-24 ENCOUNTER — Other Ambulatory Visit: Payer: Self-pay

## 2021-01-24 ENCOUNTER — Ambulatory Visit (INDEPENDENT_AMBULATORY_CARE_PROVIDER_SITE_OTHER): Payer: 59 | Admitting: Obstetrics & Gynecology

## 2021-01-24 VITALS — BP 122/81 | HR 101 | Wt 243.5 lb

## 2021-01-24 DIAGNOSIS — F32A Depression, unspecified: Secondary | ICD-10-CM

## 2021-01-24 DIAGNOSIS — N938 Other specified abnormal uterine and vaginal bleeding: Secondary | ICD-10-CM

## 2021-01-24 DIAGNOSIS — F419 Anxiety disorder, unspecified: Secondary | ICD-10-CM

## 2021-01-24 MED ORDER — MEDROXYPROGESTERONE ACETATE 10 MG PO TABS: ORAL_TABLET | ORAL | 10 refills | Status: DC

## 2021-01-24 NOTE — Progress Notes (Signed)
Patient ID: Heidi Bartlett, female   DOB: 10-Jul-1980, 40 y.o.   MRN: [id number]  Chief Complaint  Patient presents with   Gynecologic Exam    HPI Heidi Bartlett is a 40 y.o. female.  [id number] No LMP recorded. (Menstrual status: Irregular Periods). Last period was about 4+ months ago after stopping Provera and taking metformin.  HPI  Past Medical History:  Diagnosis Date   Abnormal uterine bleeding (AUB)    Anxiety    Pre-diabetes     Past Surgical History:  Procedure Laterality Date   CESAREAN SECTION  2004   CHOLECYSTECTOMY  2004   LAPAROSCOPIC UNILATERAL SALPINGO OOPHERECTOMY Right 11/15/2019   Procedure: LAPAROSCOPIC RIGHT SALPINGO OOPHORECTOMY AND RIGHT OVARIAN CYSTECTOMY;  Surgeon: Woodroe Mode, MD;  Location: [facility];  Service: Gynecology;  Laterality: Right;    Family History  Problem Relation Age of Onset   Breast cancer Mother     Social History Social History   Tobacco Use   Smoking status: Every Day    Packs/day: 0.50    Types: Cigarettes   Smokeless tobacco: Never  Vaping Use   Vaping Use: Never used  Substance Use Topics   Alcohol use: Not Currently   Drug use: Not Currently    Allergies  Allergen Reactions   Dilaudid [Hydromorphone] Itching   Codeine Hives   Tape Rash    Current Outpatient Medications  Medication Sig Dispense Refill   cyclobenzaprine (FLEXERIL) 10 MG tablet Take 1 tablet (10 mg total) by mouth at bedtime. 30 tablet 2   gabapentin (NEURONTIN) 600 MG tablet Take 1 tablet (600 mg total) by mouth at bedtime. 30 tablet 2   hydrOXYzine (VISTARIL) 25 MG capsule TAKE 1 CAPSULE BY MOUTH TWICE DAILY AS NEEDED FOR ANXIETY 30 capsule 2   metFORMIN (GLUCOPHAGE) 500 MG tablet Take 1 tablet (500 mg total) by mouth daily with breakfast. 30 tablet 3   nystatin (MYCOSTATIN/NYSTOP) powder Apply 1 application topically 2 (two) times daily. 15 g 0   spironolactone (ALDACTONE) 100 MG tablet Take 0.5 tablets (50 mg  total) by mouth daily. Will start with 50 mg twice daily, and increase to 100 mg twice daily as needed 30 tablet 4   sulfamethoxazole-trimethoprim (BACTRIM DS) 800-160 MG tablet Take 1 tablet by mouth 2 (two) times daily. 14 tablet 0   tretinoin (RETIN-A) 0.05 % cream Apply a small amount to affected areas face nightly as tolerated. May mix with moisturizer if irritating. 45 g 1   medroxyPROGESTERone (PROVERA) 10 MG tablet Take one by mouth daily until 01/31/20, then take one a day on day 1-14 each month starting 02/18/2020 60 tablet 10   No current facility-administered medications for this visit.    Review of Systems Review of Systems  Constitutional: Negative.   Gastrointestinal: Negative.   Genitourinary: Negative.   Neurological:        Nerve pain   Blood pressure 122/81, pulse (!) 101, weight 243 lb 8 oz (110.5 kg).  Physical Exam Physical Exam Vitals and nursing note reviewed.  Constitutional:      Appearance: She is obese.  Neurological:     General: No focal deficit present.     Mental Status: She is alert.  Psychiatric:        Mood and Affect: Mood normal.        Behavior: Behavior normal.    Data Reviewed   Assessment DUB, PCOS  Plan Continue metformin and aldactone Meds ordered this encounter  Medications   medroxyPROGESTERone (PROVERA) 10 MG tablet    Sig: Take one by mouth daily until 01/31/20, then take one a day on day 1-14 each month starting 02/18/2020    Dispense:  60 tablet    Refill:  10   RTC 6 months    [name] 01/24/2021, 1:54 PM

## 2021-01-25 NOTE — Addendum Note (Signed)
Addended by: Mena Goes on: 01/25/2021 12:16 PM   Modules accepted: Orders

## 2021-01-29 ENCOUNTER — Encounter: Payer: Self-pay | Admitting: Internal Medicine

## 2021-01-29 ENCOUNTER — Other Ambulatory Visit: Payer: Self-pay

## 2021-01-29 ENCOUNTER — Ambulatory Visit: Payer: 59 | Attending: Internal Medicine | Admitting: Internal Medicine

## 2021-01-29 VITALS — BP 116/80 | HR 97 | Ht 69.0 in | Wt 247.4 lb

## 2021-01-29 DIAGNOSIS — R7303 Prediabetes: Secondary | ICD-10-CM

## 2021-01-29 DIAGNOSIS — E669 Obesity, unspecified: Secondary | ICD-10-CM | POA: Diagnosis not present

## 2021-01-29 DIAGNOSIS — F17200 Nicotine dependence, unspecified, uncomplicated: Secondary | ICD-10-CM

## 2021-01-29 DIAGNOSIS — I10 Essential (primary) hypertension: Secondary | ICD-10-CM

## 2021-01-29 NOTE — Patient Instructions (Signed)
Healthy Eating °Following a healthy eating pattern may help you to achieve and maintain a healthy body weight, reduce the risk of chronic disease, and live a long and productive life. It is important to follow a healthy eating pattern at an appropriate calorie level for your body. Your nutritional needs should be met primarily through food by choosing a variety of nutrient-rich foods. °What are tips for following this plan? °Reading food labels °Read labels and choose the following: °Reduced or low sodium. °Juices with 100% fruit juice. °Foods with low saturated fats and high polyunsaturated and monounsaturated fats. °Foods with whole grains, such as whole wheat, cracked wheat, brown rice, and wild rice. °Whole grains that are fortified with folic acid. This is recommended for women who are pregnant or who want to become pregnant. °Read labels and avoid the following: °Foods with a lot of added sugars. These include foods that contain brown sugar, corn sweetener, corn syrup, dextrose, fructose, glucose, high-fructose corn syrup, honey, invert sugar, lactose, malt syrup, maltose, molasses, raw sugar, sucrose, trehalose, or turbinado sugar. °Do not eat more than the following amounts of added sugar per day: °6 teaspoons (25 g) for women. °9 teaspoons (38 g) for men. °Foods that contain processed or refined starches and grains. °Refined grain products, such as white flour, degermed cornmeal, white bread, and white rice. °Shopping °Choose nutrient-rich snacks, such as vegetables, whole fruits, and nuts. Avoid high-calorie and high-sugar snacks, such as potato chips, fruit snacks, and candy. °Use oil-based dressings and spreads on foods instead of solid fats such as butter, stick margarine, or cream cheese. °Limit pre-made sauces, mixes, and "instant" products such as flavored rice, instant noodles, and ready-made pasta. °Try more plant-protein sources, such as tofu, tempeh, black beans, edamame, lentils, nuts, and  seeds. °Explore eating plans such as the Mediterranean diet or vegetarian diet. °Cooking °Use oil to sauté or stir-fry foods instead of solid fats such as butter, stick margarine, or lard. °Try baking, boiling, grilling, or broiling instead of frying. °Remove the fatty part of meats before cooking. °Steam vegetables in water or broth. °Meal planning ° °At meals, imagine dividing your plate into fourths: °One-half of your plate is fruits and vegetables. °One-fourth of your plate is whole grains. °One-fourth of your plate is protein, especially lean meats, poultry, eggs, tofu, beans, or nuts. °Include low-fat dairy as part of your daily diet. °Lifestyle °Choose healthy options in all settings, including home, work, school, restaurants, or stores. °Prepare your food safely: °Wash your hands after handling raw meats. °Keep food preparation surfaces clean by regularly washing with hot, soapy water. °Keep raw meats separate from ready-to-eat foods, such as fruits and vegetables. °Cook seafood, meat, poultry, and eggs to the recommended internal temperature. °Store foods at safe temperatures. In general: °Keep cold foods at 40°F (4.4°C) or below. °Keep hot foods at 140°F (60°C) or above. °Keep your freezer at 0°F (-17.8°C) or below. °Foods are no longer safe to eat when they have been between the temperatures of 40°-140°F (4.4-60°C) for more than 2 hours. °What foods should I eat? °Fruits °Aim to eat 2 cup-equivalents of fresh, canned (in natural juice), or frozen fruits each day. Examples of 1 cup-equivalent of fruit include 1 small apple, 8 large strawberries, 1 cup canned fruit, ½ cup dried fruit, or 1 cup 100% juice. °Vegetables °Aim to eat 2½-3 cup-equivalents of fresh and frozen vegetables each day, including different varieties and colors. Examples of 1 cup-equivalent of vegetables include 2 medium carrots, 2 cups raw,   leafy greens, 1 cup chopped vegetable (raw or cooked), or 1 medium baked potato. °Grains °Aim to  eat 6 ounce-equivalents of whole grains each day. Examples of 1 ounce-equivalent of grains include 1 slice of bread, 1 cup ready-to-eat cereal, 3 cups popcorn, or ½ cup cooked rice, pasta, or cereal. °Meats and other proteins °Aim to eat 5-6 ounce-equivalents of protein each day. Examples of 1 ounce-equivalent of protein include 1 egg, 1/2 cup nuts or seeds, or 1 tablespoon (16 g) peanut butter. A cut of meat or fish that is the size of a deck of cards is about 3-4 ounce-equivalents. °Of the protein you eat each week, try to have at least 8 ounces come from seafood. This includes salmon, trout, herring, and anchovies. °Dairy °Aim to eat 3 cup-equivalents of fat-free or low-fat dairy each day. Examples of 1 cup-equivalent of dairy include 1 cup (240 mL) milk, 8 ounces (250 g) yogurt, 1½ ounces (44 g) natural cheese, or 1 cup (240 mL) fortified soy milk. °Fats and oils °Aim for about 5 teaspoons (21 g) per day. Choose monounsaturated fats, such as canola and olive oils, avocados, peanut butter, and most nuts, or polyunsaturated fats, such as sunflower, corn, and soybean oils, walnuts, pine nuts, sesame seeds, sunflower seeds, and flaxseed. °Beverages °Aim for six 8-oz glasses of water per day. Limit coffee to three to five 8-oz cups per day. °Limit caffeinated beverages that have added calories, such as soda and energy drinks. °Limit alcohol intake to no more than 1 drink a day for nonpregnant women and 2 drinks a day for men. One drink equals 12 oz of beer (355 mL), 5 oz of wine (148 mL), or 1½ oz of hard liquor (44 mL). °Seasoning and other foods °Avoid adding excess amounts of salt to your foods. Try flavoring foods with herbs and spices instead of salt. °Avoid adding sugar to foods. °Try using oil-based dressings, sauces, and spreads instead of solid fats. °This information is based on general [place] nutrition guidelines. For more information, visit choosemyplate.gov. Exact amounts may vary based on your nutrition  needs. °Summary °A healthy eating plan may help you to maintain a healthy weight, reduce the risk of chronic diseases, and stay active throughout your life. °Plan your meals. Make sure you eat the right portions of a variety of nutrient-rich foods. °Try baking, boiling, grilling, or broiling instead of frying. °Choose healthy options in all settings, including home, work, school, restaurants, or stores. °This information is not intended to replace advice given to you by your health care provider. Make sure you discuss any questions you have with your health care provider. °Document Revised: 10/02/2020 Document Reviewed: 10/02/2020 °Elsevier Patient Education © 2022 [employer] ° °

## 2021-01-29 NOTE — Progress Notes (Signed)
Patient ID: Heidi Bartlett, female    DOB: [birth year]  MRN: [id number]  CC: Hypertension   Subjective: Heidi Bartlett is a 40 y.o. female who presents for chronic ds management Her concerns today include:  History of HTN, obesity, tob dep, chronic right-sided lower back pain, DUB/PCOS, preDM.  DUB:  saw Dr. Roselie Awkward recently.  Restarted on Provera to take for the 1st 2 wks of every month.  Was taken off it for 4-5 mths  earlier this yr because she was having persistent vaginal bleeding.  Patient states she was told by Dr. Roselie Awkward to take the Provera for the first 10 days of every month but the prescription said the first 14 days.  She is not sure which direction to follow. Taking Spironolactone every day for blood pressure and for PCOS.  Tob dep:  not ready to quit as yet.  Her spouse also smokes  PreDM/obesity:  Referred to nutritionist on last visit.  Went to appt 12/11/2020 but left because she felt the RD was rude to her. Wants to hold off on new referral as she will be changing insurance in the new yr.  Admits that she needs to do better with her eating habits.  Not getting in much exercise.  Her weight is up 7 pounds from her last in person visit with me in July.  Patient Active Problem List   Diagnosis Date Noted   Essential hypertension 09/14/2020   Prediabetes 01/03/2020   Obesity (BMI 30-39.9) 01/03/2020   Alopecia 01/03/2020   Right ovarian cyst 10/17/2019   Dysfunctional uterine bleeding 07/02/2018   Sciatica of right side 11/24/2017   Class 2 obesity due to excess calories without serious comorbidity with body mass index (BMI) of 39.0 to 39.9 in adult 11/24/2017   Tobacco dependence 11/24/2017     Current Outpatient Medications on File Prior to Visit  Medication Sig Dispense Refill   cyclobenzaprine (FLEXERIL) 10 MG tablet Take 1 tablet (10 mg total) by mouth at bedtime. 30 tablet 2   gabapentin (NEURONTIN) 600 MG tablet Take 1 tablet (600 mg total) by mouth at  bedtime. 30 tablet 2   hydrOXYzine (VISTARIL) 25 MG capsule TAKE 1 CAPSULE BY MOUTH TWICE DAILY AS NEEDED FOR ANXIETY 30 capsule 2   medroxyPROGESTERone (PROVERA) 10 MG tablet Take one by mouth daily until 01/31/20, then take one a day on day 1-14 each month starting 02/18/2020 60 tablet 10   metFORMIN (GLUCOPHAGE) 500 MG tablet Take 1 tablet (500 mg total) by mouth daily with breakfast. 30 tablet 3   nystatin (MYCOSTATIN/NYSTOP) powder Apply 1 application topically 2 (two) times daily. 15 g 0   spironolactone (ALDACTONE) 100 MG tablet Take 0.5 tablets (50 mg total) by mouth daily. Will start with 50 mg twice daily, and increase to 100 mg twice daily as needed 30 tablet 4   sulfamethoxazole-trimethoprim (BACTRIM DS) 800-160 MG tablet Take 1 tablet by mouth 2 (two) times daily. 14 tablet 0   tretinoin (RETIN-A) 0.05 % cream Apply a small amount to affected areas face nightly as tolerated. May mix with moisturizer if irritating. 45 g 1   No current facility-administered medications on file prior to visit.    Allergies  Allergen Reactions   Dilaudid [Hydromorphone] Itching   Codeine Hives   Tape Rash    Social History   Socioeconomic History   Marital status: Married    Spouse name: Not on file   Number of children: 0   Years of  education: 9th grade   Highest education level: Not on file  Occupational History   Occupation: unemployed  Tobacco Use   Smoking status: Every Day    Packs/day: 0.50    Types: Cigarettes   Smokeless tobacco: Never  Vaping Use   Vaping Use: Never used  Substance and Sexual Activity   Alcohol use: Not Currently   Drug use: Not Currently   Sexual activity: Yes    Birth control/protection: None  Other Topics Concern   Not on file  Social History Narrative   Not on file   Social Determinants of Health   Financial Resource Strain: Not on file  Food Insecurity: Food Insecurity Present   Worried About [place] in the Last Year: Sometimes true    Ran Out of Food in the Last Year: Never true  Transportation Needs: No Transportation Needs   Lack of Transportation (Medical): No   Lack of Transportation (Non-Medical): No  Physical Activity: Not on file  Stress: Not on file  Social Connections: Not on file  Intimate Partner Violence: Not on file    Family History  Problem Relation Age of Onset   Breast cancer Mother     Past Surgical History:  Procedure Laterality Date   CESAREAN SECTION  2004   CHOLECYSTECTOMY  2004   LAPAROSCOPIC UNILATERAL SALPINGO OOPHERECTOMY Right 11/15/2019   Procedure: LAPAROSCOPIC RIGHT SALPINGO OOPHORECTOMY AND RIGHT OVARIAN CYSTECTOMY;  Surgeon: Woodroe Mode, MD;  Location: Center;  Service: Gynecology;  Laterality: Right;    ROS: Review of Systems Negative except as stated above  PHYSICAL EXAM: BP 116/80    Pulse 97    Ht 5\' 9"  (1.753 m)    Wt 247 lb 6.4 oz (112.2 kg)    SpO2 100%    BMI 36.53 kg/m   Wt Readings from Last 3 Encounters:  01/29/21 247 lb 6.4 oz (112.2 kg)  01/24/21 243 lb 8 oz (110.5 kg)  09/14/20 241 lb (109.3 kg)    Physical Exam  General appearance - alert, well appearing, and in no distress Mental status - normal mood, behavior, speech, dress, motor activity, and thought processes Neck - supple, no significant adenopathy Chest - clear to auscultation, no wheezes, rales or rhonchi, symmetric air entry Heart - normal rate, regular rhythm, normal S1, S2, no murmurs, rubs, clicks or gallops Extremities - peripheral pulses normal, no pedal edema, no clubbing or cyanosis   CMP Latest Ref Rng & Units 11/14/2019 01/10/2017  Glucose 70 - 99 mg/dL 123(H) 112(H)  BUN 6 - 20 mg/dL 6 9  Creatinine 0.44 - 1.00 mg/dL 0.84 0.74  Sodium 135 - 145 mmol/L 140 139  Potassium 3.5 - 5.1 mmol/L 4.1 4.3  Chloride 98 - 111 mmol/L 105 106  CO2 22 - 32 mmol/L 23 26  Calcium 8.9 - 10.3 mg/dL 9.7 9.0  Total Protein 6.5 - 8.1 g/dL - 6.7  Total Bilirubin 0.3 - 1.2  mg/dL - 0.5  Alkaline Phos 38 - 126 U/L - 50  AST 15 - 41 U/L - 42(H)  ALT 14 - 54 U/L - 49   Lipid Panel  No results found for: CHOL, TRIG, HDL, CHOLHDL, VLDL, LDLCALC, LDLDIRECT  CBC    Component Value Date/Time   WBC 10.2 06/18/2020 1650   WBC 7.6 01/10/2017 0929   RBC 5.04 06/18/2020 1650   RBC 4.32 01/10/2017 0929   HGB 13.3 06/18/2020 1650   HCT 42.0 06/18/2020 1650   PLT  199 06/18/2020 1650   MCV 83 06/18/2020 1650   MCH 26.4 (L) 06/18/2020 1650   MCH 29.9 01/10/2017 0929   MCHC 31.7 06/18/2020 1650   MCHC 32.7 01/10/2017 0929   RDW 14.0 06/18/2020 1650   LYMPHSABS 2.4 01/10/2017 0929   MONOABS 0.5 01/10/2017 0929   EOSABS 0.3 01/10/2017 0929   BASOSABS 0.1 01/10/2017 0929    ASSESSMENT AND PLAN: 1. Obesity (BMI 35.0-39.9 without comorbidity) Discussed and encourage healthy eating habits.  Patient advised to eliminate sugary drinks from the diet and drink mainly water, cut back on portion sizes especially of white carbohydrates, eat more lean white meat instead of red meat or pork and incorporate fresh fruits and vegetables into the diet daily.  Also advised getting in at least 150 minutes/week total of moderate intensity exercise.  2. Prediabetes See #1 above.  3. Essential hypertension Controlled.  Continue spironolactone.  4. Tobacco dependence She is aware of health risks associated with smoking.  Advised to quit.  She is not ready to give a trial of quitting.  In regards to her question about the Provera, I recommend that she is in Dr. Astrid Drafts a MyChart message inquiring whether he wants her to take the Provera for the first 10 days or the first 14 days of every month.  Patient was given the opportunity to ask questions.  Patient verbalized understanding of the plan and was able to repeat key elements of the plan.   No orders of the defined types were placed in this encounter.    Requested Prescriptions    No prescriptions requested or ordered in this  encounter    No follow-ups on file.  Karle Plumber, MD, FACP

## 2021-02-13 ENCOUNTER — Other Ambulatory Visit: Payer: Self-pay

## 2021-02-13 ENCOUNTER — Ambulatory Visit (INDEPENDENT_AMBULATORY_CARE_PROVIDER_SITE_OTHER): Payer: 59 | Admitting: Licensed Clinical Social Worker

## 2021-02-13 DIAGNOSIS — F419 Anxiety disorder, unspecified: Secondary | ICD-10-CM

## 2021-02-13 DIAGNOSIS — F32A Depression, unspecified: Secondary | ICD-10-CM

## 2021-02-13 NOTE — BH Specialist Note (Signed)
Integrated Behavioral Health Initial In-Person Visit  MRN: [id number] Name: [facility]  Number of [place] Clinician visits:: 1/6 Session Start time: 10:00am  Session End time: 10:33am Total time: 33 minutes in person at [facility]   Types of Service: Individual psychotherapy  Interpretor:No. Interpretor Name and Language: none    Warm Hand Off Completed.        Subjective: Heidi Bartlett is a 40 y.o. female accompanied by n/a Patient was referred by Eddie North MD  for anxiety and depression. Patient reports the following symptoms/concerns: anxious mood, negative self image, excessive worry, and social isolation. Duration of problem: approx one year ; Severity of problem: mild  Objective: Mood: Anxious and Affect: Appropriate Risk of harm to self or others: No plan to harm self or others  Life Context: Family and Social: Lives with spouse/extended and immediate family lives in [place] School/Work: n/a Self-Care: n/a Life Changes: moved to [place]   Patient and/or Family's Strengths/Protective Factors: Social connections and Sense of purpose  Goals Addressed: Patient will: Reduce symptoms of: anxiety Increase knowledge and/or ability of: coping skills  Demonstrate ability to: Increase adequate support systems for patient/family  Progress towards Goals: Ongoing  Interventions: Interventions utilized: Supportive Counseling  Standardized Assessments completed: PHQ 9  Assessment: Patient currently experiencing general anxiety disorder.   Patient may benefit from medication mgmt and outpatient therapy.  Plan: Follow up with behavioral health clinician on : as needed  Behavioral recommendations: Engage with community mental health for therapy services, began in activities and hobbies of interest to decrease social isolation Referral(s): community outpatient therapy "From scale of 1-10, how likely are you to follow plan?":   Lynnea Ferrier, LCSW

## 2021-03-05 ENCOUNTER — Other Ambulatory Visit: Payer: Self-pay | Admitting: Internal Medicine

## 2021-03-05 NOTE — Telephone Encounter (Signed)
Requested Prescriptions  Pending Prescriptions Disp Refills   hydrOXYzine (VISTARIL) 25 MG capsule [Pharmacy Med Name: hydrOXYzine Pamoate 25 MG Oral Capsule] 30 capsule 0    Sig: TAKE 1 CAPSULE BY MOUTH TWICE DAILY AS NEEDED FOR ANXIETY     Ear, Nose, and Throat:  Antihistamines Passed - 03/05/2021  7:06 PM      Passed - Valid encounter within last 12 months    Recent Outpatient Visits          1 month ago Obesity (BMI 35.0-39.9 without comorbidity)   [place] Ladell Pier, MD   4 months ago Abscess of groin, left   [place], MD   5 months ago Essential hypertension   [place], MD   7 months ago Chronic bilateral low back pain with bilateral sciatica   [place] Gildardo Pounds, NP   10 months ago Vaginal candidiasis   [place], Vernia Buff, NP      Future Appointments            In 3 months Wynetta Emery, Dalbert Batman, MD [place]

## 2021-03-14 ENCOUNTER — Ambulatory Visit: Payer: 59 | Admitting: Internal Medicine

## 2021-03-23 ENCOUNTER — Other Ambulatory Visit: Payer: Self-pay | Admitting: Internal Medicine

## 2021-03-23 MED ORDER — HYDROXYZINE PAMOATE 25 MG PO CAPS: ORAL_CAPSULE | ORAL | 1 refills | Status: DC

## 2021-03-27 ENCOUNTER — Other Ambulatory Visit: Payer: Self-pay | Admitting: Obstetrics & Gynecology

## 2021-03-27 ENCOUNTER — Other Ambulatory Visit: Payer: Self-pay | Admitting: Internal Medicine

## 2021-03-27 DIAGNOSIS — N938 Other specified abnormal uterine and vaginal bleeding: Secondary | ICD-10-CM

## 2021-03-27 DIAGNOSIS — G8929 Other chronic pain: Secondary | ICD-10-CM

## 2021-03-27 DIAGNOSIS — Z6839 Body mass index (BMI) 39.0-39.9, adult: Secondary | ICD-10-CM

## 2021-03-27 DIAGNOSIS — E6609 Other obesity due to excess calories: Secondary | ICD-10-CM

## 2021-03-27 DIAGNOSIS — M5442 Lumbago with sciatica, left side: Secondary | ICD-10-CM

## 2021-03-28 NOTE — Telephone Encounter (Signed)
Requested medication (s) are due for refill today: no, requesting too early  Requested medication (s) are on the active medication list: yes  Last refill:  12/21/20 #30 with 2 RF  Future visit scheduled: 06/11/21  Notes to clinic:  This medication can not be delegated, please assess.    Requested Prescriptions  Pending Prescriptions Disp Refills   cyclobenzaprine (FLEXERIL) 10 MG tablet [Pharmacy Med Name: Cyclobenzaprine HCl 10 MG Oral Tablet] 30 tablet 0    Sig: TAKE 1 TABLET BY MOUTH AT BEDTIME     Not Delegated - Analgesics:  Muscle Relaxants Failed - 03/27/2021  7:50 PM      Failed - This refill cannot be delegated      Passed - Valid encounter within last 6 months    Recent Outpatient Visits           1 month ago Obesity (BMI 35.0-39.9 without comorbidity)   [place] Ladell Pier, MD   5 months ago Abscess of groin, left   [place], MD   6 months ago Essential hypertension   [place], MD   7 months ago Chronic bilateral low back pain with bilateral sciatica   [place] Gildardo Pounds, NP   11 months ago Vaginal candidiasis   [place], Vernia Buff, NP       Future Appointments             In 2 months Wynetta Emery, Dalbert Batman, MD [place]

## 2021-04-03 ENCOUNTER — Encounter: Payer: Self-pay | Admitting: Obstetrics & Gynecology

## 2021-04-05 ENCOUNTER — Encounter: Payer: Self-pay | Admitting: Obstetrics & Gynecology

## 2021-04-05 ENCOUNTER — Other Ambulatory Visit: Payer: Self-pay

## 2021-04-05 DIAGNOSIS — E6609 Other obesity due to excess calories: Secondary | ICD-10-CM

## 2021-04-05 DIAGNOSIS — N938 Other specified abnormal uterine and vaginal bleeding: Secondary | ICD-10-CM

## 2021-04-05 MED ORDER — METFORMIN HCL 500 MG PO TABS: 500.0000 mg | ORAL_TABLET | Freq: Every day | ORAL | 11 refills | Status: DC

## 2021-04-05 MED ORDER — SPIRONOLACTONE 100 MG PO TABS: 100.0000 mg | ORAL_TABLET | Freq: Two times a day (BID) | ORAL | 11 refills | Status: DC

## 2021-04-17 ENCOUNTER — Other Ambulatory Visit: Payer: Self-pay | Admitting: Internal Medicine

## 2021-04-17 NOTE — Telephone Encounter (Signed)
Needs labs on next visit. Last labs 11/14/19  ?Requested Prescriptions  ?Pending Prescriptions Disp Refills  ?? hydrOXYzine (VISTARIL) 25 MG capsule [Pharmacy Med Name: hydrOXYzine Pamoate 25 MG Oral Capsule] 30 capsule 0  ?  Sig: TAKE 1 CAPSULE BY MOUTH TWICE DAILY AS NEEDED FOR ANXIETY  ?  ? Ear, Nose, and Throat:  Antihistamines 2 Failed - 04/17/2021 11:35 AM  ?  ?  Failed - Cr in normal range and within 360 days  ?  Creatinine, Ser  ?Date Value Ref Range Status  ?11/14/2019 0.84 0.44 - 1.00 mg/dL Final  ?   ?  ?  Passed - Valid encounter within last 12 months  ?  Recent Outpatient Visits   ?      ? 2 months ago Obesity (BMI 35.0-39.9 without comorbidity)  ? [place] Karle Plumber B, MD  ? 6 months ago Abscess of groin, left  ? [place] Ladell Pier, MD  ? 7 months ago Essential hypertension  ? [place] Karle Plumber B, MD  ? 8 months ago Chronic bilateral low back pain with bilateral sciatica  ? [place], [place] W, NP  ? 11 months ago Vaginal candidiasis  ? [place] Gildardo Pounds, NP  ?  ?  ?Future Appointments   ?        ? In 1 month Ladell Pier, MD [place]  ?  ? ?  ?  ?  ? ?

## 2021-04-23 ENCOUNTER — Other Ambulatory Visit: Payer: Self-pay

## 2021-04-23 ENCOUNTER — Ambulatory Visit: Payer: Self-pay | Admitting: Physician Assistant

## 2021-04-23 VITALS — BP 134/82 | HR 95 | Resp 18 | Ht 68.0 in | Wt 247.0 lb

## 2021-04-23 DIAGNOSIS — T3695XA Adverse effect of unspecified systemic antibiotic, initial encounter: Secondary | ICD-10-CM

## 2021-04-23 DIAGNOSIS — B379 Candidiasis, unspecified: Secondary | ICD-10-CM

## 2021-04-23 DIAGNOSIS — R7303 Prediabetes: Secondary | ICD-10-CM

## 2021-04-23 DIAGNOSIS — L02429 Furuncle of limb, unspecified: Secondary | ICD-10-CM

## 2021-04-23 MED ORDER — SULFAMETHOXAZOLE-TRIMETHOPRIM 800-160 MG PO TABS
1.0000 | ORAL_TABLET | Freq: Two times a day (BID) | ORAL | 0 refills | Status: AC
Start: 2021-04-23 — End: 2021-04-28

## 2021-04-23 MED ORDER — ACCU-CHEK AVIVA PLUS W/DEVICE KIT
1.0000 [IU] | PACK | Freq: Once | 0 refills | Status: AC
Start: 2021-04-23 — End: 2021-04-23

## 2021-04-23 MED ORDER — FLUCONAZOLE 150 MG PO TABS: 150.0000 mg | ORAL_TABLET | Freq: Once | ORAL | 0 refills | Status: AC

## 2021-04-23 MED ORDER — ACCU-CHEK SOFTCLIX LANCETS MISC: 12 refills | Status: DC

## 2021-04-23 MED ORDER — ACCU-CHEK AVIVA PLUS VI STRP: ORAL_STRIP | 12 refills | Status: DC

## 2021-04-23 NOTE — Patient Instructions (Signed)
You are going to take Bactrim twice a day for the next 5 days, I encourage you to keep the area clean and dry. ? ?You will have Diflucan available if you do develop a yeast infection. ? ?I sent a prescription for a blood glucometer to your pharmacy, I do encourage you to start checking your blood sugars on a daily basis, keep a written log and have available for all office visits. ? ?I encourage you to return to the mobile unit next week for follow-up, if you return early in the morning, I encourage you to be fasting so we can check your cholesterol 2. ? ?Kennieth Rad, PA-C ?Physician Assistant ?[place] ?[email or web address] ? ? ?Skin Abscess ?A skin abscess is an infected area on or under your skin that contains a collection of pus and other material. An abscess may also be called a furuncle, carbuncle, or boil. An abscess can occur in or on almost any part of your body. ?Some abscesses break open (rupture) on their own. Most continue to get worse unless they are treated. The infection can spread deeper into the body and eventually into your blood, which can make you feel ill. Treatment usually involves draining the abscess. ?What are the causes? ?An abscess occurs when germs, like bacteria, pass through your skin and cause an infection. This may be caused by: ?A scrape or cut on your skin. ?A puncture wound through your skin, including a needle injection or insect bite. ?Blocked oil or sweat glands. ?Blocked and infected hair follicles. ?A cyst that forms beneath your skin (sebaceous cyst) and becomes infected. ?What increases the risk? ?This condition is more likely to develop in people who: ?Have a weak body defense system (immune system). ?Have diabetes. ?Have dry and irritated skin. ?Get frequent injections or use illegal IV drugs. ?Have a foreign body in a wound, such as a splinter. ?Have problems with their lymph system or veins. ?What are the signs  or symptoms? ?Symptoms of this condition include: ?A painful, firm bump under the skin. ?A bump with pus at the top. This may break through the skin and drain. ?Other symptoms include: ?Redness surrounding the abscess site. ?Warmth. ?Swelling of the lymph nodes (glands) near the abscess. ?Tenderness. ?A sore on the skin. ?How is this diagnosed? ?This condition may be diagnosed based on: ?A physical exam. ?Your medical history. ?A sample of pus. This may be used to find out what is causing the infection. ?Blood tests. ?Imaging tests, such as an ultrasound, CT scan, or MRI. ?How is this treated? ?A small abscess that drains on its own may not need treatment. Treatment for larger abscesses may include: ?Moist heat or heat pack applied to the area several times a day. ?A procedure to drain the abscess (incision and drainage). ?Antibiotic medicines. For a severe abscess, you may first get antibiotics through an IV and then change to antibiotics by mouth. ?Follow these instructions at home: ?Medicines ? ?Take over-the-counter and prescription medicines only as told by your health care provider. ?If you were prescribed an antibiotic medicine, take it as told by your health care provider. Do not stop taking the antibiotic even if you start to feel better. ?Abscess care ? ?If you have an abscess that has not drained, apply heat to the affected area. Use the heat source that your health care provider recommends, such as a moist heat pack or a heating pad. ?Place a towel between your skin and the heat source. ?Leave the  heat on for 20-30 minutes. ?Remove the heat if your skin turns bright red. This is especially important if you are unable to feel pain, heat, or cold. You may have a greater risk of getting burned. ?Follow instructions from your health care provider about how to take care of your abscess. Make sure you: ?Cover the abscess with a bandage (dressing). ?Change your dressing or gauze as told by your health care  provider. ?Wash your hands with soap and water before you change the dressing or gauze. If soap and water are not available, use hand sanitizer. ?Check your abscess every day for signs of a worsening infection. Check for: ?More redness, swelling, or pain. ?More fluid or blood. ?Warmth. ?More pus or a bad smell. ?General instructions ?To avoid spreading the infection: ?Do not share personal care items, towels, or hot tubs with others. ?Avoid making skin contact with other people. ?Keep all follow-up visits as told by your health care provider. This is important. ?Contact a health care provider if you have: ?More redness, swelling, or pain around your abscess. ?More fluid or blood coming from your abscess. ?Warm skin around your abscess. ?More pus or a bad smell coming from your abscess. ?A fever. ?Muscle aches. ?Chills or a general ill feeling. ?Get help right away if you: ?Have severe pain. ?See red streaks on your skin spreading away from the abscess. ?Summary ?A skin abscess is an infected area on or under your skin that contains a collection of pus and other material. ?A small abscess that drains on its own may not need treatment. ?Treatment for larger abscesses may include having a procedure to drain the abscess and taking an antibiotic. ?This information is not intended to replace advice given to you by your health care provider. Make sure you discuss any questions you have with your health care provider. ?Document Revised: 05/27/2018 Document Reviewed: 03/19/2017 ?Elsevier Patient Education ? [place]. ? ?

## 2021-04-23 NOTE — Progress Notes (Signed)
Established Patient Office Visit  Subjective:  Patient ID: Heidi Bartlett, female    DOB: 1980-12-30  Age: 41 y.o. MRN: [id number]  CC:  Chief Complaint  Patient presents with   thigh pain    Knot on inner thigh    HPI Heidi Bartlett reports that she started having tenderness on her right inner thigh near her bikini line approximately 2 to 3 days ago, states that she noticed she started to get a bump, states that she did squeeze it and some pus came out.  States that it continues to have tenderness.  Reports that she has tried putting Neosporin on it without much relief.   Past Medical History:  Diagnosis Date   Abnormal uterine bleeding (AUB)    Anxiety    Pre-diabetes     Past Surgical History:  Procedure Laterality Date   CESAREAN SECTION  2004   CHOLECYSTECTOMY  2004   LAPAROSCOPIC UNILATERAL SALPINGO OOPHERECTOMY Right 11/15/2019   Procedure: LAPAROSCOPIC RIGHT SALPINGO OOPHORECTOMY AND RIGHT OVARIAN CYSTECTOMY;  Surgeon: Woodroe Mode, MD;  Location: [facility];  Service: Gynecology;  Laterality: Right;    Family History  Problem Relation Age of Onset   Breast cancer Mother     Social History   Socioeconomic History   Marital status: Married    Spouse name: Not on file   Number of children: 0   Years of education: 9th grade   Highest education level: Not on file  Occupational History   Occupation: unemployed  Tobacco Use   Smoking status: Every Day    Packs/day: 0.50    Types: Cigarettes   Smokeless tobacco: Never  Vaping Use   Vaping Use: Never used  Substance and Sexual Activity   Alcohol use: Not Currently   Drug use: Not Currently   Sexual activity: Yes    Birth control/protection: None  Other Topics Concern   Not on file  Social History Narrative   Not on file   Social Determinants of Health   Financial Resource Strain: Not on file  Food Insecurity: Food Insecurity Present   Worried About [place] in the Last Year: Sometimes true   [place] in the Last Year: Never true  Transportation Needs: No Transportation Needs   Lack of Transportation (Medical): No   Lack of Transportation (Non-Medical): No  Physical Activity: Not on file  Stress: Not on file  Social Connections: Not on file  Intimate Partner Violence: Not on file    Outpatient Medications Prior to Visit  Medication Sig Dispense Refill   cyclobenzaprine (FLEXERIL) 10 MG tablet TAKE 1 TABLET BY MOUTH AT BEDTIME 30 tablet 2   gabapentin (NEURONTIN) 600 MG tablet Take 1 tablet (600 mg total) by mouth at bedtime. 30 tablet 2   hydrOXYzine (VISTARIL) 25 MG capsule TAKE 1 CAPSULE BY MOUTH TWICE DAILY AS NEEDED FOR ANXIETY 30 capsule 0   medroxyPROGESTERone (PROVERA) 10 MG tablet Take one by mouth daily until 01/31/20, then take one a day on day 1-14 each month starting 02/18/2020 60 tablet 10   metFORMIN (GLUCOPHAGE) 500 MG tablet Take 1 tablet (500 mg total) by mouth daily with breakfast. 30 tablet 11   nystatin (MYCOSTATIN/NYSTOP) powder Apply 1 application topically 2 (two) times daily. 15 g 0   spironolactone (ALDACTONE) 100 MG tablet Take 1 tablet (100 mg total) by mouth 2 (two) times daily. 60 tablet 11   sulfamethoxazole-trimethoprim (BACTRIM DS) 800-160 MG tablet  Take 1 tablet by mouth 2 (two) times daily. 14 tablet 0   tretinoin (RETIN-A) 0.05 % cream Apply a small amount to affected areas face nightly as tolerated. May mix with moisturizer if irritating. 45 g 1   No facility-administered medications prior to visit.    Allergies  Allergen Reactions   Dilaudid [Hydromorphone] Itching   Codeine Hives   Retin-A [Tretinoin] Other (See Comments)    Pt reports burning   Tape Rash    ROS Review of Systems  Constitutional:  Negative for chills and fever.  HENT: Negative.    Eyes: Negative.   Respiratory:  Negative for shortness of breath.   Cardiovascular:  Negative for chest pain.  Gastrointestinal:  Negative.   Endocrine: Negative.   Genitourinary: Negative.   Musculoskeletal: Negative.   Skin:  Positive for color change.  Allergic/Immunologic: Negative.   Neurological: Negative.   Hematological: Negative.   Psychiatric/Behavioral: Negative.       Objective:    Physical Exam Vitals and nursing note reviewed.  Constitutional:      Appearance: Normal appearance.  HENT:     Head: Normocephalic and atraumatic.     Right Ear: External ear normal.     Left Ear: External ear normal.     Nose: Nose normal.     Mouth/Throat:     Mouth: Mucous membranes are moist.     Pharynx: Oropharynx is clear.  Eyes:     Extraocular Movements: Extraocular movements intact.     Conjunctiva/sclera: Conjunctivae normal.     Pupils: Pupils are equal, round, and reactive to light.  Cardiovascular:     Rate and Rhythm: Normal rate and regular rhythm.     Pulses: Normal pulses.     Heart sounds: Normal heart sounds.  Pulmonary:     Effort: Pulmonary effort is normal.     Breath sounds: Normal breath sounds.  Musculoskeletal:        General: Normal range of motion.     Cervical back: Normal range of motion and neck supple.  Skin:    General: Skin is warm and dry.     Comments: See photo  Neurological:     General: No focal deficit present.     Mental Status: She is alert and oriented to person, place, and time.  Psychiatric:        Mood and Affect: Mood normal.        Behavior: Behavior normal.        Thought Content: Thought content normal.        Judgment: Judgment normal.   Verbal consent given by patient to have photo included in chart   BP 134/82 (BP Location: Left Arm, Patient Position: Sitting, Cuff Size: Normal)    Pulse 95    Resp 18    Ht $R'5\' 8"'Rl$  (1.727 m)    Wt 247 lb (112 kg)    SpO2 98%    BMI 37.56 kg/m  Wt Readings from Last 3 Encounters:  04/23/21 247 lb (112 kg)  01/29/21 247 lb 6.4 oz (112.2 kg)  01/24/21 243 lb 8 oz (110.5 kg)     Health Maintenance Due  Topic  Date Due   Hepatitis C Screening  Never done   COVID-19 Vaccine (3 - [employer] risk series) 06/20/2019    There are no preventive care reminders to display for this patient.  Lab Results  Component Value Date   TSH 3.050 05/03/2019   Lab Results  Component Value Date  WBC 10.2 06/18/2020   HGB 13.3 06/18/2020   HCT 42.0 06/18/2020   MCV 83 06/18/2020   PLT 199 06/18/2020   Lab Results  Component Value Date   NA 140 11/14/2019   K 4.1 11/14/2019   CO2 23 11/14/2019   GLUCOSE 123 (H) 11/14/2019   BUN 6 11/14/2019   CREATININE 0.84 11/14/2019   BILITOT 0.5 01/10/2017   ALKPHOS 50 01/10/2017   AST 42 (H) 01/10/2017   ALT 49 01/10/2017   PROT 6.7 01/10/2017   ALBUMIN 3.9 01/10/2017   CALCIUM 9.7 11/14/2019   ANIONGAP 12 11/14/2019   No results found for: CHOL No results found for: HDL No results found for: LDLCALC No results found for: TRIG No results found for: CHOLHDL Lab Results  Component Value Date   HGBA1C 6.2 (H) 06/18/2020      Assessment & Plan:   Problem List Items Addressed This Visit       Other   Prediabetes   Relevant Medications   Blood Glucose Monitoring Suppl (ACCU-CHEK AVIVA PLUS) w/Device KIT   glucose blood (ACCU-CHEK AVIVA PLUS) test strip   Accu-Chek Softclix Lancets lancets   Other Visit Diagnoses     Boil, thigh    -  Primary   Relevant Medications   sulfamethoxazole-trimethoprim (BACTRIM DS) 800-160 MG tablet   fluconazole (DIFLUCAN) 150 MG tablet   Antibiotic-induced yeast infection       Relevant Medications   sulfamethoxazole-trimethoprim (BACTRIM DS) 800-160 MG tablet   fluconazole (DIFLUCAN) 150 MG tablet       Meds ordered this encounter  Medications   sulfamethoxazole-trimethoprim (BACTRIM DS) 800-160 MG tablet    Sig: Take 1 tablet by mouth 2 (two) times daily for 5 days.    Dispense:  10 tablet    Refill:  0    Order Specific Question:   Supervising Provider    Answer:   Elsie Stain [[id number]]   fluconazole  (DIFLUCAN) 150 MG tablet    Sig: Take 1 tablet (150 mg total) by mouth once for 1 dose.    Dispense:  1 tablet    Refill:  0    Order Specific Question:   Supervising Provider    Answer:   Elsie Stain [[id number]]   Blood Glucose Monitoring Suppl (ACCU-CHEK AVIVA PLUS) w/Device KIT    Sig: 1 Units by Does not apply route once for 1 dose.    Dispense:  1 kit    Refill:  0    Order Specific Question:   Supervising Provider    Answer:   Joya Gaskins, PATRICK E [[id number]]   glucose blood (ACCU-CHEK AVIVA PLUS) test strip    Sig: Use as instructed    Dispense:  100 each    Refill:  12    Order Specific Question:   Supervising Provider    Answer:   Asencion Noble E [[id number]]   Accu-Chek Softclix Lancets lancets    Sig: Use as instructed    Dispense:  100 each    Refill:  12    Order Specific Question:   Supervising Provider    Answer:   Asencion Noble E [[id number]]  1. Boil, thigh Trial Bactrim, patient education given on supportive care, red flags for prompt reevaluation  Patient to return to mobile unit in 1 week for follow-up and review of prediabetes.  Patient is concerned of elevated blood glucose readings.  - sulfamethoxazole-trimethoprim (BACTRIM DS) 800-160 MG tablet; Take 1 tablet by mouth 2 (two) times  daily for 5 days.  Dispense: 10 tablet; Refill: 0  2. Antibiotic-induced yeast infection Trial Diflucan - fluconazole (DIFLUCAN) 150 MG tablet; Take 1 tablet (150 mg total) by mouth once for 1 dose.  Dispense: 1 tablet; Refill: 0  3. Prediabetes Patient requested prescription for blood glucose monitor. - Blood Glucose Monitoring Suppl (ACCU-CHEK AVIVA PLUS) w/Device KIT; 1 Units by Does not apply route once for 1 dose.  Dispense: 1 kit; Refill: 0 - glucose blood (ACCU-CHEK AVIVA PLUS) test strip; Use as instructed  Dispense: 100 each; Refill: 12 - Accu-Chek Softclix Lancets lancets; Use as instructed  Dispense: 100 each; Refill: 12   I have reviewed the patient's medical history (PMH,  PSH, Social History, Family History, Medications, and allergies) , and have been updated if relevant. I spent 20 minutes reviewing chart and  face to face time with patient.   Follow-up: Return in about 1 week (around 04/30/2021).    Loraine Grip Mayers, PA-C

## 2021-04-23 NOTE — Progress Notes (Signed)
Pt has not eaten today an dhas had drink. Pt reports 2/10 pain in upper thigh ? ?

## 2021-04-30 ENCOUNTER — Other Ambulatory Visit: Payer: Self-pay

## 2021-04-30 ENCOUNTER — Encounter: Payer: Self-pay | Admitting: Physician Assistant

## 2021-04-30 ENCOUNTER — Other Ambulatory Visit: Payer: Self-pay | Admitting: Physician Assistant

## 2021-04-30 ENCOUNTER — Ambulatory Visit: Payer: Self-pay | Admitting: Physician Assistant

## 2021-04-30 VITALS — BP 124/85 | HR 64 | Temp 98.6°F | Resp 18 | Ht 68.0 in | Wt 243.0 lb

## 2021-04-30 DIAGNOSIS — Z13220 Encounter for screening for lipoid disorders: Secondary | ICD-10-CM

## 2021-04-30 DIAGNOSIS — Z1159 Encounter for screening for other viral diseases: Secondary | ICD-10-CM

## 2021-04-30 DIAGNOSIS — Z6837 Body mass index (BMI) 37.0-37.9, adult: Secondary | ICD-10-CM

## 2021-04-30 DIAGNOSIS — R7303 Prediabetes: Secondary | ICD-10-CM

## 2021-04-30 DIAGNOSIS — I10 Essential (primary) hypertension: Secondary | ICD-10-CM

## 2021-04-30 DIAGNOSIS — L02229 Furuncle of trunk, unspecified: Secondary | ICD-10-CM

## 2021-04-30 LAB — POCT GLYCOSYLATED HEMOGLOBIN (HGB A1C): Hemoglobin A1C: 6 % — AB (ref 4.0–5.6)

## 2021-04-30 MED ORDER — MUPIROCIN CALCIUM 2 % EX CREA: 1.0000 "application " | TOPICAL_CREAM | Freq: Two times a day (BID) | CUTANEOUS | 0 refills | Status: DC

## 2021-04-30 MED ORDER — MUPIROCIN 2 % EX OINT: 1.0000 "application " | TOPICAL_OINTMENT | Freq: Two times a day (BID) | CUTANEOUS | 0 refills | Status: AC

## 2021-04-30 NOTE — Patient Instructions (Signed)
I strongly encourage you to eliminate [facility], switch to [facility] Zero sugar.  You will notice a huge change when you do. ? ?I sent a prescription of Bactroban to help with the bump under your right breast. ? ?We will call you with today's lab results ? ?Kennieth Rad, PA-C ?Physician Assistant ?[place] ?[email or web address] ? ? ?Prediabetes Eating Plan ?Prediabetes is a condition that causes blood sugar (glucose) levels to be higher than normal. This increases the risk for developing type 2 diabetes (type 2 diabetes mellitus). Working with a health care provider or nutrition specialist (dietitian) to make diet and lifestyle changes can help prevent the onset of diabetes. These changes may help you: ?Control your blood glucose levels. ?Improve your cholesterol levels. ?Manage your blood pressure. ?What are tips for following this plan? ?Reading food labels ?Read food labels to check the amount of fat, salt (sodium), and sugar in prepackaged foods. Avoid foods that have: ?Saturated fats. ?Trans fats. ?Added sugars. ?Avoid foods that have more than 300 milligrams (mg) of sodium per serving. Limit your sodium intake to less than 2,300 mg each day. ?Shopping ?Avoid buying pre-made and processed foods. ?Avoid buying drinks with added sugar. ?Cooking ?Cook with olive oil. Do not use butter, lard, or ghee. ?Bake, broil, grill, steam, or boil foods. Avoid frying. ?Meal planning ? ?Work with your dietitian to create an eating plan that is right for you. This may include tracking how many calories you take in each day. Use a food diary, notebook, or mobile application to track what you eat at each meal. ?Consider following a Mediterranean diet. This includes: ?Eating several servings of fresh fruits and vegetables each day. ?Eating fish at least twice a week. ?Eating one serving each day of whole grains, beans, nuts, and seeds. ?Using olive oil instead of  other fats. ?Limiting alcohol. ?Limiting red meat. ?Using nonfat or low-fat dairy products. ?Consider following a plant-based diet. This includes dietary choices that focus on eating mostly vegetables and fruit, grains, beans, nuts, and seeds. ?If you have high blood pressure, you may need to limit your sodium intake or follow a diet such as the DASH (Dietary Approaches to Stop Hypertension) eating plan. The DASH diet aims to lower high blood pressure. ?Lifestyle ?Set weight loss goals with help from your health care team. It is recommended that most people with prediabetes lose 7% of their body weight. ?Exercise for at least 30 minutes 5 or more days a week. ?Attend a support group or seek support from a mental health counselor. ?Take over-the-counter and prescription medicines only as told by your health care provider. ?What foods are recommended? ?Fruits ?Berries. Bananas. Apples. Oranges. Grapes. Papaya. Mango. Pomegranate. Kiwi. Grapefruit. Cherries. ?Vegetables ?Lettuce. Spinach. Peas. Beets. Cauliflower. Cabbage. Broccoli. Carrots. Tomatoes. Squash. Eggplant. Herbs. Peppers. Onions. Cucumbers. Brussels sprouts. ?Grains ?Whole grains, such as whole-wheat or whole-grain breads, crackers, cereals, and pasta. Unsweetened oatmeal. Bulgur. Barley. Quinoa. Brown rice. Corn or whole-wheat flour tortillas or taco shells. ?Meats and other proteins ?Seafood. Poultry without skin. Lean cuts of pork and beef. Tofu. Eggs. Nuts. Beans. ?Dairy ?Low-fat or fat-free dairy products, such as yogurt, cottage cheese, and cheese. ?Beverages ?Water. Tea. Coffee. Sugar-free or diet soda. Seltzer water. Low-fat or nonfat milk. Milk alternatives, such as soy or almond milk. ?Fats and oils ?Olive oil. Canola oil. Sunflower oil. Grapeseed oil. Avocado. Walnuts. ?Sweets and desserts ?Sugar-free or low-fat pudding. Sugar-free or low-fat ice cream and other frozen treats. ?Seasonings and condiments ?Herbs.  Sodium-free spices. Mustard.  Relish. Low-salt, low-sugar ketchup. Low-salt, low-sugar barbecue sauce. Low-fat or fat-free mayonnaise. ?The items listed above may not be a complete list of recommended foods and beverages. Contact a dietitian for more information. ?What foods are not recommended? ?Fruits ?Fruits canned with syrup. ?Vegetables ?Canned vegetables. Frozen vegetables with butter or cream sauce. ?Grains ?Refined white flour and flour products, such as bread, pasta, snack foods, and cereals. ?Meats and other proteins ?Fatty cuts of meat. Poultry with skin. Breaded or fried meat. Processed meats. ?Dairy ?Full-fat yogurt, cheese, or milk. ?Beverages ?Sweetened drinks, such as iced tea and soda. ?Fats and oils ?Butter. Lard. Ghee. ?Sweets and desserts ?Baked goods, such as cake, cupcakes, pastries, cookies, and cheesecake. ?Seasonings and condiments ?Spice mixes with added salt. Ketchup. Barbecue sauce. Mayonnaise. ?The items listed above may not be a complete list of foods and beverages that are not recommended. Contact a dietitian for more information. ?Where to find more information ?American Diabetes Association: www.diabetes.org ?Summary ?You may need to make diet and lifestyle changes to help prevent the onset of diabetes. These changes can help you control blood sugar, improve cholesterol levels, and manage blood pressure. ?Set weight loss goals with help from your health care team. It is recommended that most people with prediabetes lose 7% of their body weight. ?Consider following a Mediterranean diet. This includes eating plenty of fresh fruits and vegetables, whole grains, beans, nuts, seeds, fish, and low-fat dairy, and using olive oil instead of other fats. ?This information is not intended to replace advice given to you by your health care provider. Make sure you discuss any questions you have with your health care provider. ?Document Revised: 05/05/2019 Document Reviewed: 05/05/2019 ?Elsevier Patient Education ? [place]. ? ?

## 2021-04-30 NOTE — Progress Notes (Signed)
? ?Established Patient Office Visit ? ?Subjective:  ?Patient ID: Heidi Bartlett, female    DOB: [birth year]  Age: 41 y.o. MRN: [id number] ? ?CC:  ?Chief Complaint  ?Patient presents with  ? Annual Exam  ? ? ?HPI ?Heidi Bartlett states that she has not been able to check her blood glucose levels at home due to not being able to pick up glucometer yet due to insurance issue. ? ?States that the boil on her upper thigh has mostly resolved, states that there is still a knot present.  States that she did notice a small bump that is slightly tender to touch, up right underneath her right breast.  Has not tried anything for relief. ? ?States that she does overeat her portions, does not eat many fruits or vegetables, states that she does not like drinking water, states drink of choice is [place].  Does desire to work on her lifestyle. ? ?Past Medical History:  ?Diagnosis Date  ? Abnormal uterine bleeding (AUB)   ? Anxiety   ? Pre-diabetes   ? ? ?Past Surgical History:  ?Procedure Laterality Date  ? CESAREAN SECTION  2004  ? CHOLECYSTECTOMY  2004  ? LAPAROSCOPIC UNILATERAL SALPINGO OOPHERECTOMY Right 11/15/2019  ? Procedure: LAPAROSCOPIC RIGHT SALPINGO OOPHORECTOMY AND RIGHT OVARIAN CYSTECTOMY;  Surgeon: Woodroe Mode, MD;  Location: [place];  Service: Gynecology;  Laterality: Right;  ? ? ?Family History  ?Problem Relation Age of Onset  ? Breast cancer Mother   ? ? ?Social History  ? ?Socioeconomic History  ? Marital status: Married  ?  Spouse name: Not on file  ? Number of children: 0  ? Years of education: 9th grade  ? Highest education level: Not on file  ?Occupational History  ? Occupation: unemployed  ?Tobacco Use  ? Smoking status: Every Day  ?  Packs/day: 0.50  ?  Types: Cigarettes  ? Smokeless tobacco: Never  ?Vaping Use  ? Vaping Use: Never used  ?Substance and Sexual Activity  ? Alcohol use: Not Currently  ? Drug use: Not Currently  ? Sexual activity: Yes  ?  Birth  control/protection: None  ?Other Topics Concern  ? Not on file  ?Social History Narrative  ? Not on file  ? ?Social Determinants of Health  ? ?Financial Resource Strain: Not on file  ?Food Insecurity: Food Insecurity Present  ? Worried About [place] in the Last Year: Sometimes true  ? Ran Out of Food in the Last Year: Never true  ?Transportation Needs: No Transportation Needs  ? Lack of Transportation (Medical): No  ? Lack of Transportation (Non-Medical): No  ?Physical Activity: Not on file  ?Stress: Not on file  ?Social Connections: Not on file  ?Intimate Partner Violence: Not on file  ? ? ?Outpatient Medications Prior to Visit  ?Medication Sig Dispense Refill  ? Accu-Chek Softclix Lancets lancets Use as instructed 100 each 12  ? cyclobenzaprine (FLEXERIL) 10 MG tablet TAKE 1 TABLET BY MOUTH AT BEDTIME 30 tablet 2  ? gabapentin (NEURONTIN) 600 MG tablet Take 1 tablet (600 mg total) by mouth at bedtime. 30 tablet 2  ? glucose blood (ACCU-CHEK AVIVA PLUS) test strip Use as instructed 100 each 12  ? hydrOXYzine (VISTARIL) 25 MG capsule TAKE 1 CAPSULE BY MOUTH TWICE DAILY AS NEEDED FOR ANXIETY 30 capsule 0  ? medroxyPROGESTERone (PROVERA) 10 MG tablet Take one by mouth daily until 01/31/20, then take one a day on day 1-14 each month starting  02/18/2020 60 tablet 10  ? metFORMIN (GLUCOPHAGE) 500 MG tablet Take 1 tablet (500 mg total) by mouth daily with breakfast. 30 tablet 11  ? nystatin (MYCOSTATIN/NYSTOP) powder Apply 1 application topically 2 (two) times daily. 15 g 0  ? spironolactone (ALDACTONE) 100 MG tablet Take 1 tablet (100 mg total) by mouth 2 (two) times daily. 60 tablet 11  ? ?No facility-administered medications prior to visit.  ? ? ?Allergies  ?Allergen Reactions  ? Dilaudid [Hydromorphone] Itching  ? Codeine Hives  ? Retin-A [Tretinoin] Other (See Comments)  ?  Pt reports burning  ? Tape Rash  ? ? ?ROS ?Review of Systems  ?Constitutional:  Negative for chills and fever.  ?HENT: Negative.     ?Eyes: Negative.   ?Respiratory:  Negative for shortness of breath.   ?Cardiovascular:  Negative for chest pain.  ?Gastrointestinal: Negative.   ?Endocrine: Negative.   ?Genitourinary: Negative.   ?Musculoskeletal: Negative.   ?Skin: Negative.   ?Allergic/Immunologic: Negative.   ?Neurological: Negative.   ?Hematological: Negative.   ?Psychiatric/Behavioral: Negative.    ? ?  ?Objective:  ?  ?Physical Exam ?Vitals and nursing note reviewed.  ?Constitutional:   ?   Appearance: Normal appearance.  ?HENT:  ?   Head: Normocephalic and atraumatic.  ?   Right Ear: External ear normal.  ?   Left Ear: External ear normal.  ?   Nose: Nose normal.  ?   Mouth/Throat:  ?   Mouth: Mucous membranes are moist.  ?   Pharynx: Oropharynx is clear.  ?Eyes:  ?   Extraocular Movements: Extraocular movements intact.  ?   Conjunctiva/sclera: Conjunctivae normal.  ?   Pupils: Pupils are equal, round, and reactive to light.  ?Cardiovascular:  ?   Rate and Rhythm: Normal rate and regular rhythm.  ?   Pulses: Normal pulses.  ?   Heart sounds: Normal heart sounds.  ?Pulmonary:  ?   Effort: Pulmonary effort is normal.  ?   Breath sounds: Normal breath sounds.  ?Musculoskeletal:     ?   General: Normal range of motion.  ?   Cervical back: Normal range of motion and neck supple.  ?Skin: ?   Findings: Rash is not pustular.  ? ?    ?   Comments: Small slightly tender to touch nonfluctuant, nonpurulent nodule noted under right breast at bra line.  ?Neurological:  ?   General: No focal deficit present.  ?   Mental Status: She is alert and oriented to person, place, and time.  ?Psychiatric:     ?   Mood and Affect: Mood normal.     ?   Behavior: Behavior normal.     ?   Thought Content: Thought content normal.     ?   Judgment: Judgment normal.  ? ? ?BP 124/85 (BP Location: Left Arm, Patient Position: Sitting, Cuff Size: Large)   Pulse 64   Temp 98.6 ?F (37 ?C) (Oral)   Resp 18   Ht '5\' 8"'$  (1.727 m)   Wt 243 lb (110.2 kg)   LMP 03/02/2021  (Approximate) Comment: Provera has not "jump started" cycle- 04/30/21  SpO2 99%   BMI 36.95 kg/m?  ?Wt Readings from Last 3 Encounters:  ?04/30/21 243 lb (110.2 kg)  ?04/23/21 247 lb (112 kg)  ?01/29/21 247 lb 6.4 oz (112.2 kg)  ? ? ? ?Health Maintenance Due  ?Topic Date Due  ? Hepatitis C Screening  Never done  ? COVID-19 Vaccine (3 - [employer]  risk series) 06/20/2019  ? ? ?There are no preventive care reminders to display for this patient. ? ?Lab Results  ?Component Value Date  ? TSH 3.050 05/03/2019  ? ?Lab Results  ?Component Value Date  ? WBC 10.2 06/18/2020  ? HGB 13.3 06/18/2020  ? HCT 42.0 06/18/2020  ? MCV 83 06/18/2020  ? PLT 199 06/18/2020  ? ?Lab Results  ?Component Value Date  ? NA 140 11/14/2019  ? K 4.1 11/14/2019  ? CO2 23 11/14/2019  ? GLUCOSE 123 (H) 11/14/2019  ? BUN 6 11/14/2019  ? CREATININE 0.84 11/14/2019  ? BILITOT 0.5 01/10/2017  ? ALKPHOS 50 01/10/2017  ? AST 42 (H) 01/10/2017  ? ALT 49 01/10/2017  ? PROT 6.7 01/10/2017  ? ALBUMIN 3.9 01/10/2017  ? CALCIUM 9.7 11/14/2019  ? ANIONGAP 12 11/14/2019  ? ?No results found for: CHOL ?No results found for: HDL ?No results found for: [place] ?No results found for: TRIG ?No results found for: CHOLHDL ?Lab Results  ?Component Value Date  ? HGBA1C 6.0 (A) 04/30/2021  ? ? ?  ?Assessment & Plan:  ? ?Problem List Items Addressed This Visit   ? ?  ? Cardiovascular and Mediastinum  ? Essential hypertension  ?  ? Other  ? Class 2 severe obesity due to excess calories with serious comorbidity and body mass index (BMI) of 37.0 to 37.9 in adult [facility])  ? Prediabetes - Primary  ? Relevant Orders  ? HgB A1c (Completed)  ? CBC with Differential/Platelet  ? Comp. Metabolic Panel (12)  ? TSH  ? Vitamin D, 25-hydroxy  ? ?Other Visit Diagnoses   ? ? Boil of trunk      ? Relevant Medications  ? mupirocin cream (BACTROBAN) 2 %  ? Screening, lipid      ? Relevant Orders  ? Lipid panel  ? Encounter for HCV screening test for low risk patient      ? Relevant Orders  ? HCV Ab  w Reflex to Quant PCR  ? ?  ? ? ?Meds ordered this encounter  ?Medications  ? mupirocin cream (BACTROBAN) 2 %  ?  Sig: Apply 1 application. topically 2 (two) times daily.  ?  Dispense:  15 g  ?  Refill:  0  ?  Order Specific Question

## 2021-04-30 NOTE — Progress Notes (Signed)
Patient has not eaten today. Patient has taken medication today. ?Patient denies pain at this time. ? ?

## 2021-04-30 NOTE — Addendum Note (Signed)
Addended by: Kennieth Rad on: 04/30/2021 05:08 PM ? ? Modules accepted: Orders ? ?

## 2021-05-02 ENCOUNTER — Other Ambulatory Visit: Payer: Self-pay | Admitting: Internal Medicine

## 2021-05-02 ENCOUNTER — Encounter: Payer: Self-pay | Admitting: Physician Assistant

## 2021-05-02 DIAGNOSIS — E782 Mixed hyperlipidemia: Secondary | ICD-10-CM | POA: Insufficient documentation

## 2021-05-02 LAB — COMPREHENSIVE METABOLIC PANEL
ALT: 92 IU/L — ABNORMAL HIGH (ref 0–32)
AST: 85 IU/L — ABNORMAL HIGH (ref 0–40)
Albumin/Globulin Ratio: 1.9 (ref 1.2–2.2)
Albumin: 4.8 g/dL (ref 3.8–4.8)
Alkaline Phosphatase: 67 IU/L (ref 44–121)
BUN/Creatinine Ratio: 12 (ref 9–23)
BUN: 9 mg/dL (ref 6–24)
Bilirubin Total: 0.6 mg/dL (ref 0.0–1.2)
CO2: 21 mmol/L (ref 20–29)
Calcium: 9.7 mg/dL (ref 8.7–10.2)
Chloride: 101 mmol/L (ref 96–106)
Creatinine, Ser: 0.73 mg/dL (ref 0.57–1.00)
Globulin, Total: 2.5 g/dL (ref 1.5–4.5)
Glucose: 89 mg/dL (ref 70–99)
Potassium: 4.4 mmol/L (ref 3.5–5.2)
Sodium: 138 mmol/L (ref 134–144)
Total Protein: 7.3 g/dL (ref 6.0–8.5)
eGFR: 107 mL/min/{1.73_m2} (ref >59)

## 2021-05-02 LAB — LIPID PANEL W/O CHOL/HDL RATIO
Cholesterol, Total: 236 mg/dL — ABNORMAL HIGH (ref 100–199)
HDL: 48 mg/dL (ref >39)
LDL Chol Calc (NIH): 159 mg/dL — ABNORMAL HIGH (ref 0–99)
Triglycerides: 159 mg/dL — ABNORMAL HIGH (ref 0–149)
VLDL Cholesterol Cal: 29 mg/dL (ref 5–40)

## 2021-05-02 LAB — CBC WITH DIFFERENTIAL/PLATELET
Basophils Absolute: 0.1 10*3/uL (ref 0.0–0.2)
Basos: 1 %
EOS (ABSOLUTE): 0.3 10*3/uL (ref 0.0–0.4)
Eos: 3 %
Hematocrit: 43.9 % (ref 34.0–46.6)
Hemoglobin: 14.8 g/dL (ref 11.1–15.9)
Immature Grans (Abs): 0 10*3/uL (ref 0.0–0.1)
Immature Granulocytes: 0 %
Lymphocytes Absolute: 3 10*3/uL (ref 0.7–3.1)
Lymphs: 32 %
MCH: 28.7 pg (ref 26.6–33.0)
MCHC: 33.7 g/dL (ref 31.5–35.7)
MCV: 85 fL (ref 79–97)
Monocytes Absolute: 0.7 10*3/uL (ref 0.1–0.9)
Monocytes: 8 %
Neutrophils Absolute: 5.2 10*3/uL (ref 1.4–7.0)
Neutrophils: 56 %
Platelets: 185 10*3/uL (ref 150–450)
RBC: 5.16 x10E6/uL (ref 3.77–5.28)
RDW: 12.6 % (ref 11.7–15.4)
WBC: 9.3 10*3/uL (ref 3.4–10.8)

## 2021-05-02 LAB — HCV RT-PCR, QUANT (NON-GRAPH)
HCV log10: 6.336 log10 IU/mL
Hepatitis C Quantitation: 2170000 IU/mL

## 2021-05-02 LAB — TSH: TSH: 2.02 u[IU]/mL (ref 0.450–4.500)

## 2021-05-02 LAB — VITAMIN D 25 HYDROXY (VIT D DEFICIENCY, FRACTURES): Vit D, 25-Hydroxy: 39.3 ng/mL (ref 30.0–100.0)

## 2021-05-02 LAB — HCV AB W REFLEX TO QUANT PCR: HCV Ab: REACTIVE — AB

## 2021-05-08 ENCOUNTER — Encounter: Payer: Self-pay | Admitting: Internal Medicine

## 2021-05-08 ENCOUNTER — Other Ambulatory Visit (HOSPITAL_COMMUNITY): Payer: Self-pay

## 2021-05-08 ENCOUNTER — Telehealth: Payer: Self-pay

## 2021-05-08 ENCOUNTER — Ambulatory Visit (INDEPENDENT_AMBULATORY_CARE_PROVIDER_SITE_OTHER): Payer: PRIVATE HEALTH INSURANCE | Admitting: Internal Medicine

## 2021-05-08 ENCOUNTER — Other Ambulatory Visit: Payer: Self-pay

## 2021-05-08 VITALS — BP 130/92 | HR 103 | Temp 98.4°F | Ht 68.0 in | Wt 249.0 lb

## 2021-05-08 DIAGNOSIS — B182 Chronic viral hepatitis C: Secondary | ICD-10-CM

## 2021-05-08 NOTE — Progress Notes (Signed)
[facility]  ?                                    [address], [place], [place], [address] ?                                              Phn. [phone]; Fax: [phone] ?                                                              Date:  ?Reason for Visit: Hepatitis C  ? ? ?HPI: Heidi Bartlett is a 41 y.o.old female with HCV. She was found to have HCV per on routine screening on 04/30/21, referred to ID. VL 2,170,000 on 04/30/21. 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 She moved to [facility] in 2017 for a fresh start. Pt has been incarcerated in the past.  She thinks she contracted HCV from sisiter(needle sharing). Pt thinks she was told she was HCV positive about 9 years ago.  ?Father passed from liver cirrhosis, etiology unclear.  ? ? ?Denies any hospitalizations related to liver disease, jaundice, ascites, GI bleeding, mental status changes, abdominal pain and acholic stool.  ? ?ROS: Denies yellowish discoloration of sclera and skin, abdominal pain/distension, hematemesis.  ?Denis cough, fever, chills, nightsweats, nausea, vomiting, diarrhea, constipation, weight loss, recent hospitalizations, rashes, joint complaints, shortness of breath, chest pain, headaches, dysuria . ? ? ?Current Outpatient Medications on File Prior to Visit  ?Medication Sig Dispense Refill  ? Accu-Chek Softclix Lancets lancets Use as instructed 100 each 12  ? cyclobenzaprine (FLEXERIL) 10 MG tablet TAKE 1 TABLET BY MOUTH AT BEDTIME 30 tablet 2  ? gabapentin (NEURONTIN) 600 MG tablet Take 1 tablet (600 mg total) by mouth at bedtime. 30 tablet 2  ? glucose blood (ACCU-CHEK AVIVA PLUS) test strip Use as instructed 100 each 12  ? hydrOXYzine (VISTARIL) 25 MG capsule TAKE 1 CAPSULE BY MOUTH TWICE DAILY AS NEEDED FOR ANXIETY 30 capsule 0  ? medroxyPROGESTERone (PROVERA) 10 MG tablet Take one by mouth daily until 01/31/20, then take one a day on  day 1-14 each month starting 02/18/2020 60 tablet 10  ? metFORMIN (GLUCOPHAGE) 500 MG tablet Take 1 tablet (500 mg total) by mouth daily with breakfast. 30 tablet 11  ? mupirocin ointment (BACTROBAN) 2 % Apply 1 application. topically 2 (two) times daily. 22 g 0  ? nystatin (MYCOSTATIN/NYSTOP) powder Apply 1 application topically 2 (two) times daily. 15 g 0  ? spironolactone (ALDACTONE) 100 MG tablet Take 1 tablet (100 mg total) by mouth 2 (two) times daily. 60 tablet 11  ? ?No current facility-administered medications on file prior to visit.  ? ?Allergies  ?Allergen Reactions  ? Dilaudid [Hydromorphone] Itching  ? Codeine Hives  ? Retin-A [Tretinoin] Other (See Comments)  ?  Pt reports burning  ? Tape Rash  ? ? ?Past Medical History:  ?Diagnosis Date  ? Abnormal uterine bleeding (AUB)   ? Anxiety   ? Pre-diabetes   ? ? ?Past Surgical  History:  ?Procedure Laterality Date  ? CESAREAN SECTION  2004  ? CHOLECYSTECTOMY  2004  ? LAPAROSCOPIC UNILATERAL SALPINGO OOPHERECTOMY Right 11/15/2019  ? Procedure: LAPAROSCOPIC RIGHT SALPINGO OOPHORECTOMY AND RIGHT OVARIAN CYSTECTOMY;  Surgeon: Woodroe Mode, MD;  Location: [facility];  Service: Gynecology;  Laterality: Right;  ? ? ?Social History  ? ?Socioeconomic History  ? Marital status: Married  ?  Spouse name: Not on file  ? Number of children: 0  ? Years of education: 9th grade  ? Highest education level: Not on file  ?Occupational History  ? Occupation: unemployed  ?Tobacco Use  ? Smoking status: Every Day  ?  Packs/day: 0.50  ?  Types: Cigarettes  ? Smokeless tobacco: Never  ?Vaping Use  ? Vaping Use: Never used  ?Substance and Sexual Activity  ? Alcohol use: Not Currently  ? Drug use: Not Currently  ? Sexual activity: Yes  ?  Birth control/protection: None  ?Other Topics Concern  ? Not on file  ?Social History Narrative  ? Not on file  ? ?Social Determinants of Health  ? ?Financial Resource Strain: Not on file  ?Food Insecurity: Food Insecurity Present  ?  Worried About Charity fundraiser in the Last Year: Sometimes true  ? Ran Out of Food in the Last Year: Never true  ?Transportation Needs: No Transportation Needs  ? Lack of Transportation (Medical): No  ? Lack of Transportation (Non-Medical): No  ?Physical Activity: Not on file  ?Stress: Not on file  ?Social Connections: Not on file  ?Intimate Partner Violence: Not on file  ? ? ?Family History  ?Problem Relation Age of Onset  ? Breast cancer Mother   ? ? ?Physical exam: ?There were no vitals taken for this visit. ? ?Gen: Alert and oriented x 3, no acute distress ?HEENT: [place]/AT, PERL, EOMI, no scleral icterus, no pale conjunctivae, hearing normal, oral mucosa moist ?Neck: Supple, no lymphadenopathy ?Cardio: Regular rate and rhythm; +S1 and S2; no murmurs, gallops, or rubs ?Resp: CTAB; no wheezes, rhonchi, or rales ?GI: Soft, nontender, nondistended, bowel sounds present ?GU: ?Musc: ?Extremities: No cyanosis, clubbing, or edema; +2 PT and DP pulses ?Skin: No rashes, lesions, or ecchymoses ?Neuro: No focal deficits ?Psych: Calm, cooperative ? ? ? ? ? ? ?Assessment/Plan: ?#Chronic Hepatitis C ?#Remote IVDA Hx(Injected heroin 10 years ago) ?Laboratory and imaging: ?CBC w diff ?CMP ?PT/PTT/INR ?Hep A and B serologies  ?HIV ?HCV genotype ?HCV RNA 2,170,000 on 04/30/21 ?Measure of fibrosis ?Abdominal ultrasound with elastography ?Prior treatment: none ?GT: unknown ?Evidence of cirrhosis:  ?Interested in treatment: ?Potential DDI: Mavyret and metformin. Due to improved glycemic control due to HCV treatment, may lead to  hypoglycemia. Monitor blood sugar  ?Follow-up with pharmacy in 2 weeks to start treatment and mid-treatment.  ?-End of treatment and cure visit with myself(8 weeks from starting antibiotics).  ?- [place] screening with [place] every 6 months ?- EGD to r/o varices in cirrhotics ?Smoking-yes(not interested in cessation)/Denies EtoH use. ? ?Counseling done on the following ?-Natural progression of hep c, transmission  (avoid sharing personal hygiene equipment), prevention, risks of left untreated and treatment options  ?-Avoid hepatotoxins like alcohol and excessive acetamaminphen (no more than 2 gram a day) ?-Avoid eating raw sea food ?-Risks of re-infection  ?-Hepatitis coinfection and vaccination( Pneumococcal vaccination in the cirrhotics  ? ? ?Electronically signed by: ? ?Laurice Record, MD ?Infectious Diseases  ?Fax no. [phone]  ?

## 2021-05-08 NOTE — Telephone Encounter (Signed)
[facility] Patient Advocate Encounter ? ?Insurance verification completed.   ? ?The patient is insured through [employer] . ? ?Medication will need a PA. ? ?We will continue to follow to see if copay assistance is needed. ? ?Ileene Patrick, CPhT ?Specialty Pharmacy Patient Advocate ?[facility] ?Phone: [phone] ?Fax:  [phone]  ?

## 2021-05-13 ENCOUNTER — Ambulatory Visit (HOSPITAL_COMMUNITY)
Admission: RE | Admit: 2021-05-13 | Discharge: 2021-05-13 | Disposition: A | Discharge status: Home / Self Care | Payer: PRIVATE HEALTH INSURANCE | Source: Ambulatory Visit | Attending: Internal Medicine | Admitting: Internal Medicine

## 2021-05-13 ENCOUNTER — Other Ambulatory Visit: Payer: Self-pay

## 2021-05-13 DIAGNOSIS — B182 Chronic viral hepatitis C: Secondary | ICD-10-CM | POA: Diagnosis not present

## 2021-05-13 LAB — LIVER FIBROSIS, FIBROTEST-ACTITEST
ALT: 60 U/L — ABNORMAL HIGH (ref 6–29)
Alpha-2-Macroglobulin: 309 mg/dL — ABNORMAL HIGH (ref 106–279)
Apolipoprotein A1: 158 mg/dL (ref 101–198)
Bilirubin: 0.4 mg/dL (ref 0.2–1.2)
Fibrosis Score: 0.3
GGT: 69 U/L — ABNORMAL HIGH (ref 3–55)
Haptoglobin: 116 mg/dL (ref 43–212)
Necroinflammat ACT Score: 0.36
Reference ID: 4292361

## 2021-05-13 LAB — CBC
HCT: 42.2 % (ref 35.0–45.0)
Hemoglobin: 14.2 g/dL (ref 11.7–15.5)
MCH: 29.7 pg (ref 27.0–33.0)
MCHC: 33.6 g/dL (ref 32.0–36.0)
MCV: 88.3 fL (ref 80.0–100.0)
MPV: 12.6 fL — ABNORMAL HIGH (ref 7.5–12.5)
Platelets: 150 10*3/uL (ref 140–400)
RBC: 4.78 10*6/uL (ref 3.80–5.10)
RDW: 12.5 % (ref 11.0–15.0)
WBC: 7 10*3/uL (ref 3.8–10.8)

## 2021-05-13 LAB — COMPREHENSIVE METABOLIC PANEL
AG Ratio: 1.5 (calc) (ref 1.0–2.5)
ALT: 63 U/L — ABNORMAL HIGH (ref 6–29)
AST: 45 U/L — ABNORMAL HIGH (ref 10–30)
Albumin: 4.3 g/dL (ref 3.6–5.1)
Alkaline phosphatase (APISO): 54 U/L (ref 31–125)
BUN: 11 mg/dL (ref 7–25)
CO2: 24 mmol/L (ref 20–32)
Calcium: 9.5 mg/dL (ref 8.6–10.2)
Chloride: 106 mmol/L (ref 98–110)
Creat: 0.73 mg/dL (ref 0.50–0.99)
Globulin: 2.8 g/dL (calc) (ref 1.9–3.7)
Glucose, Bld: 113 mg/dL — ABNORMAL HIGH (ref 65–99)
Potassium: 4 mmol/L (ref 3.5–5.3)
Sodium: 138 mmol/L (ref 135–146)
Total Bilirubin: 0.4 mg/dL (ref 0.2–1.2)
Total Protein: 7.1 g/dL (ref 6.1–8.1)

## 2021-05-13 LAB — HIV ANTIBODY (ROUTINE TESTING W REFLEX): HIV 1&2 Ab, 4th Generation: NONREACTIVE

## 2021-05-13 LAB — HEPATITIS C GENOTYPE

## 2021-05-13 LAB — PROTIME-INR
INR: 1
Prothrombin Time: 10.6 s (ref 9.0–11.5)

## 2021-05-13 LAB — HEPATITIS A ANTIBODY, TOTAL: Hepatitis A AB,Total: NONREACTIVE

## 2021-05-13 LAB — HEPATITIS C RNA QUANTITATIVE
HCV Quantitative Log: 6.46 log IU/mL — ABNORMAL HIGH
HCV RNA, PCR, QN: 2890000 IU/mL — ABNORMAL HIGH

## 2021-05-13 LAB — HEPATITIS B SURFACE ANTIGEN: Hepatitis B Surface Ag: NONREACTIVE

## 2021-05-13 LAB — HEPATITIS B SURFACE ANTIBODY,QUALITATIVE: Hep B S Ab: NONREACTIVE

## 2021-05-20 ENCOUNTER — Other Ambulatory Visit: Payer: Self-pay | Admitting: Internal Medicine

## 2021-05-21 NOTE — Telephone Encounter (Signed)
Requested Prescriptions  ?Pending Prescriptions Disp Refills  ?? hydrOXYzine (VISTARIL) 25 MG capsule [Pharmacy Med Name: hydrOXYzine Pamoate 25 MG Oral Capsule] 30 capsule 0  ?  Sig: TAKE 1 CAPSULE BY MOUTH TWICE DAILY AS NEEDED FOR ANXIETY  ?  ? Ear, Nose, and Throat:  Antihistamines 2 Passed - 05/20/2021  8:47 AM  ?  ?  Passed - Cr in normal range and within 360 days  ?  Creat  ?Date Value Ref Range Status  ?05/08/2021 0.73 0.50 - 0.99 mg/dL Final  ?   ?  ?  Passed - Valid encounter within last 12 months  ?  Recent Outpatient Visits   ?      ? 3 months ago Obesity (BMI 35.0-39.9 without comorbidity)  ? [place] Karle Plumber B, MD  ? 7 months ago Abscess of groin, left  ? [place] Ladell Pier, MD  ? 8 months ago Essential hypertension  ? [place] Karle Plumber B, MD  ? 9 months ago Chronic bilateral low back pain with bilateral sciatica  ? [place], Vernia Buff, NP  ? 1 year ago Vaginal candidiasis  ? [place] Gildardo Pounds, NP  ?  ?  ?Future Appointments   ?        ? In 1 week Ladell Pier, MD [place]  ?  ? ?  ?  ?  ? ?

## 2021-05-22 ENCOUNTER — Telehealth: Payer: Self-pay

## 2021-05-22 ENCOUNTER — Other Ambulatory Visit (HOSPITAL_COMMUNITY): Payer: Self-pay

## 2021-05-22 NOTE — Telephone Encounter (Signed)
[facility] Patient Advocate Encounter ?  ?[place] from [facility] [place] that prior authorization for [place] is required. ?  ?PA submitted on 05/22/21 ?Key [id number] ?Status is pending ?   ?[facility] will continue to follow. ? ? ?Ileene Patrick, CPhT ?Specialty Pharmacy Patient Advocate ?[facility] ?Phone: [phone] ?Fax:  [phone]  ?

## 2021-05-27 ENCOUNTER — Other Ambulatory Visit (HOSPITAL_COMMUNITY): Payer: Self-pay

## 2021-05-27 ENCOUNTER — Other Ambulatory Visit: Payer: Self-pay | Admitting: Pharmacist

## 2021-05-27 NOTE — Progress Notes (Signed)
PA for Mavyret was denied. Will approval Harvoni or Epclusa. Will have Butch Penny start PA for Epclusa x 12 weeks. No drug interaction noted. ? ?Aren Pryde L. Blanca Carreon, PharmD ?[facility] Clinical Pharmacist Practitioner ? ?

## 2021-05-28 ENCOUNTER — Other Ambulatory Visit (HOSPITAL_COMMUNITY): Payer: Self-pay

## 2021-05-28 ENCOUNTER — Telehealth: Payer: Self-pay

## 2021-05-28 NOTE — Telephone Encounter (Signed)
[facility] Patient Advocate Encounter ? ?Prior Authorization for [employer] (generic) has been approved.   ? ? ?Effective dates: 05/28/21 through 08/26/21 ? ?Patients co-pay is $7201.00.  ? ?Prescription can be sent to [facility]. ? ?I was able to get a copay card but I have to call [place]. ? ?I will call the patient to get her income information to apply for [employer] ? ? ? ? ?[facility] will continue to follow. ? ?Ileene Patrick, CPhT ?Specialty Pharmacy Patient Advocate ?[facility] ?Phone: [phone] ?Fax:  [phone]  ?

## 2021-05-29 ENCOUNTER — Other Ambulatory Visit: Payer: Self-pay | Admitting: Pharmacist

## 2021-05-29 ENCOUNTER — Other Ambulatory Visit (HOSPITAL_COMMUNITY): Payer: Self-pay

## 2021-05-29 DIAGNOSIS — B182 Chronic viral hepatitis C: Secondary | ICD-10-CM

## 2021-05-29 MED ORDER — SOFOSBUVIR-VELPATASVIR 400-100 MG PO TABS
1.0000 | ORAL_TABLET | Freq: Every day | ORAL | 2 refills | Status: DC
  Filled 2021-05-29 – 2021-06-03 (×3): qty 28, 28d supply, fill #0
  Filled 2021-06-21: qty 28, 28d supply, fill #1
  Filled 2021-07-19: qty 28, 28d supply, fill #2

## 2021-05-30 ENCOUNTER — Other Ambulatory Visit (HOSPITAL_COMMUNITY): Payer: Self-pay

## 2021-05-31 ENCOUNTER — Ambulatory Visit: Payer: PRIVATE HEALTH INSURANCE | Attending: Internal Medicine | Admitting: Internal Medicine

## 2021-05-31 ENCOUNTER — Other Ambulatory Visit (HOSPITAL_COMMUNITY): Payer: Self-pay

## 2021-05-31 ENCOUNTER — Encounter: Payer: Self-pay | Admitting: Internal Medicine

## 2021-05-31 VITALS — BP 126/67 | HR 97 | Resp 16 | Wt 248.0 lb

## 2021-05-31 DIAGNOSIS — R7303 Prediabetes: Secondary | ICD-10-CM

## 2021-05-31 DIAGNOSIS — F17200 Nicotine dependence, unspecified, uncomplicated: Secondary | ICD-10-CM

## 2021-05-31 DIAGNOSIS — I10 Essential (primary) hypertension: Secondary | ICD-10-CM

## 2021-05-31 DIAGNOSIS — Z6837 Body mass index (BMI) 37.0-37.9, adult: Secondary | ICD-10-CM

## 2021-05-31 DIAGNOSIS — B182 Chronic viral hepatitis C: Secondary | ICD-10-CM | POA: Insufficient documentation

## 2021-05-31 DIAGNOSIS — Z23 Encounter for immunization: Secondary | ICD-10-CM

## 2021-05-31 NOTE — Progress Notes (Signed)
? ? ?Patient ID: Heidi Bartlett, female    DOB: Oct 19, 1980  MRN: [id number] ? ?CC: Hypertension ? ? ?Subjective: ?Heidi Bartlett is a 41 y.o. female who presents for chronic ds management ?Her concerns today include:  ?History of HTN, obesity, tob dep, chronic right-sided lower back pain, DUB/PCOS, preDM. ? ?Dx with chronic hep C since last visit with me.  She was seen in the infectious disease specialist already and plan is to get her started on treatment.  Looks like they are waiting for approval for payment on Epclusa. ?-Test for hepatitis a and b reveals she is not  immunized ?-Her husband plans to be tested. ?-Patient states that she has already been instructed on precautions like avoiding sharing of items like toothbrushes and shavers. ? ?HTN:  taking Spirolactone as prescribed ?Tries to limit salt ?No chest pains or shortness of breath. ? ?PreDM/Obesity: A1c done last month was 6. ?Lab Results  ?Component Value Date  ? HGBA1C 6.0 (A) 04/30/2021  ?taking and tolerating low dose Metformin ?Gained 5 lbs in last 1 mth.  She feels wgh has increased even more ?Changed to drinking sugar free sodas and Monster drinks.  Likes pork; eats it about 3x/wk.  She has dec portion sizes since last visit.  She has been eating more fruits especially pineapples. ?Walk about 2 days a wk for 10 mins.   ?  ?Tobacco dependence: States that she still smokes but not as much.  Husband also smokes.  She is not ready to quit. ?Patient Active Problem List  ? Diagnosis Date Noted  ? Mixed hyperlipidemia 05/02/2021  ? Elevated liver enzymes 05/02/2021  ? Essential hypertension 09/14/2020  ? Prediabetes 01/03/2020  ? Obesity (BMI 30-39.9) 01/03/2020  ? Alopecia 01/03/2020  ? Right ovarian cyst 10/17/2019  ? Dysfunctional uterine bleeding 07/02/2018  ? Sciatica of right side 11/24/2017  ? Class 2 severe obesity due to excess calories with serious comorbidity and body mass index (BMI) of 37.0 to 37.9 in adult [facility]) 11/24/2017  ? Tobacco  dependence 11/24/2017  ?  ? ?Current Outpatient Medications on File Prior to Visit  ?Medication Sig Dispense Refill  ? Accu-Chek Softclix Lancets lancets Use as instructed (Patient not taking: Reported on 05/08/2021) 100 each 12  ? cyclobenzaprine (FLEXERIL) 10 MG tablet TAKE 1 TABLET BY MOUTH AT BEDTIME 30 tablet 2  ? gabapentin (NEURONTIN) 600 MG tablet Take 1 tablet (600 mg total) by mouth at bedtime. 30 tablet 2  ? glucose blood (ACCU-CHEK AVIVA PLUS) test strip Use as instructed (Patient not taking: Reported on 05/08/2021) 100 each 12  ? hydrOXYzine (VISTARIL) 25 MG capsule TAKE 1 CAPSULE BY MOUTH TWICE DAILY AS NEEDED FOR ANXIETY 30 capsule 0  ? medroxyPROGESTERone (PROVERA) 10 MG tablet Take one by mouth daily until 01/31/20, then take one a day on day 1-14 each month starting 02/18/2020 60 tablet 10  ? metFORMIN (GLUCOPHAGE) 500 MG tablet Take 1 tablet (500 mg total) by mouth daily with breakfast. 30 tablet 11  ? mupirocin ointment (BACTROBAN) 2 % Apply 1 application. topically 2 (two) times daily. 22 g 0  ? nystatin (MYCOSTATIN/NYSTOP) powder Apply 1 application topically 2 (two) times daily. 15 g 0  ? Sofosbuvir-Velpatasvir (EPCLUSA) 400-100 MG TABS Take 1 tablet by mouth daily. 28 tablet 2  ? spironolactone (ALDACTONE) 100 MG tablet Take 1 tablet (100 mg total) by mouth 2 (two) times daily. 60 tablet 11  ? ?No current facility-administered medications on file prior to visit.  ? ? ?  Allergies  ?Allergen Reactions  ? Dilaudid [Hydromorphone] Itching  ? Codeine Hives  ? Retin-A [Tretinoin] Other (See Comments)  ?  Pt reports burning  ? Tape Rash  ? ? ?Social History  ? ?Socioeconomic History  ? Marital status: Married  ?  Spouse name: Not on file  ? Number of children: 0  ? Years of education: 9th grade  ? Highest education level: Not on file  ?Occupational History  ? Occupation: unemployed  ?Tobacco Use  ? Smoking status: Every Day  ?  Packs/day: 0.50  ?  Types: Cigarettes  ? Smokeless tobacco: Never  ?Vaping  Use  ? Vaping Use: Never used  ?Substance and Sexual Activity  ? Alcohol use: Not Currently  ? Drug use: Not Currently  ? Sexual activity: Yes  ?  Birth control/protection: None  ?Other Topics Concern  ? Not on file  ?Social History Narrative  ? Not on file  ? ?Social Determinants of Health  ? ?Financial Resource Strain: Not on file  ?Food Insecurity: Food Insecurity Present  ? Worried About Charity fundraiser in the Last Year: Sometimes true  ? Ran Out of Food in the Last Year: Never true  ?Transportation Needs: No Transportation Needs  ? Lack of Transportation (Medical): No  ? Lack of Transportation (Non-Medical): No  ?Physical Activity: Not on file  ?Stress: Not on file  ?Social Connections: Not on file  ?Intimate Partner Violence: Not on file  ? ? ?Family History  ?Problem Relation Age of Onset  ? Breast cancer Mother   ? ? ?Past Surgical History:  ?Procedure Laterality Date  ? CESAREAN SECTION  2004  ? CHOLECYSTECTOMY  2004  ? LAPAROSCOPIC UNILATERAL SALPINGO OOPHERECTOMY Right 11/15/2019  ? Procedure: LAPAROSCOPIC RIGHT SALPINGO OOPHORECTOMY AND RIGHT OVARIAN CYSTECTOMY;  Surgeon: Woodroe Mode, MD;  Location: [facility];  Service: Gynecology;  Laterality: Right;  ? ? ?ROS: ?Review of Systems ?Negative except as stated above ? ?PHYSICAL EXAM: ?BP 126/67   Pulse 97   Resp 16   Wt 248 lb (112.5 kg)   SpO2 98%   BMI 37.71 kg/m?   ?Wt Readings from Last 3 Encounters:  ?05/31/21 248 lb (112.5 kg)  ?05/08/21 249 lb (112.9 kg)  ?04/30/21 243 lb (110.2 kg)  ? ? ?Physical Exam ? ?General appearance - alert, well appearing, middle-age Caucasian female and in no distress ?Mental status - normal mood, behavior, speech, dress, motor activity, and thought processes ?Neck - supple, no significant adenopathy ?Chest - clear to auscultation, no wheezes, rales or rhonchi, symmetric air entry ?Heart - normal rate, regular rhythm, normal S1, S2, no murmurs, rubs, clicks or gallops ?Extremities - peripheral  pulses normal, no pedal edema, no clubbing or cyanosis ? ? ? ?  Latest Ref Rng & Units 05/08/2021  ? 11:10 AM 04/30/2021  ? 11:08 AM 11/14/2019  ? 10:04 AM  ?CMP  ?Glucose 65 - 99 mg/dL 113   89   123    ?BUN 7 - 25 mg/dL '11   9   6    '$ ?Creatinine 0.50 - 0.99 mg/dL 0.73   0.73   0.84    ?Sodium 135 - 146 mmol/L 138   138   140    ?Potassium 3.5 - 5.3 mmol/L 4.0   4.4   4.1    ?Chloride 98 - 110 mmol/L 106   101   105    ?CO2 20 - 32 mmol/L 24   21  23    ?Calcium 8.6 - 10.2 mg/dL 9.5   9.7   9.7    ?Total Protein 6.1 - 8.1 g/dL 7.1   7.3     ?Total Bilirubin 0.2 - 1.2 mg/dL 0.4   0.6     ?Alkaline Phos 44 - 121 IU/L  67     ?AST 10 - 30 U/L 45   85     ?ALT 6 - 29 U/L 60    ? 63   92     ? ?Lipid Panel  ?   ?Component Value Date/Time  ? CHOL 236 (H) 04/30/2021 1108  ? TRIG 159 (H) 04/30/2021 1108  ? HDL 48 04/30/2021 1108  ? LDLCALC 159 (H) 04/30/2021 1108  ? ? ?CBC ?   ?Component Value Date/Time  ? WBC 7.0 05/08/2021 1110  ? RBC 4.78 05/08/2021 1110  ? HGB 14.2 05/08/2021 1110  ? HGB 14.8 04/30/2021 1108  ? HCT 42.2 05/08/2021 1110  ? HCT 43.9 04/30/2021 1108  ? PLT 150 05/08/2021 1110  ? PLT 185 04/30/2021 1108  ? MCV 88.3 05/08/2021 1110  ? MCV 85 04/30/2021 1108  ? MCH 29.7 05/08/2021 1110  ? MCHC 33.6 05/08/2021 1110  ? RDW 12.5 05/08/2021 1110  ? RDW 12.6 04/30/2021 1108  ? LYMPHSABS 3.0 04/30/2021 1108  ? MONOABS 0.5 01/10/2017 0929  ? EOSABS 0.3 04/30/2021 1108  ? BASOSABS 0.1 04/30/2021 1108  ? ? ?ASSESSMENT AND PLAN: ? ?1. Essential hypertension ?At goal.  Continue spironolactone.  Strongly encourage cutting back on salt in the foods. ? ?2. Class 2 severe obesity due to excess calories with serious comorbidity and body mass index (BMI) of 37.0 to 37.9 in adult [facility]) ?Encouraged her to eliminate sugary drinks from the diet.  Advise eating more of the fruits that have a lower glycemic index like apples, berries, strawberries and less pineapples.  Advised to incorporate fresh vegetables into her meals.  She now  has new insurance and is agreeable to referral to nutritionist.  Commended her on exercise.  However I have encouraged her to try to increase it to 4 times a week between now and her next visit.  Patient is agr

## 2021-06-03 ENCOUNTER — Ambulatory Visit: Payer: 59 | Admitting: Internal Medicine

## 2021-06-03 ENCOUNTER — Telehealth: Payer: Self-pay

## 2021-06-03 ENCOUNTER — Other Ambulatory Visit (HOSPITAL_COMMUNITY): Payer: Self-pay

## 2021-06-03 NOTE — Telephone Encounter (Signed)
[facility] Patient Advocate Encounter ? ?I was successful in securing patient a $30,000 grant from [employer] to provide copayment coverage for [place].  This will make the out of pocket expense $0.00.   ?  ?[employer] ID: [id number] ? ?I have spoken with the patient..   ?  ?The billing information is as follows and has been shared with [facility].  ?  ?RxBin: [id number] ?PCN: [id number] ?Member ID: [id number] ?Group ID: [id number] ?Dates of Eligibility: 05/04/21 through 05/04/22 ? ?Patient knows to call the office with questions or concerns. ? ?Ileene Patrick, CPhT ?Specialty Pharmacy Patient Advocate ?[facility] for Infect[facility] ?Phone: [phone] ?Fax:  [phone]  ?

## 2021-06-04 ENCOUNTER — Telehealth: Payer: Self-pay

## 2021-06-05 ENCOUNTER — Other Ambulatory Visit: Payer: Self-pay | Admitting: Internal Medicine

## 2021-06-06 ENCOUNTER — Telehealth: Payer: Self-pay | Admitting: Student-PharmD

## 2021-06-06 ENCOUNTER — Ambulatory Visit: Payer: Self-pay | Admitting: *Deleted

## 2021-06-06 NOTE — Telephone Encounter (Signed)
Patient called asking for medication recommendation for headaches/migraines while taking Epclusa. Says she typically take Excedrin. Recommended patient to avoid acetaminophen if possible and that she can take OTC ibuprofen. Counseled patient if she takes acetaminophen to limit to max 2 grams daily. Told patient to call us if headaches do not improve. Patient verbalized understanding. ? ?Marlowe Alt, PharmD Candidate ?06/06/2021 11:22 AM ? ?

## 2021-06-06 NOTE — Telephone Encounter (Signed)
Reason for Disposition ? [1] MODERATE headache (e.g., interferes with normal activities) AND [2] present > 24 hours AND [3] unexplained  (Exceptions: analgesics not tried, typical migraine, or headache part of viral illness) ?   Started on Epclusa and has had a headache for several days.  Want can she take that is compatible with her medications? ? ?Answer Assessment - Initial Assessment Questions ?1. LOCATION: "Where does it hurt?"  ?    Headache for several days.   I've had headaches all my life.   I started a new medication for Hepatitis C.   I'm to let them know when I start a new medication.  I've been started on Epclusa for the hepatitis so I want to know what I can take for the headache that is compatible. ? ?I have referred her to the pharmacist or provider that is prescribing the Epclusa.   Pt said she has sent them a message too but thought she would check with [place] too.   Also to let Dr. Wynetta Emery know she is on this medication now. ?2. ONSET: "When did the headache start?" (Minutes, hours or days)  ?    Several days ago ?3. PATTERN: "Does the pain come and go, or has it been constant since it started?" ?    Constant ?4. SEVERITY: "How bad is the pain?" and "What does it keep you from doing?"  (e.g., Scale 1-10; mild, moderate, or severe) ?  - MILD (1-3): doesn't interfere with normal activities  ?  - MODERATE (4-7): interferes with normal activities or awakens from sleep  ?  - SEVERE (8-10): excruciating pain, unable to do any normal activities    ?    Not asked ?5. RECURRENT SYMPTOM: "Have you ever had headaches before?" If Yes, ask: "When was the last time?" and "What happened that time?"  ?    Yes  I've dealt with headaches all my life. ?6. CAUSE: "What do you think is causing the headache?" ?    Possibly the Epclusa medication I recently started for hepatitis ?7. MIGRAINE: "Have you been diagnosed with migraine headaches?" If Yes, ask: "Is this headache similar?"  ?    No just have had headaches all my  life ?8. HEAD INJURY: "Has there been any recent injury to the head?"  ?    Not asked ?9. OTHER SYMPTOMS: "Do you have any other symptoms?" (fever, stiff neck, eye pain, sore throat, cold symptoms) ?    Not asked ?10. PREGNANCY: "Is there any chance you are pregnant?" "When was your last menstrual period?" ?      Not asked ? ?Protocols used: Headache-A-AH ? ?Chief Complaint: Headache for several days.   Just started on Epclusa.  What can she take for a headache that is compatible with it? ?Symptoms: Headache for several days.   ?Frequency: Recently started Epclusa and headache is one of the side effects. ?Pertinent Negatives: Patient denies Taking any pain medication because didn't know what is compatible. ?Disposition: '[]'$ ED /'[]'$ Urgent Care (no appt availability in office) / '[]'$ Appointment(In office/virtual)/ '[]'$  [facility]/ '[]'$ Home Care/ '[]'$ Refused Recommended Disposition /'[]'$ [facility]/ '[x]'$  Follow-up with PCP ?Additional Notes: I have referred her to check with the pharmacist where she gets the Epclusa from or the provider that prescribes it for compatibilities and suggestions what to take for headache along with it.  Pt agreeable to this plan and has left the pharmacist a message.   She also wanted Dr. Wynetta Emery to know she  has been started on this drug.     ?

## 2021-06-06 NOTE — Telephone Encounter (Signed)
I returned pt's call.  She has had a migraine for several days.  Wants to know what she can take for it that is compatible with her medications. ? ? ?

## 2021-06-06 NOTE — Telephone Encounter (Signed)
She can take Aleve or Ibuprofen for headaches but needs to inform the prescriber of Epclusa about her headaches ?

## 2021-06-07 NOTE — Telephone Encounter (Signed)
Called pt unable to reach left VM  ?

## 2021-06-11 ENCOUNTER — Ambulatory Visit: Payer: 59 | Admitting: Internal Medicine

## 2021-06-14 NOTE — Telephone Encounter (Signed)
Left message on voicemail to return call.  ? May relay message from Dr. Margarita Rana.  ?

## 2021-06-19 ENCOUNTER — Other Ambulatory Visit (HOSPITAL_COMMUNITY): Payer: Self-pay

## 2021-06-21 ENCOUNTER — Telehealth: Payer: Self-pay | Admitting: Internal Medicine

## 2021-06-21 ENCOUNTER — Other Ambulatory Visit (HOSPITAL_COMMUNITY): Payer: Self-pay

## 2021-06-21 NOTE — Telephone Encounter (Signed)
Copied from [place] [phone]. Topic: General - Other ?>> Jun 21, 2021  1:58 PM Yvette Rack wrote: ?Reason for CRM: [facility] requests the ICD 10 for the patient. Cb# [phone] ?

## 2021-06-21 NOTE — Telephone Encounter (Signed)
Phone call returned to [facility].  They were needing the ICD codes for labs that were ordered on 04/30/2021 by PA Cari Mayers.  I was able to provide them with the ICD codes based on the diagnoses from her note. ?

## 2021-06-24 ENCOUNTER — Other Ambulatory Visit (HOSPITAL_COMMUNITY): Payer: Self-pay

## 2021-07-01 ENCOUNTER — Other Ambulatory Visit: Payer: Self-pay

## 2021-07-01 ENCOUNTER — Ambulatory Visit (INDEPENDENT_AMBULATORY_CARE_PROVIDER_SITE_OTHER): Payer: PRIVATE HEALTH INSURANCE | Admitting: Pharmacist

## 2021-07-01 ENCOUNTER — Ambulatory Visit: Payer: PRIVATE HEALTH INSURANCE | Attending: Internal Medicine | Admitting: Pharmacist

## 2021-07-01 DIAGNOSIS — B182 Chronic viral hepatitis C: Secondary | ICD-10-CM | POA: Diagnosis not present

## 2021-07-01 DIAGNOSIS — Z23 Encounter for immunization: Secondary | ICD-10-CM | POA: Diagnosis not present

## 2021-07-01 NOTE — Progress Notes (Signed)
Patient presents for vaccination against hepB per orders of Dr. Johnson. Consent given. Counseling provided. No contraindications exists. Vaccine administered without incident.   [name], PharmD, BCACP, CPP Clinical Pharmacist [facility] [phone]  

## 2021-07-01 NOTE — Progress Notes (Signed)
? ?07/01/2021 ? ?HPI: Heidi Bartlett is a 41 y.o. female who presents to the [facility] for Hepatitis C follow-up. ? ?Medication: Epclusa x 12 weeks ? ?Start Date: 06/03/21 ? ?Hepatitis C Genotype: 1a ? ?Fibrosis Score: F1 ? ?Hepatitis C RNA: 2.8 million on 05/08/21 ? ?Patient Active Problem List  ? Diagnosis Date Noted  ? Chronic hepatitis C without hepatic coma ([facility]) 05/31/2021  ? Mixed hyperlipidemia 05/02/2021  ? Elevated liver enzymes 05/02/2021  ? Essential hypertension 09/14/2020  ? Prediabetes 01/03/2020  ? Obesity (BMI 30-39.9) 01/03/2020  ? Alopecia 01/03/2020  ? Right ovarian cyst 10/17/2019  ? Dysfunctional uterine bleeding 07/02/2018  ? Sciatica of right side 11/24/2017  ? Class 2 severe obesity due to excess calories with serious comorbidity and body mass index (BMI) of 37.0 to 37.9 in adult [facility]) 11/24/2017  ? Tobacco dependence 11/24/2017  ? ? ?Patient's Medications  ?New Prescriptions  ? No medications on file  ?Previous Medications  ? ACCU-CHEK SOFTCLIX LANCETS LANCETS    Use as instructed  ? CYCLOBENZAPRINE (FLEXERIL) 10 MG TABLET    TAKE 1 TABLET BY MOUTH AT BEDTIME  ? GABAPENTIN (NEURONTIN) 600 MG TABLET    Take 1 tablet (600 mg total) by mouth at bedtime.  ? GLUCOSE BLOOD (ACCU-CHEK AVIVA PLUS) TEST STRIP    Use as instructed  ? HYDROXYZINE (VISTARIL) 25 MG CAPSULE    TAKE 1 CAPSULE BY MOUTH TWICE DAILY AS NEEDED FOR ANXIETY  ? MEDROXYPROGESTERONE (PROVERA) 10 MG TABLET    Take one by mouth daily until 01/31/20, then take one a day on day 1-14 each month starting 02/18/2020  ? METFORMIN (GLUCOPHAGE) 500 MG TABLET    Take 1 tablet (500 mg total) by mouth daily with breakfast.  ? MUPIROCIN OINTMENT (BACTROBAN) 2 %    Apply 1 application. topically 2 (two) times daily.  ? NYSTATIN (MYCOSTATIN/NYSTOP) POWDER    Apply 1 application topically 2 (two) times daily.  ? SOFOSBUVIR-VELPATASVIR (EPCLUSA) 400-100 MG TABS    Take 1 tablet by mouth daily.  ? SPIRONOLACTONE (ALDACTONE) 100 MG  TABLET    Take 1 tablet (100 mg total) by mouth 2 (two) times daily.  ?Modified Medications  ? No medications on file  ?Discontinued Medications  ? No medications on file  ? ? ?Allergies: ?Allergies  ?Allergen Reactions  ? Dilaudid [Hydromorphone] Itching  ? Codeine Hives  ? Retin-A [Tretinoin] Other (See Comments)  ?  Pt reports burning  ? Tape Rash  ? ? ?Past Medical History: ?Past Medical History:  ?Diagnosis Date  ? Abnormal uterine bleeding (AUB)   ? Anxiety   ? Pre-diabetes   ? ? ?Social History: ?Social History  ? ?Socioeconomic History  ? Marital status: Married  ?  Spouse name: Not on file  ? Number of children: 0  ? Years of education: 9th grade  ? Highest education level: Not on file  ?Occupational History  ? Occupation: unemployed  ?Tobacco Use  ? Smoking status: Every Day  ?  Packs/day: 0.50  ?  Types: Cigarettes  ? Smokeless tobacco: Never  ?Vaping Use  ? Vaping Use: Never used  ?Substance and Sexual Activity  ? Alcohol use: Not Currently  ? Drug use: Not Currently  ? Sexual activity: Yes  ?  Birth control/protection: None  ?Other Topics Concern  ? Not on file  ?Social History Narrative  ? Not on file  ? ?Social Determinants of Health  ? ?Financial Resource Strain: Not on file  ?  Food Insecurity: Food Insecurity Present  ? Worried About Charity fundraiser in the Last Year: Sometimes true  ? Ran Out of Food in the Last Year: Never true  ?Transportation Needs: No Transportation Needs  ? Lack of Transportation (Medical): No  ? Lack of Transportation (Non-Medical): No  ?Physical Activity: Not on file  ?Stress: Not on file  ?Social Connections: Not on file  ? ? ?Labs: ?Hepatitis C ?Lab Results  ?Component Value Date  ? HCVGENOTYPE 1a 05/08/2021  ? HCVRNAPCRQN 2,890,000 (H) 05/08/2021  ? FIBROSTAGE F1 05/08/2021  ? ?Hepatitis B ?Lab Results  ?Component Value Date  ? HEPBSAB NON-REACTIVE 05/08/2021  ? HEPBSAG NON-REACTIVE 05/08/2021  ? ?Hepatitis A ?Lab Results  ?Component Value Date  ? HAV NON-REACTIVE  05/08/2021  ? ?HIV ?Lab Results  ?Component Value Date  ? HIV NON-REACTIVE 05/08/2021  ? HIV Non Reactive 05/03/2019  ? ?Lab Results  ?Component Value Date  ? CREATININE 0.73 05/08/2021  ? CREATININE 0.73 04/30/2021  ? CREATININE 0.84 11/14/2019  ? CREATININE 0.74 01/10/2017  ? ?Lab Results  ?Component Value Date  ? AST 45 (H) 05/08/2021  ? AST 85 (H) 04/30/2021  ? AST 42 (H) 01/10/2017  ? ALT 60 (H) 05/08/2021  ? ALT 63 (H) 05/08/2021  ? ALT 92 (H) 04/30/2021  ? INR 1.0 05/08/2021  ? INR 0.96 12/29/2016  ? ? ?Assessment: ?Heidi Bartlett comes in today for her 4 week follow up after starting Epclusa for her chronic Hepatitis C infection. She started on 4/17 and has taken it every day without missing any doses. She has an alarm set on her phone every morning at 7:25am. She experienced mild nausea and cramping the first few weeks but it has since resolved. No vomiting or diarrhea. Some headaches but she suffers from migraines at baseline. She took her last pill of the 1st month today and will start her 2nd month tomorrow. Reminded her that she has one more refill after this next month and should end treatment around the middle of July. Congratulated her on her perfect adherence and encouraged continued compliance. Will repeat her HCV RNA and LFTs today and have her see Dr. Candiss Norse in 2 months for her end of treatment visit. ? ?Plan: ?- Continue Epclusa for 12 weeks ?- Hep C RNA and CMET today ?- F/up with Dr. Candiss Norse on 7/24 ? ?Brentley Horrell L. Daniele Dillow, PharmD, BCIDP, AAHIVP, CPP ?Clinical Pharmacist Practitioner ?Infectious Diseases Clinical Pharmacist ?[facility] ?07/01/2021, 2:03 PM ? ?

## 2021-07-03 ENCOUNTER — Other Ambulatory Visit: Payer: Self-pay | Admitting: Internal Medicine

## 2021-07-03 DIAGNOSIS — G8929 Other chronic pain: Secondary | ICD-10-CM

## 2021-07-04 ENCOUNTER — Encounter: Payer: Self-pay | Admitting: Obstetrics & Gynecology

## 2021-07-04 ENCOUNTER — Encounter: Payer: Self-pay | Admitting: Pharmacist

## 2021-07-04 ENCOUNTER — Telehealth: Payer: Self-pay | Admitting: Internal Medicine

## 2021-07-04 ENCOUNTER — Encounter: Payer: Self-pay | Admitting: Internal Medicine

## 2021-07-04 LAB — COMPREHENSIVE METABOLIC PANEL
AG Ratio: 1.6 (calc) (ref 1.0–2.5)
ALT: 34 U/L — ABNORMAL HIGH (ref 6–29)
AST: 25 U/L (ref 10–30)
Albumin: 4.4 g/dL (ref 3.6–5.1)
Alkaline phosphatase (APISO): 53 U/L (ref 31–125)
BUN: 10 mg/dL (ref 7–25)
CO2: 25 mmol/L (ref 20–32)
Calcium: 9.8 mg/dL (ref 8.6–10.2)
Chloride: 103 mmol/L (ref 98–110)
Creat: 0.9 mg/dL (ref 0.50–0.99)
Globulin: 2.8 g/dL (calc) (ref 1.9–3.7)
Glucose, Bld: 103 mg/dL — ABNORMAL HIGH (ref 65–99)
Potassium: 4 mmol/L (ref 3.5–5.3)
Sodium: 139 mmol/L (ref 135–146)
Total Bilirubin: 0.5 mg/dL (ref 0.2–1.2)
Total Protein: 7.2 g/dL (ref 6.1–8.1)

## 2021-07-04 LAB — HEPATITIS C RNA QUANTITATIVE
HCV Quantitative Log: <1.18 log IU/mL
HCV RNA, PCR, QN: <15 IU/mL

## 2021-07-04 NOTE — Telephone Encounter (Signed)
Disregard

## 2021-07-04 NOTE — Telephone Encounter (Signed)
MyChart message sent to patient.

## 2021-07-04 NOTE — Telephone Encounter (Signed)
PCP not prescriber of this medication.  There is a provider Kimberlee Nearing name written by Rx.   Pls advise

## 2021-07-04 NOTE — Telephone Encounter (Signed)
Will forward to provider  

## 2021-07-04 NOTE — Telephone Encounter (Signed)
Pt stated she needs refills for metFORMIN (GLUCOPHAGE) 500 MG tablet pt stated she only has two tablets left. However, pt has refills at the pharmacy, but the pharmacy won't fill them until June 13th.   Pt stated there were a couple of days she took more than one because she felt like it was not working. Pt stated her Woodroe Mode, MD advised her they were going to increase the dosage, and pt stated she thought he did.  Pt stated now Woodroe Mode, MD is telling her to contact her primary care that Woodroe Mode, MD was only prescribing this until pt could be seen by a primary doctor.    Pt is requesting a call back to discuss.

## 2021-07-19 ENCOUNTER — Other Ambulatory Visit (HOSPITAL_COMMUNITY): Payer: Self-pay

## 2021-07-22 ENCOUNTER — Encounter: Payer: Self-pay | Admitting: Internal Medicine

## 2021-07-25 ENCOUNTER — Other Ambulatory Visit (HOSPITAL_COMMUNITY): Payer: Self-pay

## 2021-07-26 ENCOUNTER — Encounter: Payer: Self-pay | Admitting: Nurse Practitioner

## 2021-07-26 ENCOUNTER — Ambulatory Visit: Payer: PRIVATE HEALTH INSURANCE | Attending: Nurse Practitioner | Admitting: Nurse Practitioner

## 2021-07-26 ENCOUNTER — Encounter: Payer: PRIVATE HEALTH INSURANCE | Attending: Internal Medicine | Admitting: Registered"

## 2021-07-26 VITALS — BP 116/81 | HR 97 | Ht 68.0 in | Wt 246.4 lb

## 2021-07-26 DIAGNOSIS — B379 Candidiasis, unspecified: Secondary | ICD-10-CM

## 2021-07-26 DIAGNOSIS — F32A Depression, unspecified: Secondary | ICD-10-CM

## 2021-07-26 DIAGNOSIS — T3695XA Adverse effect of unspecified systemic antibiotic, initial encounter: Secondary | ICD-10-CM

## 2021-07-26 DIAGNOSIS — R7303 Prediabetes: Secondary | ICD-10-CM | POA: Insufficient documentation

## 2021-07-26 DIAGNOSIS — F419 Anxiety disorder, unspecified: Secondary | ICD-10-CM | POA: Diagnosis not present

## 2021-07-26 DIAGNOSIS — L731 Pseudofolliculitis barbae: Secondary | ICD-10-CM

## 2021-07-26 MED ORDER — SULFAMETHOXAZOLE-TRIMETHOPRIM 800-160 MG PO TABS: 1.0000 | ORAL_TABLET | Freq: Two times a day (BID) | ORAL | 0 refills | Status: AC

## 2021-07-26 MED ORDER — FLUCONAZOLE 150 MG PO TABS: 150.0000 mg | ORAL_TABLET | Freq: Once | ORAL | 0 refills | Status: AC

## 2021-07-26 MED ORDER — BUSPIRONE HCL 15 MG PO TABS: 15.0000 mg | ORAL_TABLET | Freq: Two times a day (BID) | ORAL | 1 refills | Status: DC

## 2021-07-26 NOTE — Progress Notes (Signed)
Medical Nutrition Therapy  Appointment Start time:  [phone]  Appointment End time:  1020  Primary concerns today: pre-diabetes,   Referral diagnosis: R73.03 prediabetes Preferred learning style: no preference indicated Learning readiness: ready, change in progress  NUTRITION ASSESSMENT  Anthropometrics  Wt Readings from Last 3 Encounters:  07/26/21 245 lb 14.4 oz (111.5 kg)  05/31/21 248 lb (112.5 kg)  05/08/21 249 lb (112.9 kg)   Clinical Medical Hx: prediabetes Medications: DM - metformin, gabapentin Labs: A1c: 2 yro 5.9%, 41 yo 6.2%, 2 mo ago 6.0% Notable Signs/Symptoms: nail biting, reports hypoglycemic sxs  Lifestyle & Dietary Hx Pt states she moved here from [place] in 2017. Pt states she does not desire to go back because she had a bad drug problem and has been clean for several years and wants to stay that way.  Pt reports when taking metformin with hepatitis medication her feet get hot has tingling in feet and feels like blood sugar is low. Pt states has discussed with MD and was told that her hepatitis medication is causing low blood sugar. Pt states she now usually eats something with the medication.  Pt states she likes sweets and has had a hard time cutting back, also cutting back caffiene has been difficult but has reduced from 4-5 monster drinks to just 1 per day. Pt states she is drinking zero sugar soda, doesn't like it. Pt states she only drinks enough water to take medications.   Pt states she would like to check her blood sugar. Provided meter and instruction today and sent note to her PCP asking to change Rx so she can pick up more supplies. Accu-chek Guide Me Lot [id number] Exp: 2022-07-09 CBG: 118 mg/dL   Estimated daily fluid intake: negligible water oz Supplements: reveiwed Sleep: not adequate Stress / self-care: not assessed Current average weekly physical activity: not assessed  24-Hr Dietary Recall  (not assessed this visit) First Meal: (donut at 6 am  today) Snack:  Second Meal:  Snack:  Third Meal:  Snack:  Beverages:   NUTRITION DIAGNOSIS  NI-5.8.2 Excessive carbohydrate intake As related to history of sugar sweetened beverages.  As evidenced by Pt states she eats a lot of potatoes and bread.  NUTRITION INTERVENTION  Nutrition education (E-1) on the following topics:  Checking Blood sugar  Handouts Provided Include  ADA diabetes book Carb counting card MyPlate A1c chart Glucose log book  Learning Style & Readiness for Change Teaching method utilized: Visual & Auditory  Demonstrated degree of understanding via: Teach Back  Barriers to learning/adherence to lifestyle change: none  Goals Established by Pt Check blood sugar 2 times a day, 3 times per week, Return for follow-up visit and bring your log book Continue to cut back on sugar sweetened beverages Try to eat balanced meals and include beans Watch servings sizes of potatoes, rice, bread  MONITORING & EVALUATION Dietary intake, weekly physical activity, and blood sugar log in 2 months.

## 2021-07-26 NOTE — Patient Instructions (Addendum)
Check blood sugar 2 times a day, 3 times per week Return for follow-up visit and bring your log book  Continue to cut back on sugar sweetened beverages  Try to eat balanced meals and include beans  Watch servings sizes of potatoes, rice, bread

## 2021-07-26 NOTE — Progress Notes (Signed)
Heidi Bartlett was seen today for recurrent skin infections.  Diagnoses and all orders for this visit:  Pseudofolliculitis barbae -     sulfamethoxazole-trimethoprim (BACTRIM DS) 800-160 MG tablet; Take 1 tablet by mouth 2 (two) times daily for 7 days.  Antibiotic-induced yeast infection -     fluconazole (DIFLUCAN) 150 MG tablet; Take 1 tablet (150 mg total) by mouth once for 1 dose.  Anxiety and depression -     busPIRone (BUSPAR) 15 MG tablet; Take 1 tablet (15 mg total) by mouth 2 (two) times daily. For anxiety    Subjective:   Chief Complaint  Patient presents with   Recurrent Skin Infections   HPI Heidi Bartlett 41 y.o. female presents to office today for bump in her right axilla  She does shave her underarms. Last shaved a little over a week ago. She also noticed the bump around the same time after shaving. Initially it was tender to touch and painful but after using warm compresses it has decreased in size and is no longer painful. She denied any pain today with deep palpation of the area. At this time will treat as pseudofolliculitis barbae.  She will continue with warm compresses.  Since we are going into the weekend I did send in Bactrim to the pharmacy however she is aware not to pick this up unless the area increases in size, erythematous and is tender or painful to touch.  There is a photo of the area that she attached to her MyChart on 07/22/2021 and compared to today's photo that I am placing in her chart there is significant improvement.    Anxiety Currently taking hydroxyzine 25 mg twice daily as needed for anxiety.  She states it is not effective as she would like to try a different anxiolytic.  She does note that hydroxyzine helps with her pruritus.  At this time we will leave hydroxyzine on her profile for itching and will start her on BuSpar for anxiety. Aggravating factors: large crowds, going out in public/store, driving or riding in a car.     Review of  Systems  Constitutional:  Negative for fever, malaise/fatigue and weight loss.  HENT: Negative.  Negative for nosebleeds.   Eyes: Negative.  Negative for blurred vision, double vision and photophobia.  Respiratory: Negative.  Negative for cough and shortness of breath.   Cardiovascular: Negative.  Negative for chest pain, palpitations and leg swelling.  Gastrointestinal: Negative.  Negative for heartburn, nausea and vomiting.  Musculoskeletal: Negative.  Negative for myalgias.  Skin:        SEE HPI  Neurological: Negative.  Negative for dizziness, focal weakness, seizures and headaches.  Psychiatric/Behavioral:  Negative for suicidal ideas. The patient is nervous/anxious and has insomnia.     Past Medical History:  Diagnosis Date   Abnormal uterine bleeding (AUB)    Anxiety    Pre-diabetes     Past Surgical History:  Procedure Laterality Date   CESAREAN SECTION  2004   CHOLECYSTECTOMY  2004   LAPAROSCOPIC UNILATERAL SALPINGO OOPHERECTOMY Right 11/15/2019   Procedure: LAPAROSCOPIC RIGHT SALPINGO OOPHORECTOMY AND RIGHT OVARIAN CYSTECTOMY;  Surgeon: Woodroe Mode, MD;  Location: [facility];  Service: Gynecology;  Laterality: Right;    Family History  Problem Relation Age of Onset   Breast cancer Mother     Social History Reviewed with no changes to be made today.   Outpatient Medications Prior to Visit  Medication Sig Dispense Refill   cyclobenzaprine (FLEXERIL) 10  MG tablet TAKE 1 TABLET BY MOUTH AT BEDTIME 30 tablet 2   gabapentin (NEURONTIN) 600 MG tablet TAKE 1 TABLET BY MOUTH AT BEDTIME 30 tablet 4   hydrOXYzine (VISTARIL) 25 MG capsule TAKE 1 CAPSULE BY MOUTH TWICE DAILY AS NEEDED FOR ANXIETY 60 capsule 2   medroxyPROGESTERone (PROVERA) 10 MG tablet Take one by mouth daily until 01/31/20, then take one a day on day 1-14 each month starting 02/18/2020 60 tablet 10   metFORMIN (GLUCOPHAGE) 500 MG tablet Take 1 tablet (500 mg total) by mouth daily with  breakfast. 30 tablet 11   mupirocin ointment (BACTROBAN) 2 % Apply 1 application. topically 2 (two) times daily. 22 g 0   nystatin (MYCOSTATIN/NYSTOP) powder Apply 1 application topically 2 (two) times daily. 15 g 0   Sofosbuvir-Velpatasvir (EPCLUSA) 400-100 MG TABS Take 1 tablet by mouth daily. 28 tablet 2   spironolactone (ALDACTONE) 100 MG tablet Take 1 tablet (100 mg total) by mouth 2 (two) times daily. 60 tablet 11   Accu-Chek Softclix Lancets lancets Use as instructed (Patient not taking: Reported on 05/08/2021) 100 each 12   glucose blood (ACCU-CHEK AVIVA PLUS) test strip Use as instructed (Patient not taking: Reported on 05/08/2021) 100 each 12   No facility-administered medications prior to visit.    Allergies  Allergen Reactions   Dilaudid [Hydromorphone] Itching   Codeine Hives   Retin-A [Tretinoin] Other (See Comments)    Pt reports burning   Tape Rash       Objective:    BP 116/81   Pulse 97   Ht '5\' 8"'$  (1.727 m)   Wt 246 lb 6.4 oz (111.8 kg)   SpO2 100%   BMI 37.46 kg/m  Wt Readings from Last 3 Encounters:  07/26/21 246 lb 6.4 oz (111.8 kg)  07/26/21 245 lb 14.4 oz (111.5 kg)  05/31/21 248 lb (112.5 kg)    Physical Exam Vitals and nursing note reviewed.  Constitutional:      Appearance: She is well-developed.  HENT:     Head: Normocephalic and atraumatic.  Cardiovascular:     Rate and Rhythm: Normal rate and regular rhythm.     Heart sounds: Normal heart sounds. No murmur heard.    No friction rub. No gallop.  Pulmonary:     Effort: Pulmonary effort is normal. No tachypnea or respiratory distress.     Breath sounds: Normal breath sounds. No decreased breath sounds, wheezing, rhonchi or rales.  Chest:     Chest wall: No tenderness.    Abdominal:     General: Bowel sounds are normal.     Palpations: Abdomen is soft.  Musculoskeletal:        General: Normal range of motion.     Cervical back: Normal range of motion.  Skin:    General: Skin is warm  and dry.  Neurological:     Mental Status: She is alert and oriented to person, place, and time.     Coordination: Coordination normal.  Psychiatric:        Behavior: Behavior normal. Behavior is cooperative.        Thought Content: Thought content normal.        Judgment: Judgment normal.          Patient has been counseled extensively about nutrition and exercise as well as the importance of adherence with medications and regular follow-up. The patient was given clear instructions to go to ER or return to medical center if symptoms don't improve, worsen  or new problems develop. The patient verbalized understanding.   Follow-up: Return if symptoms worsen or fail to improve.   Gildardo Pounds, FNP-BC [facility], [place]   07/26/2021, 3:18 PM

## 2021-07-29 ENCOUNTER — Other Ambulatory Visit (HOSPITAL_COMMUNITY): Payer: Self-pay

## 2021-07-31 ENCOUNTER — Encounter: Payer: Self-pay | Admitting: Nurse Practitioner

## 2021-07-31 ENCOUNTER — Other Ambulatory Visit: Payer: Self-pay | Admitting: Nurse Practitioner

## 2021-07-31 DIAGNOSIS — L731 Pseudofolliculitis barbae: Secondary | ICD-10-CM

## 2021-07-31 DIAGNOSIS — Z1231 Encounter for screening mammogram for malignant neoplasm of breast: Secondary | ICD-10-CM

## 2021-08-09 ENCOUNTER — Other Ambulatory Visit (HOSPITAL_COMMUNITY): Payer: Self-pay

## 2021-08-13 ENCOUNTER — Other Ambulatory Visit (HOSPITAL_COMMUNITY): Payer: Self-pay

## 2021-08-20 ENCOUNTER — Other Ambulatory Visit: Payer: Self-pay | Admitting: Internal Medicine

## 2021-08-28 ENCOUNTER — Other Ambulatory Visit: Payer: Self-pay | Admitting: Nurse Practitioner

## 2021-08-28 DIAGNOSIS — N6331 Unspecified lump in axillary tail of the right breast: Secondary | ICD-10-CM

## 2021-08-28 DIAGNOSIS — Z1231 Encounter for screening mammogram for malignant neoplasm of breast: Secondary | ICD-10-CM

## 2021-08-28 NOTE — Telephone Encounter (Signed)
Left HIPAA-compliant voicemail. Will also send her a MyChart message. Asked her to call us or reach out with any questions.

## 2021-09-09 ENCOUNTER — Encounter: Payer: Self-pay | Admitting: Internal Medicine

## 2021-09-09 ENCOUNTER — Other Ambulatory Visit: Payer: Self-pay

## 2021-09-09 ENCOUNTER — Ambulatory Visit: Payer: PRIVATE HEALTH INSURANCE | Admitting: Pharmacist

## 2021-09-09 ENCOUNTER — Ambulatory Visit (INDEPENDENT_AMBULATORY_CARE_PROVIDER_SITE_OTHER): Payer: PRIVATE HEALTH INSURANCE | Admitting: Internal Medicine

## 2021-09-09 VITALS — Wt 241.0 lb

## 2021-09-09 DIAGNOSIS — B182 Chronic viral hepatitis C: Secondary | ICD-10-CM | POA: Diagnosis not present

## 2021-09-09 NOTE — Progress Notes (Signed)
[facility] for Infectious Diseases                                      [address], [place], [place], [address]                                               Phn. [phone]; Fax: [phone]                                                               Date:  Reason for Visit: Hepatitis C    HPI:  Heidi Bartlett is a 41 y.o.old female with HCV. She was found to have HCV per on routine screening on 04/30/21, referred to ID. VL 2,170,000 on 04/30/21. 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 She moved to [facility] in 2017 for a fresh start. Pt has been incarcerated in the past.  She thinks she contracted HCV from sisiter(needle sharing). Pt thinks she was told she was HCV positive about 9 years ago.  Father passed from liver cirrhosis, etiology unclear.   Today 7/24. PT completed Epclusa x 12 weeks about 2 weeks ago.  Reports no missed doses. Start date 06/03/21-08/25/21   Denies any hospitalizations related to liver disease, jaundice, ascites, GI bleeding, mental status changes, abdominal pain and acholic stool.    ROS: Denies yellowish discoloration of sclera and skin, abdominal pain/distension, hematemesis.  Denis cough, fever, chills, nightsweats, nausea, vomiting, diarrhea, constipation, weight loss, recent hospitalizations, rashes, joint complaints, shortness of breath, chest pain, headaches, dysuria .   Current Outpatient Medications on File Prior to Visit  Medication Sig Dispense Refill   busPIRone (BUSPAR) 15 MG tablet Take 1 tablet (15 mg total) by mouth 2 (two) times daily. For anxiety 60 tablet 1   cyclobenzaprine (FLEXERIL) 10 MG tablet TAKE 1 TABLET BY MOUTH AT BEDTIME 30 tablet 2   gabapentin (NEURONTIN) 600 MG tablet TAKE 1 TABLET BY MOUTH AT BEDTIME 30 tablet 4   hydrOXYzine (VISTARIL) 25 MG capsule TAKE 1 CAPSULE BY MOUTH TWICE DAILY AS NEEDED FOR ANXIETY 180 capsule 0   medroxyPROGESTERone (PROVERA) 10  MG tablet Take one by mouth daily until 01/31/20, then take one a day on day 1-14 each month starting 02/18/2020 60 tablet 10   metFORMIN (GLUCOPHAGE) 500 MG tablet Take 1 tablet (500 mg total) by mouth daily with breakfast. 30 tablet 11   mupirocin ointment (BACTROBAN) 2 % Apply 1 application. topically 2 (two) times daily. 22 g 0   nystatin (MYCOSTATIN/NYSTOP) powder Apply 1 application topically 2 (two) times daily. 15 g 0   spironolactone (ALDACTONE) 100 MG tablet Take 1 tablet (100 mg total) by mouth 2 (two) times daily. 60 tablet 11   Sofosbuvir-Velpatasvir (EPCLUSA) 400-100 MG TABS Take 1 tablet by mouth daily. (Patient not taking: Reported on 09/09/2021) 28 tablet 2   No current facility-administered medications on file prior to visit.   Allergies  Allergen Reactions   Dilaudid [Hydromorphone] Itching   Codeine Hives   Retin-A [Tretinoin] Other (See Comments)  Pt reports burning   Tape Rash    Past Medical History:  Diagnosis Date   Abnormal uterine bleeding (AUB)    Anxiety    Pre-diabetes     Past Surgical History:  Procedure Laterality Date   CESAREAN SECTION  2004   CHOLECYSTECTOMY  2004   LAPAROSCOPIC UNILATERAL SALPINGO OOPHERECTOMY Right 11/15/2019   Procedure: LAPAROSCOPIC RIGHT SALPINGO OOPHORECTOMY AND RIGHT OVARIAN CYSTECTOMY;  Surgeon: Woodroe Mode, MD;  Location: [facility];  Service: Gynecology;  Laterality: Right;    Social History   Socioeconomic History   Marital status: Married    Spouse name: Not on file   Number of children: 0   Years of education: 9th grade   Highest education level: Not on file  Occupational History   Occupation: unemployed  Tobacco Use   Smoking status: Every Day    Packs/day: 0.50    Types: Cigarettes   Smokeless tobacco: Never  Vaping Use   Vaping Use: Never used  Substance and Sexual Activity   Alcohol use: Not Currently   Drug use: Not Currently   Sexual activity: Yes    Birth control/protection:  None  Other Topics Concern   Not on file  Social History Narrative   Not on file   Social Determinants of Health   Financial Resource Strain: Not on file  Food Insecurity: No Food Insecurity (07/26/2021)   Hunger Vital Sign    Worried About Running Out of Food in the Last Year: Never true    Ran Out of Food in the Last Year: Never true  Transportation Needs: No Transportation Needs (01/25/2021)   PRAPARE - Hydrologist (Medical): No    Lack of Transportation (Non-Medical): No  Physical Activity: Not on file  Stress: Not on file  Social Connections: Not on file  Intimate Partner Violence: Not on file    Family History  Problem Relation Age of Onset   Breast cancer Mother     Physical exam: Wt 241 lb (109.3 kg)   LMP 09/05/2021   BMI 36.64 kg/m   Gen: Alert and oriented x 3, no acute distress HEENT: [place]/AT, PERL, EOMI, no scleral icterus, no pale conjunctivae, hearing normal, oral mucosa moist Neck: Supple, no lymphadenopathy Cardio: Regular rate and rhythm; +S1 and S2; no murmurs, gallops, or rubs Resp: CTAB; no wheezes, rhonchi, or rales GI: Soft, nontender, nondistended, bowel sounds present GU: Musc: Extremities: No cyanosis, clubbing, or edema; +2 PT and DP pulses Skin: No rashes, lesions, or ecchymoses Neuro: No focal deficits Psych: Calm, cooperative   Laboratory  PT/PTT/INR WNL Hep A and B serologies as below HIV NR 05/08/21 HCV genotype IA HCV RNA 2.89 M at start of treatment on 3/22 Measure of fibrosis: FI(minimal fibrosis) U/S with electrography N(3.4KpA). mild splenic enlargement on 05/13/21   Assessment/Plan:  #Chronic Hepatitis C SP treatment -Completed 12 week  Epclusa weeks(2 weeks ago). Mavyret was denied -HCV RNA on 5/15 ND -Prior treatment: none -GT:1A -Evidence of cirrhosis: none, F1 stage on 05/08/21  Plan: HCV labs today and SVR in 3 months at F/U HAV non immune on 04/18/21-1st dose 05/31/21, next due  11/30/21 HBV s AB and AG negative on 05/08/21. 1sr dose 05/31/21 , 2nd dose 07/01/21, next dose due 11/30/21 - [place] screening with [place] every 6 months - EGD to r/o varices in cirrhotics Smoking-yes(not interested in cessation)/Denies EtoH use.   Counseling done on the following -Natural progression of hep c, transmission (  avoid sharing personal hygiene equipment), prevention, risks of left untreated and treatment options  -Avoid hepatotoxins like alcohol and excessive acetamaminphen (no more than 2 gram a day) -Avoid eating raw sea food -Risks of re-infection  -Hepatitis coinfection and vaccination( Pneumococcal vaccination in the cirrhotics     Electronically signed by:  Laurice Record, MD Infectious Diseases  Fax no. [phone]

## 2021-09-14 LAB — HCV RNA, QUANT REAL-TIME PCR W/REFLEX
HCV RNA, PCR, QN (Log): <1.18 LogIU/mL
HCV RNA, PCR, QN: <15 IU/mL

## 2021-09-14 LAB — COMPLETE METABOLIC PANEL WITH GFR
AG Ratio: 1.6 (calc) (ref 1.0–2.5)
ALT: 27 U/L (ref 6–29)
AST: 22 U/L (ref 10–30)
Albumin: 4.3 g/dL (ref 3.6–5.1)
Alkaline phosphatase (APISO): 54 U/L (ref 31–125)
BUN: 9 mg/dL (ref 7–25)
CO2: 23 mmol/L (ref 20–32)
Calcium: 9.3 mg/dL (ref 8.6–10.2)
Chloride: 104 mmol/L (ref 98–110)
Creat: 0.76 mg/dL (ref 0.50–0.99)
Globulin: 2.7 g/dL (calc) (ref 1.9–3.7)
Glucose, Bld: 125 mg/dL — ABNORMAL HIGH (ref 65–99)
Potassium: 3.5 mmol/L (ref 3.5–5.3)
Sodium: 139 mmol/L (ref 135–146)
Total Bilirubin: 0.4 mg/dL (ref 0.2–1.2)
Total Protein: 7 g/dL (ref 6.1–8.1)
eGFR: 102 mL/min/{1.73_m2} (ref >=60)

## 2021-09-14 LAB — CBC WITH DIFFERENTIAL/PLATELET
Absolute Monocytes: 485 cells/uL (ref 200–950)
Basophils Absolute: 68 cells/uL (ref 0–200)
Basophils Relative: 0.8 %
Eosinophils Absolute: 238 cells/uL (ref 15–500)
Eosinophils Relative: 2.8 %
HCT: 40.2 % (ref 35.0–45.0)
Hemoglobin: 13.8 g/dL (ref 11.7–15.5)
Lymphs Abs: 2542 cells/uL (ref 850–3900)
MCH: 29.9 pg (ref 27.0–33.0)
MCHC: 34.3 g/dL (ref 32.0–36.0)
MCV: 87 fL (ref 80.0–100.0)
MPV: 12.1 fL (ref 7.5–12.5)
Monocytes Relative: 5.7 %
Neutro Abs: 5168 cells/uL (ref 1500–7800)
Neutrophils Relative %: 60.8 %
Platelets: 182 10*3/uL (ref 140–400)
RBC: 4.62 10*6/uL (ref 3.80–5.10)
RDW: 12.9 % (ref 11.0–15.0)
Total Lymphocyte: 29.9 %
WBC: 8.5 10*3/uL (ref 3.8–10.8)

## 2021-09-16 ENCOUNTER — Ambulatory Visit
Admission: RE | Admit: 2021-09-16 | Discharge: 2021-09-16 | Disposition: A | Discharge status: Home / Self Care | Payer: PRIVATE HEALTH INSURANCE | Source: Ambulatory Visit | Attending: Nurse Practitioner | Admitting: Nurse Practitioner

## 2021-09-16 DIAGNOSIS — Z1231 Encounter for screening mammogram for malignant neoplasm of breast: Secondary | ICD-10-CM

## 2021-09-16 DIAGNOSIS — N6331 Unspecified lump in axillary tail of the right breast: Secondary | ICD-10-CM

## 2021-10-01 ENCOUNTER — Other Ambulatory Visit: Payer: Self-pay | Admitting: Internal Medicine

## 2021-10-01 DIAGNOSIS — G8929 Other chronic pain: Secondary | ICD-10-CM

## 2021-10-08 ENCOUNTER — Encounter: Payer: Self-pay | Admitting: Internal Medicine

## 2021-10-08 ENCOUNTER — Ambulatory Visit: Payer: PRIVATE HEALTH INSURANCE | Attending: Internal Medicine | Admitting: Internal Medicine

## 2021-10-08 VITALS — BP 102/71 | HR 82 | Temp 98.6°F | Ht 68.0 in | Wt 239.0 lb

## 2021-10-08 DIAGNOSIS — B182 Chronic viral hepatitis C: Secondary | ICD-10-CM | POA: Diagnosis not present

## 2021-10-08 DIAGNOSIS — N939 Abnormal uterine and vaginal bleeding, unspecified: Secondary | ICD-10-CM | POA: Diagnosis not present

## 2021-10-08 DIAGNOSIS — F17210 Nicotine dependence, cigarettes, uncomplicated: Secondary | ICD-10-CM | POA: Diagnosis not present

## 2021-10-08 DIAGNOSIS — I10 Essential (primary) hypertension: Secondary | ICD-10-CM

## 2021-10-08 DIAGNOSIS — Z23 Encounter for immunization: Secondary | ICD-10-CM

## 2021-10-08 DIAGNOSIS — Z6836 Body mass index (BMI) 36.0-36.9, adult: Secondary | ICD-10-CM

## 2021-10-08 NOTE — Progress Notes (Signed)
Patient ID: Heidi Bartlett, female    DOB: 10-21-1980  MRN: [id number]  CC: No chief complaint on file.   Subjective: Ronalee Scheunemann is a 41 y.o. female who presents for chronic ds management Her concerns today include:  History of HTN, obesity, hep C,  tob dep, chronic right-sided lower back pain, DUB/PCOS, preDM.  Hep C: completed Eclupsa through ID.  Viral load post completion of treatment was undetectable.  She has follow-up appointment in October for repeat lab test to determine SVR.  HTN: Reports compliance with taking spironolactone.  She does not overuse salt in the foods.  No chest pains or shortness of breath.   DUB: stopped Provera a while back.  No heavy prolong bleeding recently and menses now regular.  PreDM/Obesity:  down 9 lbs since I saw her 4 mths ago.   Taking Lipozene (some type of weight loss supplement from OTC) once a day OTC x 1 mth.  More energy and walking more in evenings several days a wk   Dec appetite with Lipozene  Patient Active Problem List   Diagnosis Date Noted   Chronic hepatitis C without hepatic coma ([place]) 05/31/2021   Mixed hyperlipidemia 05/02/2021   Elevated liver enzymes 05/02/2021   Essential hypertension 09/14/2020   Prediabetes 01/03/2020   Obesity (BMI 30-39.9) 01/03/2020   Alopecia 01/03/2020   Right ovarian cyst 10/17/2019   Dysfunctional uterine bleeding 07/02/2018   Sciatica of right side 11/24/2017   Class 2 severe obesity due to excess calories with serious comorbidity and body mass index (BMI) of 37.0 to 37.9 in adult [facility]) 11/24/2017   Tobacco dependence 11/24/2017     Current Outpatient Medications on File Prior to Visit  Medication Sig Dispense Refill   busPIRone (BUSPAR) 15 MG tablet Take 1 tablet (15 mg total) by mouth 2 (two) times daily. For anxiety 60 tablet 1   cyclobenzaprine (FLEXERIL) 10 MG tablet TAKE 1 TABLET BY MOUTH AT BEDTIME 30 tablet 0   gabapentin (NEURONTIN) 600 MG tablet TAKE 1 TABLET BY MOUTH  AT BEDTIME 30 tablet 4   hydrOXYzine (VISTARIL) 25 MG capsule TAKE 1 CAPSULE BY MOUTH TWICE DAILY AS NEEDED FOR ANXIETY 180 capsule 0   metFORMIN (GLUCOPHAGE) 500 MG tablet Take 1 tablet (500 mg total) by mouth daily with breakfast. 30 tablet 11   spironolactone (ALDACTONE) 100 MG tablet Take 1 tablet (100 mg total) by mouth 2 (two) times daily. 60 tablet 11   mupirocin ointment (BACTROBAN) 2 % Apply 1 application. topically 2 (two) times daily. (Patient not taking: Reported on 10/08/2021) 22 g 0   No current facility-administered medications on file prior to visit.    Allergies  Allergen Reactions   Dilaudid [Hydromorphone] Itching   Codeine Hives   Retin-A [Tretinoin] Other (See Comments)    Pt reports burning   Tape Rash    Social History   Socioeconomic History   Marital status: Married    Spouse name: Not on file   Number of children: 0   Years of education: 9th grade   Highest education level: Not on file  Occupational History   Occupation: unemployed  Tobacco Use   Smoking status: Every Day    Packs/day: 0.50    Types: Cigarettes   Smokeless tobacco: Never  Vaping Use   Vaping Use: Never used  Substance and Sexual Activity   Alcohol use: Not Currently   Drug use: Not Currently   Sexual activity: Yes    Birth control/protection:  None  Other Topics Concern   Not on file  Social History Narrative   Not on file   Social Determinants of Health   Financial Resource Strain: Not on file  Food Insecurity: No Food Insecurity (07/26/2021)   Hunger Vital Sign    Worried About Running Out of Food in the Last Year: Never true    Ran Out of Food in the Last Year: Never true  Transportation Needs: No Transportation Needs (01/25/2021)   PRAPARE - Hydrologist (Medical): No    Lack of Transportation (Non-Medical): No  Physical Activity: Not on file  Stress: Not on file  Social Connections: Not on file  Intimate Partner Violence: Not on file     Family History  Problem Relation Age of Onset   Breast cancer Mother        36s    Past Surgical History:  Procedure Laterality Date   CESAREAN SECTION  2004   CHOLECYSTECTOMY  2004   LAPAROSCOPIC UNILATERAL SALPINGO OOPHERECTOMY Right 11/15/2019   Procedure: LAPAROSCOPIC RIGHT SALPINGO OOPHORECTOMY AND RIGHT OVARIAN CYSTECTOMY;  Surgeon: Woodroe Mode, MD;  Location: [facility];  Service: Gynecology;  Laterality: Right;    ROS: Review of Systems Negative except as stated above  PHYSICAL EXAM: BP 102/71 (BP Location: Right Arm, Patient Position: Sitting, Cuff Size: Large)   Pulse 82   Temp 98.6 F (37 C)   Ht '5\' 8"'$  (1.727 m)   Wt 239 lb (108.4 kg)   LMP 09/05/2021   SpO2 99%   BMI 36.34 kg/m   Wt Readings from Last 3 Encounters:  10/08/21 239 lb (108.4 kg)  09/09/21 241 lb (109.3 kg)  07/26/21 246 lb 6.4 oz (111.8 kg)    Physical Exam  General appearance - alert, well appearing, middle-age obese Caucasian female and in no distress Mental status - normal mood, behavior, speech, dress, motor activity, and thought processes Chest - clear to auscultation, no wheezes, rales or rhonchi, symmetric air entry Heart - normal rate, regular rhythm, normal S1, S2, no murmurs, rubs, clicks or gallops Extremities - peripheral pulses normal, no pedal edema, no clubbing or cyanosis      Latest Ref Rng & Units 09/09/2021   10:51 AM 07/01/2021    2:17 PM 05/08/2021   11:10 AM  CMP  Glucose 65 - 99 mg/dL 125  103  113   BUN 7 - 25 mg/dL '9  10  11   '$ Creatinine 0.50 - 0.99 mg/dL 0.76  0.90  0.73   Sodium 135 - 146 mmol/L 139  139  138   Potassium 3.5 - 5.3 mmol/L 3.5  4.0  4.0   Chloride 98 - 110 mmol/L 104  103  106   CO2 20 - 32 mmol/L '23  25  24   '$ Calcium 8.6 - 10.2 mg/dL 9.3  9.8  9.5   Total Protein 6.1 - 8.1 g/dL 7.0  7.2  7.1   Total Bilirubin 0.2 - 1.2 mg/dL 0.4  0.5  0.4   AST 10 - 30 U/L 22  25  45   ALT 6 - 29 U/L 27  34  60    63    Lipid Panel      Component Value Date/Time   CHOL 236 (H) 04/30/2021 1108   TRIG 159 (H) 04/30/2021 1108   HDL 48 04/30/2021 1108   LDLCALC 159 (H) 04/30/2021 1108    CBC    Component Value  Date/Time   WBC 8.5 09/09/2021 1051   RBC 4.62 09/09/2021 1051   HGB 13.8 09/09/2021 1051   HGB 14.8 04/30/2021 1108   HCT 40.2 09/09/2021 1051   HCT 43.9 04/30/2021 1108   PLT 182 09/09/2021 1051   PLT 185 04/30/2021 1108   MCV 87.0 09/09/2021 1051   MCV 85 04/30/2021 1108   MCH 29.9 09/09/2021 1051   MCHC 34.3 09/09/2021 1051   RDW 12.9 09/09/2021 1051   RDW 12.6 04/30/2021 1108   LYMPHSABS 2,542 09/09/2021 1051   LYMPHSABS 3.0 04/30/2021 1108   MONOABS 0.5 01/10/2017 0929   EOSABS 238 09/09/2021 1051   EOSABS 0.3 04/30/2021 1108   BASOSABS 68 09/09/2021 1051   BASOSABS 0.1 04/30/2021 1108    ASSESSMENT AND PLAN: 1. Essential hypertension At goal.  Continue spironolactone.  Continue to limit salt in the foods.  2. Class 2 severe obesity due to excess calories with serious comorbidity and body mass index (BMI) of 36.0 to 36.9 in adult [facility]) Commended her on weight loss so far.  I told her that I am not familiar with Lipozene as a weight loss supplement. Encouraged her to continue regular exercise with goal of getting in about 150 minutes/week total of moderate intensity exercise.  Discussed on encourage healthy eating habits.  3. Chronic hepatitis C without hepatic coma (HCC) Undetectable after completion of Epclusa last month.  Levels plan to be checked again in 3 months after completion to see if she has maintained to sustain viral response/your  4. Need for influenza vaccination Given today.  5.  Abnormal vaginal bleeding This has resolved.  Menses have returned to regular and light.  I have removed Provera from her med list.   Patient was given the opportunity to ask questions.  Patient verbalized understanding of the plan and was able to repeat key elements of the plan.   This  documentation was completed using Radio producer.  Any transcriptional errors are unintentional.  No orders of the defined types were placed in this encounter.    Requested Prescriptions    No prescriptions requested or ordered in this encounter    Return in about 4 months (around 02/07/2022).  Karle Plumber, MD, FACP

## 2021-10-14 ENCOUNTER — Encounter: Payer: PRIVATE HEALTH INSURANCE | Admitting: Registered"

## 2021-10-18 IMAGING — MR MR PELVIS WO/W CM
9 of 13 series · 34 of 48 positions shown · IV contrast (gadavist)
Comparison: 08/24/2019 and 01/10/2017

CLINICAL DATA: Right adnexal cystic lesion on ultrasound.
Premenopausal.

EXAM:
MRI PELVIS WITHOUT AND WITH CONTRAST
TECHNIQUE: Multiplanar multisequence MR imaging of the pelvis was performed
both before and after administration of intravenous contrast.
CONTRAST:  10mL GADAVIST GADOBUTROL 1 MMOL/ML IV SOLN

[Series 3: T2 · coronal · 5.0mm · 1.56mm/px · 3 of 33 slices shown (1 of 3)]
[im 1/33]
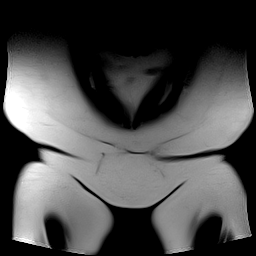
[im 17/33]
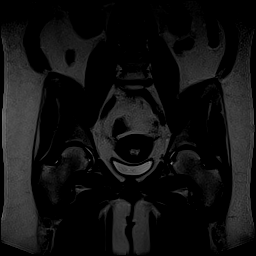
[im 33/33]
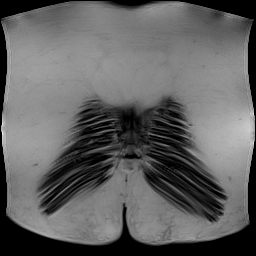

[Series 4: T2 · axial · 5.0mm · 0.51mm/px · z∈[-60,+162]mm · 3 of 38 slices shown (2 of 3)]
[im 1/38]
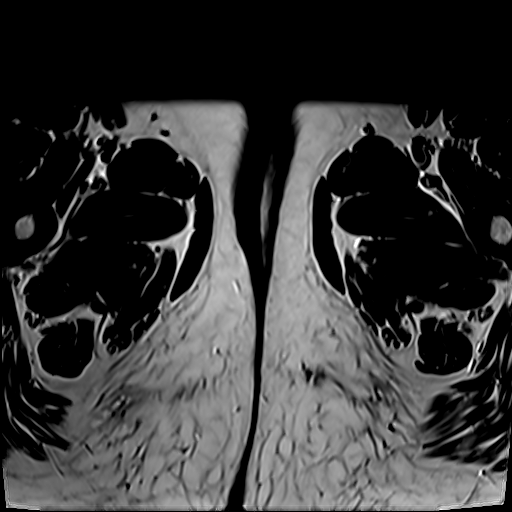
[im 19/38]
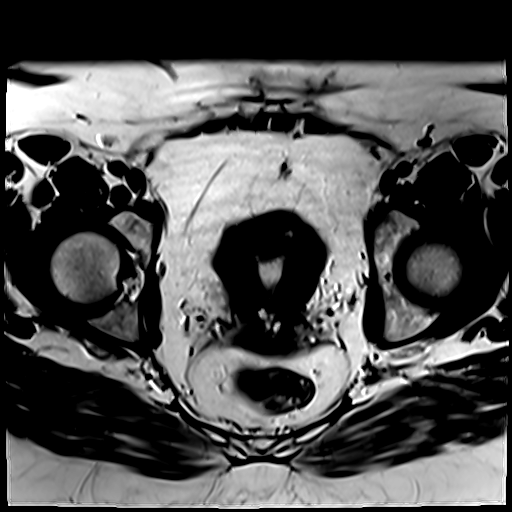
[im 38/38]
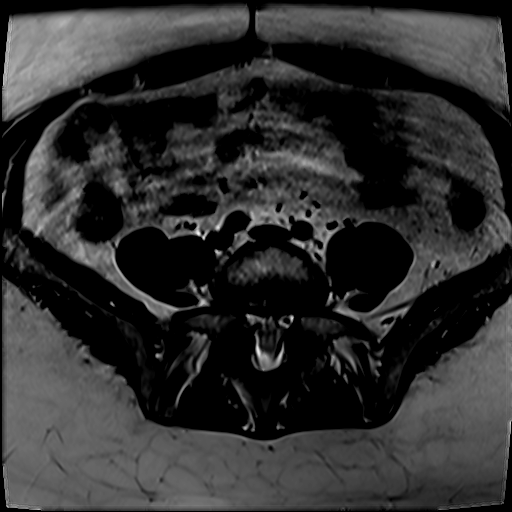

[Series 5: T2 fat-sat · axial · 5.0mm · 0.51mm/px · z∈[-60,+162]mm · 3 of 38 slices shown]
[im 1/38]
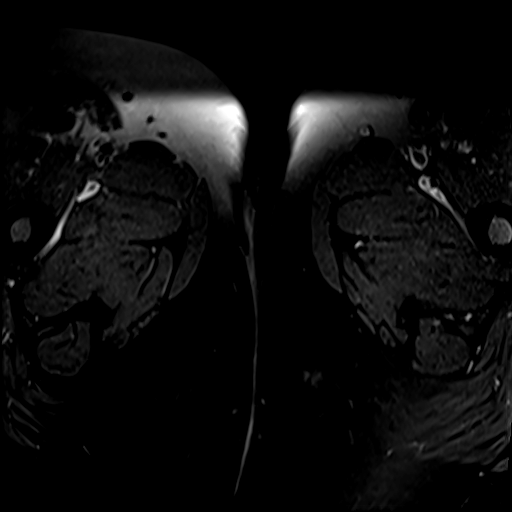
[im 19/38]
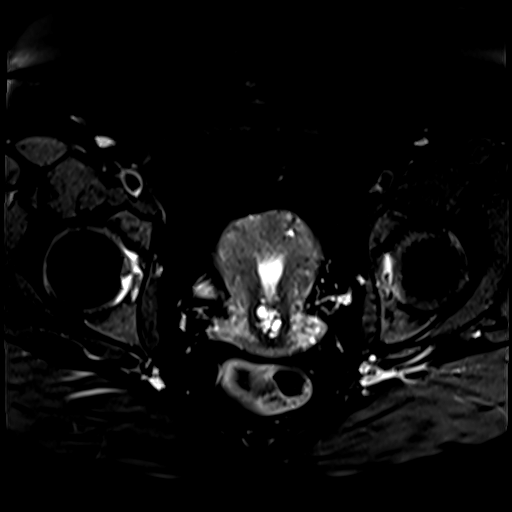
[im 38/38]
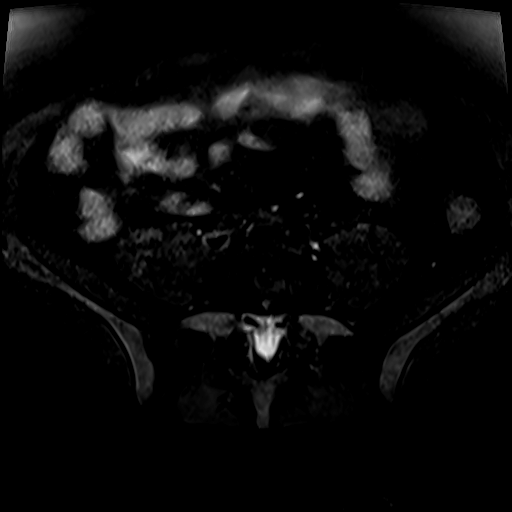

[Series 6: T2 · sagittal · 5.0mm · 0.55mm/px · 3 of 40 slices shown (3 of 3)]
[im 1/40]
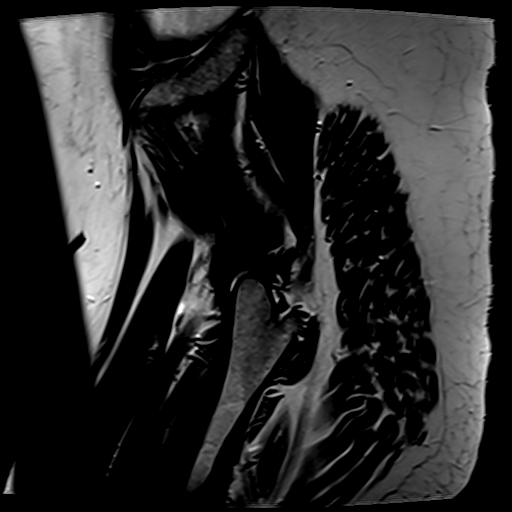
[im 20/40]
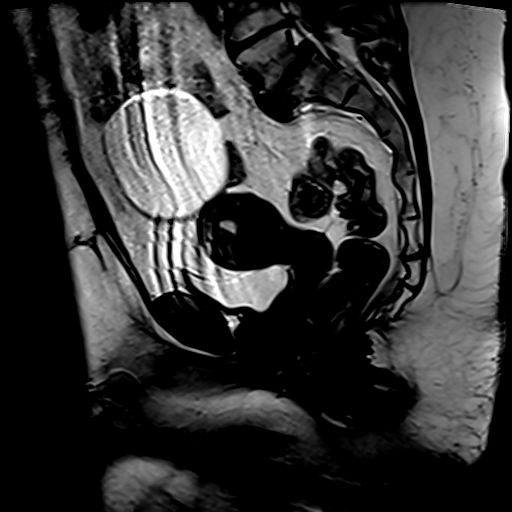
[im 40/40]
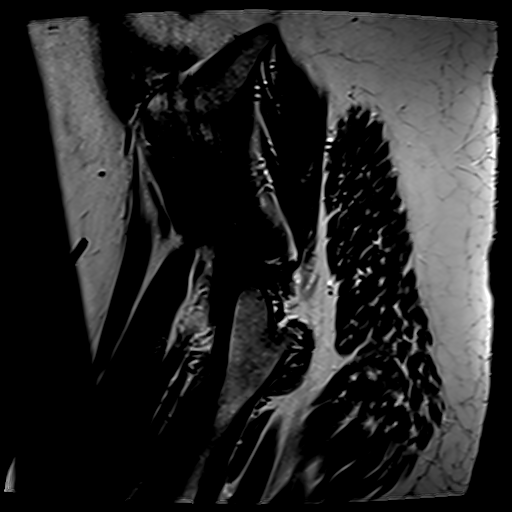

[Series 7: T1 · axial · 4.0mm · 0.84mm/px · z∈[-74,+178]mm · 5 of 64 slices shown (1 of 2)]
[im 1/64]
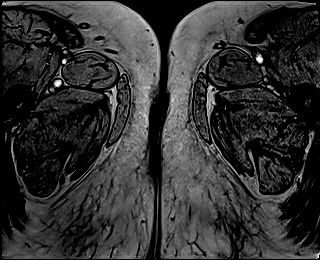
[im 16/64]
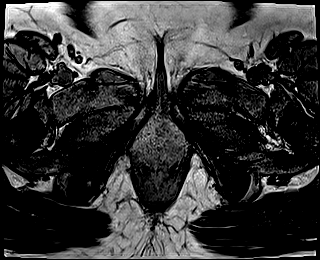
[im 32/64]
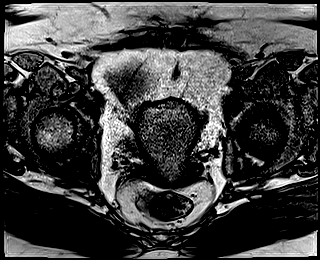
[im 48/64]
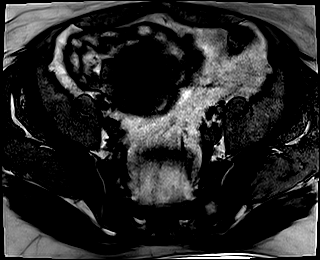
[im 64/64]
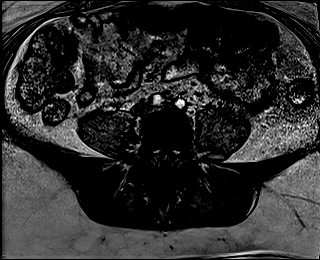

[Series 8: T1 · axial · 4.0mm · 0.84mm/px · z∈[-74,+178]mm · 5 of 64 slices shown (2 of 2)]
[im 1/64]
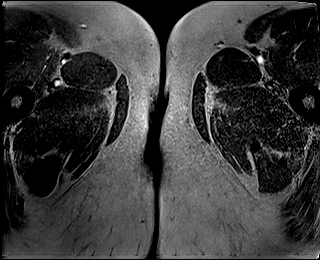
[im 16/64]
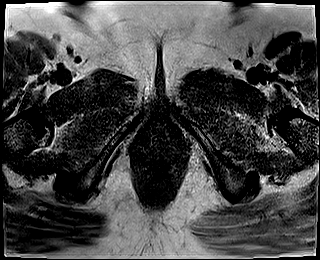
[im 32/64]
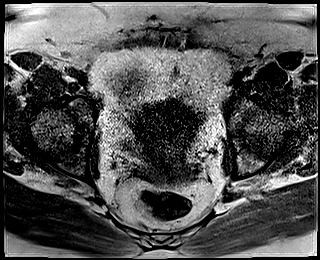
[im 48/64]
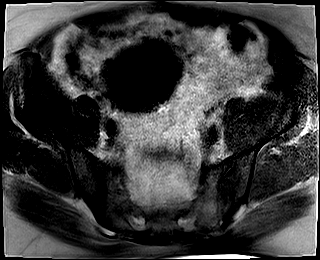
[im 64/64]
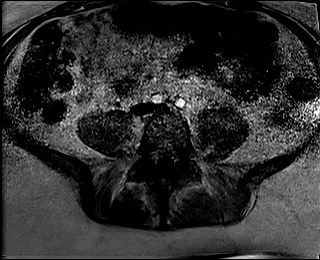

[Series 12: T1 dynamic · axial · 4.0mm · 0.84mm/px · z∈[-73,+163]mm · 4 of 60 slices shown (1 of 3)]
[im 1/60]
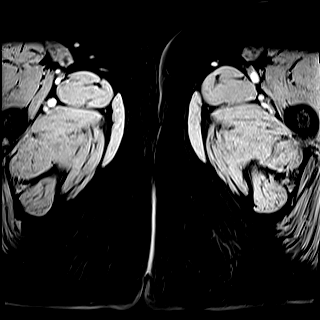
[im 20/60]
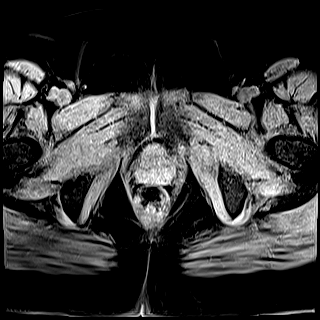
[im 40/60]
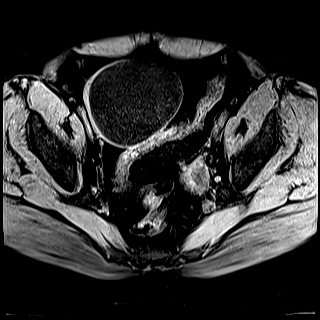
[im 60/60]
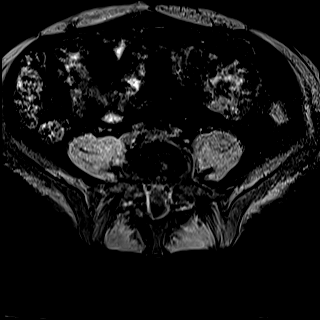

[Series 15: T1 dynamic · axial · 4.0mm · 0.84mm/px · z∈[-73,+163]mm · 4 of 60 slices shown (2 of 3)]
[im 1/60]
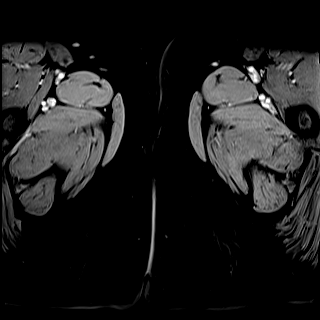
[im 20/60]
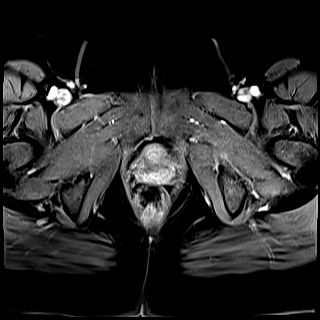
[im 40/60]
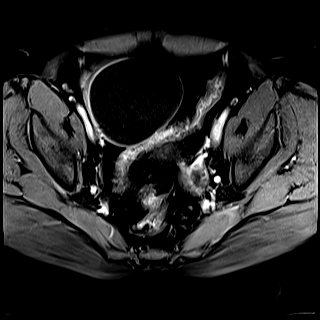
[im 60/60]
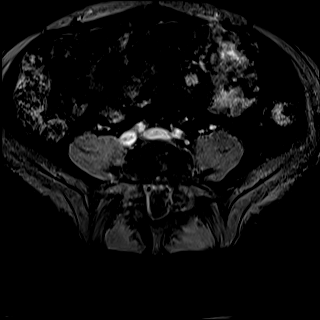

[Series 17: T1 dynamic · axial · 4.0mm · 0.84mm/px · z∈[-73,+163]mm · 4 of 60 slices shown (3 of 3)]
[im 1/60]
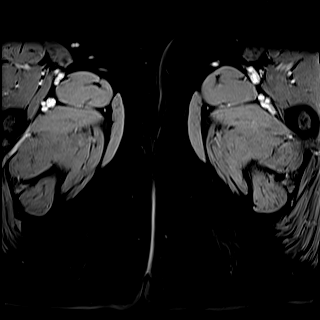
[im 20/60]
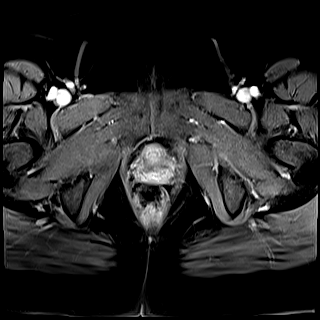
[im 40/60]
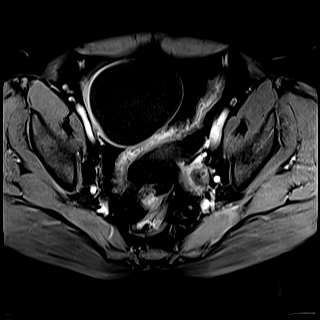
[im 60/60]
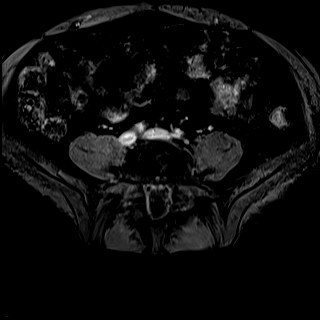

[34 of 48 positions shown; findings below may reference images not displayed]

FINDINGS: Lower Urinary Tract: No urinary bladder or urethral abnormality
identified.

Bowel: Unremarkable appearance of rectum and other pelvic bowel
loops.

Vascular/Lymphatic: Unremarkable. No pathologically enlarged pelvic
lymph nodes identified.

Reproductive:

-- Uterus: Measures 8.1 x 4.6 x 6.0 cm (volume = 120 cm^3). No
fibroids or other masses identified. Endometrial thickness measures
8 mm. Cervix and vagina are unremarkable.

-- Right ovary: A simple cyst is seen which measures 7.8 x 7.1 x
cm. No internal septations, mural nodules, or other soft tissue
component seen. No evidence of contrast enhancement. This lesion
shows gradual increase in size compared to 3014 ultrasound when it
measured 4.6 cm in maximum diameter.

-- Left ovary: Appears normal. No ovarian or adnexal masses
identified.

Other: No peritoneal thickening or abnormal free fluid.

Musculoskeletal:  Unremarkable.
IMPRESSION: 8 cm simple right ovarian cyst, which shows gradual increase in size
compared to ultrasound in 3014. This is suspicious for a benign or
low low-grade serous cystic ovarian neoplasm. Surgical evaluation
should be considered.

Normal appearance of uterus and left ovary.

## 2021-10-21 ENCOUNTER — Other Ambulatory Visit: Payer: Self-pay | Admitting: Internal Medicine

## 2021-10-21 DIAGNOSIS — G8929 Other chronic pain: Secondary | ICD-10-CM

## 2021-10-23 ENCOUNTER — Encounter: Payer: Self-pay | Admitting: Internal Medicine

## 2021-10-23 MED ORDER — CYCLOBENZAPRINE HCL 10 MG PO TABS: 10.0000 mg | ORAL_TABLET | Freq: Every day | ORAL | 1 refills | Status: DC

## 2021-10-31 ENCOUNTER — Ambulatory Visit: Payer: Self-pay

## 2021-10-31 NOTE — Telephone Encounter (Signed)
    Chief Complaint: Both feet have a burning sensation on the bottoms. Some redness noted. Stands on her feet all day at work.Asking to be worked . Symptoms: Above Frequency: yesterday Pertinent Negatives: Patient denies  Disposition: '[]'$ ED /'[]'$ Urgent Care (no appt availability in office) / '[]'$ Appointment(In office/virtual)/ '[]'$  [facility]/ '[]'$ Home Care/ '[]'$ Refused Recommended Disposition /'[]'$ [facility]/ '[x]'$  Follow-up with PCP Additional Notes:   Answer Assessment - Initial Assessment Questions 1. ONSET: "When did the pain start?"      Yesterday 2. LOCATION: "Where is the pain located?"      Both feet 3. PAIN: "How bad is the pain?"    (Scale 1-10; or mild, moderate, severe)  - MILD (1-3): doesn't interfere with normal activities.   - MODERATE (4-7): interferes with normal activities (e.g., work or school) or awakens from sleep, limping.   - SEVERE (8-10): excruciating pain, unable to do any normal activities, unable to walk.      Moderate 4. WORK OR EXERCISE: "Has there been any recent work or exercise that involved this part of the body?"      Stands on feet all. 5. CAUSE: "What do you think is causing the foot pain?"     Unsure 6. OTHER SYMPTOMS: "Do you have any other symptoms?" (e.g., leg pain, rash, fever, numbness)     Red 7. PREGNANCY: "Is there any chance you are pregnant?" "When was your last menstrual period?"     No  Protocols used: Foot Pain-A-AH

## 2021-10-31 NOTE — Telephone Encounter (Signed)
Routing to CMA 

## 2021-11-01 NOTE — Telephone Encounter (Signed)
VM has been left for patient and mychart message has been sent also.

## 2021-11-01 NOTE — Telephone Encounter (Signed)
Pt called again concerning foot pain.  Pt was not contacted by office to be "worked in". Pt will go to [facility] on Tuesday for care. PT will go to [facility] over the weekend if needed.   PT wanted provider to know that her heel bone at the back of her foot is sticking out further than it should be and is very tender.

## 2021-11-01 NOTE — Telephone Encounter (Signed)
This encounter was created in error - please disregard. Please see addended triage note from yesterday.

## 2021-11-04 NOTE — Telephone Encounter (Signed)
Pt was sent a mychart message

## 2021-11-07 ENCOUNTER — Telehealth: Payer: Self-pay | Admitting: Emergency Medicine

## 2021-11-07 ENCOUNTER — Encounter: Payer: Self-pay | Admitting: Physician Assistant

## 2021-11-07 ENCOUNTER — Ambulatory Visit: Payer: PRIVATE HEALTH INSURANCE | Admitting: Physician Assistant

## 2021-11-07 VITALS — BP 130/91 | HR 78

## 2021-11-07 DIAGNOSIS — M79672 Pain in left foot: Secondary | ICD-10-CM | POA: Diagnosis not present

## 2021-11-07 DIAGNOSIS — M79671 Pain in right foot: Secondary | ICD-10-CM

## 2021-11-07 DIAGNOSIS — G629 Polyneuropathy, unspecified: Secondary | ICD-10-CM | POA: Diagnosis not present

## 2021-11-07 MED ORDER — GABAPENTIN 100 MG PO CAPS: 200.0000 mg | ORAL_CAPSULE | Freq: Three times a day (TID) | ORAL | 1 refills | Status: DC

## 2021-11-07 NOTE — Progress Notes (Signed)
Established Patient Office Visit  Subjective   Patient ID: Heidi Bartlett, female    DOB: [birth year]  Age: 41 y.o. MRN: [id number]  Chief Complaint  Patient presents with   Foot Pain    States that she has been having pain in both of her heels.  States that her left heel has been hurting for the past month, worse in the past week.  States that her right heel started hurting last week as well.  States that she is currently treated for neuropathy in both of her feet, states that she takes 600 mg of gabapentin at bedtime due to the burning.  States that the burning is worse in the evening.  States that the 600 mg of gabapentin does make her very sleepy. Denies injury / trauma, numbness or tingling  States that she recently started working in a [employer], states that she works on her feet all day, states that she has to wear steel toed boots, does have shoe inserts.  States that she has tried ibuprofen, naproxen and Advil without relief.  States that the pain was intense last week to where she had to miss 2 days of work.  States that pain is worse towards the afternoon and burning increases in the evening.       Past Medical History:  Diagnosis Date   Abnormal uterine bleeding (AUB)    Anxiety    Pre-diabetes    Social History   Socioeconomic History   Marital status: Married    Spouse name: Not on file   Number of children: 0   Years of education: 9th grade   Highest education level: Not on file  Occupational History   Occupation: unemployed  Tobacco Use   Smoking status: Every Day    Packs/day: 0.50    Types: Cigarettes   Smokeless tobacco: Never  Vaping Use   Vaping Use: Never used  Substance and Sexual Activity   Alcohol use: Not Currently   Drug use: Not Currently   Sexual activity: Yes    Birth control/protection: None  Other Topics Concern   Not on file  Social History Narrative   Not on file   Social Determinants of Health   Financial  Resource Strain: Not on file  Food Insecurity: No Food Insecurity (07/26/2021)   Hunger Vital Sign    Worried About Running Out of Food in the Last Year: Never true    Ran Out of Food in the Last Year: Never true  Transportation Needs: No Transportation Needs (01/25/2021)   PRAPARE - Hydrologist (Medical): No    Lack of Transportation (Non-Medical): No  Physical Activity: Not on file  Stress: Not on file  Social Connections: Not on file  Intimate Partner Violence: Not on file   Family History  Problem Relation Age of Onset   Breast cancer Mother        30s   Allergies  Allergen Reactions   Dilaudid [Hydromorphone] Itching   Codeine Hives   Retin-A [Tretinoin] Other (See Comments)    Pt reports burning   Tape Rash    Review of Systems  Constitutional: Negative.   HENT: Negative.    Eyes: Negative.   Respiratory:  Negative for shortness of breath.   Cardiovascular:  Negative for chest pain.  Gastrointestinal: Negative.   Genitourinary: Negative.   Musculoskeletal: Negative.   Skin: Negative.   Neurological: Negative.  Negative for tingling.  Endo/Heme/Allergies: Negative.  Psychiatric/Behavioral: Negative.        Objective:     BP (!) 130/91 (BP Location: Right Arm)   Pulse 78   LMP 11/06/2021 (Exact Date)    Physical Exam Vitals reviewed.  Constitutional:      Appearance: Normal appearance.  HENT:     Head: Normocephalic and atraumatic.     Right Ear: External ear normal.     Left Ear: External ear normal.     Nose: Nose normal.     Mouth/Throat:     Mouth: Mucous membranes are moist.     Pharynx: Oropharynx is clear.  Eyes:     Extraocular Movements: Extraocular movements intact.     Conjunctiva/sclera: Conjunctivae normal.     Pupils: Pupils are equal, round, and reactive to light.  Cardiovascular:     Rate and Rhythm: Normal rate and regular rhythm.     Pulses: Normal pulses.          Dorsalis pedis pulses are 2+ on  the right side and 2+ on the left side.       Posterior tibial pulses are 2+ on the right side and 2+ on the left side.     Heart sounds: Normal heart sounds.  Pulmonary:     Effort: Pulmonary effort is normal.     Breath sounds: Normal breath sounds.  Musculoskeletal:     Cervical back: Normal range of motion and neck supple.  Feet:     Right foot:     Skin integrity: Callus and dry skin present.     Left foot:     Skin integrity: Callus and dry skin present.     Comments: Tender to touch left heel, bony prominence noted  Neurological:     General: No focal deficit present.     Mental Status: She is alert and oriented to person, place, and time.  Psychiatric:        Mood and Affect: Mood normal.        Behavior: Behavior normal.        Thought Content: Thought content normal.        Judgment: Judgment normal.        Assessment & Plan:   Problem List Items Addressed This Visit   None Visit Diagnoses     Heel pain, bilateral    -  Primary   Relevant Orders   Ambulatory referral to Podiatry   Neuropathy       Relevant Medications   gabapentin (NEURONTIN) 100 MG capsule     1. Heel pain, bilateral Patient education given on supportive care, red flags given for prompt reevaluation - Ambulatory referral to Podiatry  2. Neuropathy Patient is currently taking 600 mg of gabapentin at bedtime, will do trial of gabapentin 200 mg 3 times a day.  Patient education given on supportive care - gabapentin (NEURONTIN) 100 MG capsule; Take 2 capsules (200 mg total) by mouth 3 (three) times daily.  Dispense: 180 capsule; Refill: 1    I have reviewed the patient's medical history (PMH, PSH, Social History, Family History, Medications, and allergies) , and have been updated if relevant. I spent 30 minutes reviewing chart and  face to face time with patient.    Return if symptoms worsen or fail to improve.    Loraine Grip Mayers, PA-C

## 2021-11-07 NOTE — Telephone Encounter (Signed)
Copied from [place] [place] [phone]. Topic: Referral - Status >> Nov 07, 2021  1:19 PM Ja-Kwan M wrote: Reason for CRM: Pt reports that her insurance informed her that the referral that was placed is not in network with her plan however she was advised that Dr. Barkley Bruns with [facility] is in network and she would like to request that the referral be sent to that location.

## 2021-11-07 NOTE — Patient Instructions (Signed)
You are going to split up your dosing of gabapentin into 200 mg 3 times a day.  I encourage you to follow-up with podiatry.  I hope that you feel better soon, please let us know if there is anything else we can do for you  Kennieth Rad, PA-C Physician Assistant [facility] Medicine [email or web address]   Foot Pain Many things can cause foot pain. Some common causes are: An injury. A sprain. Arthritis. Blisters. Bunions. Follow these instructions at home: Managing pain, stiffness, and swelling If directed, put ice on the painful area: Put ice in a plastic bag. Place a towel between your skin and the bag. Leave the ice on for 20 minutes, 2-3 times a day.  Activity Do not stand or walk for long periods. Return to your normal activities as told by your health care provider. Ask your health care provider what activities are safe for you. Do stretches to relieve foot pain and stiffness as told by your health care provider. Do not lift anything that is heavier than 10 lb (4.5 kg), or the limit that you are told, until your health care provider says that it is safe. Lifting a lot of weight can put added pressure on your feet. Lifestyle Wear comfortable, supportive shoes that fit you well. Do not wear high heels. Keep your feet clean and dry. General instructions Take over-the-counter and prescription medicines only as told by your health care provider. Rub your foot gently. Pay attention to any changes in your symptoms. Keep all follow-up visits as told by your health care provider. This is important. Contact a health care provider if: Your pain does not get better after a few days of self-care. Your pain gets worse. You cannot stand on your foot. Get help right away if: Your foot is numb or tingling. Your foot or toes are swollen. Your foot or toes turn white or blue. You have warmth and redness along your foot. Summary Common  causes of foot pain are injury, sprain, arthritis, blisters, or bunions. Ice, medicines, and comfortable shoes may help foot pain. Contact your health care provider if your pain does not get better after a few days of self-care. This information is not intended to replace advice given to you by your health care provider. Make sure you discuss any questions you have with your health care provider. Document Revised: 05/09/2020 Document Reviewed: 05/09/2020 Elsevier Patient Education  [place].

## 2021-11-18 ENCOUNTER — Other Ambulatory Visit: Payer: Self-pay | Admitting: Internal Medicine

## 2021-11-19 NOTE — Telephone Encounter (Signed)
Requested Prescriptions  Pending Prescriptions Disp Refills  . hydrOXYzine (VISTARIL) 25 MG capsule [Pharmacy Med Name: hydrOXYzine Pamoate 25 MG Oral Capsule] 180 capsule 0    Sig: TAKE 1 CAPSULE BY MOUTH TWICE DAILY AS NEEDED FOR ANXIETY     Ear, Nose, and Throat:  Antihistamines 2 Passed - 11/18/2021  3:14 PM      Passed - Cr in normal range and within 360 days    Creat  Date Value Ref Range Status  09/09/2021 0.76 0.50 - 0.99 mg/dL Final         Passed - Valid encounter within last 12 months    Recent Outpatient Visits          1 month ago Essential hypertension   [facility], MD   3 months ago Pseudofolliculitis barbae   [place], Vernia Buff, NP   4 months ago Need for hepatitis B vaccination   [place], Jarome Matin, RPH-CPP   5 months ago Essential hypertension   [place], MD   9 months ago Obesity (BMI 35.0-39.9 without comorbidity)   [place], Deborah B, MD      Future Appointments            In 1 week Daisy Blossom, Jarome Matin, [place]   In 1 week Laurice Record, MD [facility]   In 2 months Thereasa Solo, Cas[place] Bilis [place]

## 2021-11-22 ENCOUNTER — Ambulatory Visit: Payer: Self-pay | Admitting: *Deleted

## 2021-11-22 ENCOUNTER — Ambulatory Visit (HOSPITAL_COMMUNITY)
Admission: EM | Admit: 2021-11-22 | Discharge: 2021-11-22 | Disposition: A | Discharge status: Home / Self Care | Payer: PRIVATE HEALTH INSURANCE

## 2021-11-22 ENCOUNTER — Encounter (HOSPITAL_COMMUNITY): Payer: Self-pay

## 2021-11-22 DIAGNOSIS — M792 Neuralgia and neuritis, unspecified: Secondary | ICD-10-CM

## 2021-11-22 DIAGNOSIS — G629 Polyneuropathy, unspecified: Secondary | ICD-10-CM | POA: Diagnosis not present

## 2021-11-22 NOTE — Telephone Encounter (Signed)
Pt called again. Pt has been waiting at [facility] for 4 hours. Pt was told that they would not be able to help her.  PT will go to ED for care. PT is requesting a referral to a neurologist for foot pain.

## 2021-11-22 NOTE — Telephone Encounter (Signed)
  Chief Complaint: Both feet are burning up to calves.   Gabapentin is not helping. Symptoms: above Frequency: This morning both feet are burning so bad it's hard to wear shoes. Pertinent Negatives: Patient the Gabapentin being helpful Disposition: '[]'$ ED /'[x]'$ Urgent Care (no appt availability in office) / '[]'$ Appointment(In office/virtual)/ '[]'$  [facility]/ '[]'$ Home Care/ '[]'$ Refused Recommended Disposition /'[]'$ [facility]/ '[]'$  Follow-up with PCP Additional Notes: Offered her a  Virtual visit via [facility] however she preferred to go to the "walk in clinic by the hospital".     No appts available at [facility] in person or virtually.

## 2021-11-22 NOTE — Telephone Encounter (Signed)
Reason for Disposition  [1] SEVERE pain (e.g., excruciating, unable to do any normal activities) AND [2] not improved after 2 hours of pain medicine  Answer Assessment - Initial Assessment Questions 1. ONSET: "When did the pain start?"      Both my feet are burning.   At the mobile Unit she gave me some medicine.    She lowered the gabapentin dose.    It's not helping.   My feet are burning like fire today.    My right hand is falling asleep.  This started last week.   Shaking it makes it worse.    2. LOCATION: "Where is the pain located?"      Both feet are burning.   My shoes are off because my feet are so sensitive.   I was seen at the Mobile [facility] in [place].    3. PAIN: "How bad is the pain?"    (Scale 1-10; or mild, moderate, severe)  - MILD (1-3): doesn't interfere with normal activities.   - MODERATE (4-7): interferes with normal activities (e.g., work or school) or awakens from sleep, limping.   - SEVERE (8-10): excruciating pain, unable to do any normal activities, unable to walk.      Severe 4. WORK OR EXERCISE: "Has there been any recent work or exercise that involved this part of the body?"      No 5. CAUSE: "What do you think is causing the foot pain?"     Sounds like neuropathy per provider.  I have pre diabetes.  6. OTHER SYMPTOMS: "Do you have any other symptoms?" (e.g., leg pain, rash, fever, numbness)     Burning is going up both legs to my knees. 7. PREGNANCY: "Is there any chance you are pregnant?" "When was your last menstrual period?"     Not asked  Protocols used: Foot Pain-A-AH

## 2021-11-22 NOTE — ED Triage Notes (Signed)
Patient having burning sensation in both feet. Onset for about a year but getting worse recently. Patient also having numbness in the right hand.   No known injuries or falls. Patient is pre diabetic and taking Metformin. States she is concerned for neuropathy.

## 2021-11-22 NOTE — Telephone Encounter (Signed)
Patient aware of message per Dr. Wynetta Emery.

## 2021-11-22 NOTE — Addendum Note (Signed)
Addended by: Karle Plumber B on: 11/22/2021 05:14 PM   Modules accepted: Orders

## 2021-11-22 NOTE — ED Provider Notes (Signed)
[facility]    CSN: [id number] Arrival date & time: 11/22/21  1054      History   Chief Complaint Chief Complaint  Patient presents with   Foot Pain   Numbness    HPI Heidi Bartlett is a 41 y.o. female.   Patient here today for evaluation of burning sensation in her feet. She notes that she is taking gabapentin but states she has never been told she has neuropathy. Gabapentin has been causing drowsiness and a few weeks ago she had dosing changed so that she would take 200 mg TID in hopes to alleviate side effects. Patient reports she is still drowsy and does not feel medication is effective.   The history is provided by the patient.  Foot Pain    Past Medical History:  Diagnosis Date   Abnormal uterine bleeding (AUB)    Anxiety    Pre-diabetes     Patient Active Problem List   Diagnosis Date Noted   Chronic hepatitis C without hepatic coma ([place]) 05/31/2021   Mixed hyperlipidemia 05/02/2021   Elevated liver enzymes 05/02/2021   Essential hypertension 09/14/2020   Prediabetes 01/03/2020   Obesity (BMI 30-39.9) 01/03/2020   Alopecia 01/03/2020   Right ovarian cyst 10/17/2019   Dysfunctional uterine bleeding 07/02/2018   Sciatica of right side 11/24/2017   Class 2 severe obesity due to excess calories with serious comorbidity and body mass index (BMI) of 37.0 to 37.9 in adult ([place]) 11/24/2017   Tobacco dependence 11/24/2017    Past Surgical History:  Procedure Laterality Date   CESAREAN SECTION  2004   CHOLECYSTECTOMY  2004   LAPAROSCOPIC UNILATERAL SALPINGO OOPHERECTOMY Right 11/15/2019   Procedure: LAPAROSCOPIC RIGHT SALPINGO OOPHORECTOMY AND RIGHT OVARIAN CYSTECTOMY;  Surgeon: Woodroe Mode, MD;  Location: [facility];  Service: Gynecology;  Laterality: Right;    OB History     Gravida  3   Para  1   Term      Preterm  1   AB  2   Living  0      SAB  1   IAB      Ectopic  1   Multiple      Live Births   1            Home Medications    Prior to Admission medications   Medication Sig Start Date End Date Taking? Authorizing Provider  cyclobenzaprine (FLEXERIL) 10 MG tablet Take 1 tablet (10 mg total) by mouth at bedtime. 10/23/21  Yes Ladell Pier, MD  gabapentin (NEURONTIN) 100 MG capsule Take 2 capsules (200 mg total) by mouth 3 (three) times daily. 11/07/21  Yes Mayers, Cari S, PA-C  hydrOXYzine (VISTARIL) 25 MG capsule TAKE 1 CAPSULE BY MOUTH TWICE DAILY AS NEEDED FOR ANXIETY 11/19/21  Yes Ladell Pier, MD  metFORMIN (GLUCOPHAGE) 500 MG tablet Take 1 tablet (500 mg total) by mouth daily with breakfast. 04/05/21  Yes Caren Macadam, MD  spironolactone (ALDACTONE) 100 MG tablet Take 1 tablet (100 mg total) by mouth 2 (two) times daily. 04/05/21  Yes Caren Macadam, MD  busPIRone (BUSPAR) 15 MG tablet Take 1 tablet (15 mg total) by mouth 2 (two) times daily. For anxiety 07/26/21   Gildardo Pounds, NP  mupirocin ointment (BACTROBAN) 2 % Apply 1 application. topically 2 (two) times daily. Patient not taking: Reported on 10/08/2021 04/30/21   Mayers, Loraine Grip, PA-C    Family History Family History  Problem Relation Age of Onset   Breast cancer Mother        54s    Social History Social History   Tobacco Use   Smoking status: Every Day    Packs/day: 0.50    Types: Cigarettes   Smokeless tobacco: Never  Vaping Use   Vaping Use: Never used  Substance Use Topics   Alcohol use: Not Currently   Drug use: Not Currently     Allergies   Dilaudid [hydromorphone], Codeine, Retin-a [tretinoin], and Tape   Review of Systems Review of Systems  Constitutional:  Negative for chills and fever.  Eyes:  Negative for discharge and redness.  Gastrointestinal:  Negative for nausea and vomiting.  Neurological:        Positive for burning sensation of feet     Physical Exam Triage Vital Signs ED Triage Vitals  Enc Vitals Group     BP 11/22/21 1230 125/85      Pulse Rate 11/22/21 1230 91     Resp 11/22/21 1230 16     Temp 11/22/21 1230 98.5 F (36.9 C)     Temp Source 11/22/21 1230 Oral     SpO2 11/22/21 1230 98 %     Weight --      Height --      Head Circumference --      Peak Flow --      Pain Score 11/22/21 1234 10     Pain Loc --      Pain Edu? --      Excl. in [place]? --    No data found.  Updated Vital Signs BP 125/85 (BP Location: Left Arm)   Pulse 91   Temp 98.5 F (36.9 C) (Oral)   Resp 16   LMP 11/06/2021 (Exact Date)   SpO2 98%      Physical Exam Vitals and nursing note reviewed.  Constitutional:      General: She is not in acute distress.    Appearance: Normal appearance. She is not ill-appearing.  HENT:     Head: Normocephalic and atraumatic.  Eyes:     Conjunctiva/sclera: Conjunctivae normal.  Cardiovascular:     Rate and Rhythm: Normal rate.  Pulmonary:     Effort: Pulmonary effort is normal. No respiratory distress.  Musculoskeletal:     Comments: Normal appearance to bilateral feet  Neurological:     Mental Status: She is alert.  Psychiatric:        Mood and Affect: Mood normal.        Behavior: Behavior normal.        Thought Content: Thought content normal.      UC Treatments / Results  Labs (all labs ordered are listed, but only abnormal results are displayed) Labs Reviewed - No data to display  EKG   Radiology No results found.  Procedures Procedures (including critical care time)  Medications Ordered in UC Medications - No data to display  Initial Impression / Assessment and Plan / UC Course  I have reviewed the triage vital signs and the nursing notes.  Pertinent labs & imaging results that were available during my care of the patient were reviewed by me and considered in my medical decision making (see chart for details).    Patient does not wish to trial increased dose of gabapentin due to side effects of drowsiness. Discussed OTC treatment with alpha-lipoic acid in hopes for  improvement of symptoms. Encouraged follow up with either podiatry (possible calcaneal spur) and/or  neuro. Encouraged patient to continue to follow with PCP for further instruction. Patient expresses understanding.   Final Clinical Impressions(s) / UC Diagnoses   Final diagnoses:  Neuropathy   Discharge Instructions   None    ED Prescriptions   None    PDMP not reviewed this encounter.   Francene Finders, PA-C 11/22/21 1323

## 2021-11-27 ENCOUNTER — Ambulatory Visit: Payer: PRIVATE HEALTH INSURANCE | Admitting: Registered"

## 2021-11-28 ENCOUNTER — Telehealth: Payer: Self-pay | Admitting: Emergency Medicine

## 2021-11-28 NOTE — Telephone Encounter (Signed)
Copied from [place] [id number]. Topic: General - Inquiry >> Nov 28, 2021  2:10 PM Marcellus Scott wrote: Reason for CRM: Pt calling regarding her referral to neurology stated [facility] is charging her $200.00 upfront and she does not have it. Pt stated her insurance is network with [facility] [phone], [place]  [address]  Main: [phone] Fax: [phone]

## 2021-12-02 ENCOUNTER — Other Ambulatory Visit: Payer: Self-pay

## 2021-12-02 ENCOUNTER — Ambulatory Visit (INDEPENDENT_AMBULATORY_CARE_PROVIDER_SITE_OTHER): Payer: PRIVATE HEALTH INSURANCE | Admitting: Internal Medicine

## 2021-12-02 ENCOUNTER — Ambulatory Visit: Payer: PRIVATE HEALTH INSURANCE | Attending: Internal Medicine | Admitting: Pharmacist

## 2021-12-02 ENCOUNTER — Encounter: Payer: Self-pay | Admitting: Internal Medicine

## 2021-12-02 ENCOUNTER — Telehealth: Payer: Self-pay

## 2021-12-02 VITALS — BP 133/94 | HR 96 | Temp 98.2°F | Wt 235.0 lb

## 2021-12-02 DIAGNOSIS — Z23 Encounter for immunization: Secondary | ICD-10-CM | POA: Diagnosis not present

## 2021-12-02 DIAGNOSIS — B182 Chronic viral hepatitis C: Secondary | ICD-10-CM

## 2021-12-02 NOTE — Progress Notes (Signed)
Patient presents for vaccination against Hepatitis A and Hepatitis B per orders of Dr. Wynetta Emery. Consent given. Counseling provided. No contraindications exists. Vaccine administered without incident.   Joseph Art, Pharm.D. PGY-2 Ambulatory Care Pharmacy Resident 12/02/2021 8:52 AM

## 2021-12-02 NOTE — Telephone Encounter (Signed)
Patient called requesting note for work confirming her appointment today. Work note completed and placed up front for patient pick up.  Binnie Kand, RN

## 2021-12-02 NOTE — Progress Notes (Addendum)
[facility]                                      [address], [place], [place], [address]                                               Phn. [phone]; Fax: [phone]                                                               Date:  Reason for Visit: Hepatitis C    [id number] Heidi Bartlett is a 41 y.o.old female with HCV. She was found to have HCV per on routine screening on 04/30/21, referred to ID. VL 2,170,000 on 04/30/21. 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 She moved to [facility] in 2017 for a fresh start. Pt has been incarcerated in the past.  She thinks she contracted HCV from sisiter(needle sharing). Pt thinks she was told she was HCV positive about 9 years ago.  Father passed from liver cirrhosis, etiology unclear.   Today 7/24. PT completed Epclusa x 12 weeks about 2 weeks ago.  Reports no missed doses. Start date 06/03/21-08/25/21  Today 10/16: She has had longerm burning in her feet(x 1 year) now has neuro referral.   Denies any hospitalizations related to liver disease, jaundice, ascites, GI bleeding, mental status changes, abdominal pain and acholic stool.  ROS: Denies yellowish discoloration of sclera and skin, abdominal pain/distension, hematemesis.  Denis cough, fever, chills, nightsweats, nausea, vomiting, diarrhea, constipation, weight loss, recent hospitalizations, rashes, joint complaints, shortness of breath, chest pain, headaches, dysuria .   Current Outpatient Medications on File Prior to Visit  Medication Sig Dispense Refill   cyclobenzaprine (FLEXERIL) 10 MG tablet Take 1 tablet (10 mg total) by mouth at bedtime. 30 tablet 1   gabapentin (NEURONTIN) 100 MG capsule Take 2 capsules (200 mg total) by mouth 3 (three) times daily. 180 capsule 1   hydrOXYzine (VISTARIL) 25 MG capsule TAKE 1 CAPSULE BY MOUTH TWICE DAILY AS NEEDED FOR ANXIETY 180 capsule 0   metFORMIN  (GLUCOPHAGE) 500 MG tablet Take 1 tablet (500 mg total) by mouth daily with breakfast. 30 tablet 11   spironolactone (ALDACTONE) 100 MG tablet Take 1 tablet (100 mg total) by mouth 2 (two) times daily. 60 tablet 11   busPIRone (BUSPAR) 15 MG tablet Take 1 tablet (15 mg total) by mouth 2 (two) times daily. For anxiety (Patient not taking: Reported on 12/02/2021) 60 tablet 1   mupirocin ointment (BACTROBAN) 2 % Apply 1 application. topically 2 (two) times daily. (Patient not taking: Reported on 10/08/2021) 22 g 0   No current facility-administered medications on file prior to visit.   Allergies  Allergen Reactions   Dilaudid [Hydromorphone] Itching   Codeine Hives   Retin-A [Tretinoin] Other (See Comments)    Pt reports burning   Tape Rash    Past Medical History:  Diagnosis Date   Abnormal uterine bleeding (AUB)    Anxiety    Pre-diabetes  Past Surgical History:  Procedure Laterality Date   CESAREAN SECTION  2004   CHOLECYSTECTOMY  2004   LAPAROSCOPIC UNILATERAL SALPINGO OOPHERECTOMY Right 11/15/2019   Procedure: LAPAROSCOPIC RIGHT SALPINGO OOPHORECTOMY AND RIGHT OVARIAN CYSTECTOMY;  Surgeon: Woodroe Mode, MD;  Location: [facility];  Service: Gynecology;  Laterality: Right;    Social History   Socioeconomic History   Marital status: Married    Spouse name: Not on file   Number of children: 0   Years of education: 9th grade   Highest education level: Not on file  Occupational History   Occupation: unemployed  Tobacco Use   Smoking status: Every Day    Packs/day: 0.50    Types: Cigarettes   Smokeless tobacco: Never  Vaping Use   Vaping Use: Never used  Substance and Sexual Activity   Alcohol use: Not Currently   Drug use: Not Currently   Sexual activity: Yes    Birth control/protection: None  Other Topics Concern   Not on file  Social History Narrative   Not on file   Social Determinants of Health   Financial Resource Strain: Not on file   Food Insecurity: No Food Insecurity (07/26/2021)   Hunger Vital Sign    Worried About Running Out of Food in the Last Year: Never true    Ran Out of Food in the Last Year: Never true  Transportation Needs: No Transportation Needs (01/25/2021)   PRAPARE - Hydrologist (Medical): No    Lack of Transportation (Non-Medical): No  Physical Activity: Not on file  Stress: Not on file  Social Connections: Not on file  Intimate Partner Violence: Not on file    Family History  Problem Relation Age of Onset   Breast cancer Mother        92s    Physical exam: BP (!) 133/94   Pulse 96   Temp 98.2 F (36.8 C) (Oral)   Wt 235 lb (106.6 kg)   LMP 11/06/2021 (Exact Date)   SpO2 100%   BMI 35.73 kg/m   Gen: Alert and oriented x 3, no acute distress HEENT: [place]/AT, PERL, EOMI, no scleral icterus, no pale conjunctivae, hearing normal, oral mucosa moist Neck: Supple, no lymphadenopathy Cardio: Regular rate and rhythm; +S1 and S2; no murmurs, gallops, or rubs Resp: CTAB; no wheezes, rhonchi, or rales GI: Soft, nontender, nondistended, bowel sounds present GU: Musc: Extremities: No cyanosis, clubbing, or edema; +2 PT and DP pulses Skin: No rashes, lesions, or ecchymoses Neuro: No focal deficits Psych: Calm, cooperative   Laboratory  CBC w diff CMP PT/PTT/INR Hep A and B serologies  HIV HCV genotype    #Chronic Hepatitis C SP treatment -Completed 12 week  Epclusa weeks(around 7/10). Mavyret was denied -HCV RNA on 5/15 ND, 7/24 ND -Prior treatment: none -GT:1A -Evidence of cirrhosis: none, F1 stage on 05/08/21   Plan: 3 month SVR HAV non immune on 04/18/21-1st dose 05/31/21, 2nd dose today(complete) HBV s AB and AG negative on 05/08/21. 1st dose 05/31/21 , 2nd dose 07/01/21, 3rd dose today 12/02/21(complete) Follow up in 6 months.   - HCC screening with [place] every 6 months - EGD to r/o varices in cirrhotics Smoking-yes(not interested in cessation)/Denies  EtoH use.   Counseling done on the following -Natural progression of hep c, transmission (avoid sharing personal hygiene equipment), prevention, risks of left untreated and treatment options  -Avoid hepatotoxins like alcohol and excessive acetamaminphen (no more than 2 gram a day) -  Avoid eating raw sea food -Risks of re-infection  -Hepatitis coinfection and vaccination( Pneumococcal vaccination in the cirrhotics   HCV RNA Measure of fibrosis NS5A resistance testing in select scenarios    Assessment/Plan: Electronically signed by: I have personally spent 45 minutes involved in face-to-face and non-face-to-face activities for this patient on the day of the visit. Professional time spent includes the following activities: Preparing to see the patient (review of tests), Obtaining and/or reviewing separately obtained history (admission/discharge record), Performing a medically appropriate examination and/or evaluation , Ordering medications/tests/procedures, referring and communicating with other health care professionals, Documenting clinical information in the EMR, Independently interpreting results (not separately reported), Communicating results to the patient/family/caregiver, Counseling and educating the patient/family/caregiver and Care coordination (not separately reported).   Laurice Record, MD Infectious Diseases  Fax no. [phone]

## 2021-12-03 LAB — HEPATITIS C RNA QUANTITATIVE
HCV Quantitative Log: <1.18 log IU/mL
HCV RNA, PCR, QN: <15 IU/mL

## 2021-12-03 LAB — CBC WITH DIFFERENTIAL/PLATELET
Absolute Monocytes: 570 cells/uL (ref 200–950)
Basophils Absolute: 60 cells/uL (ref 0–200)
Basophils Relative: 0.7 %
Eosinophils Absolute: 289 cells/uL (ref 15–500)
Eosinophils Relative: 3.4 %
HCT: 40.8 % (ref 35.0–45.0)
Hemoglobin: 13.9 g/dL (ref 11.7–15.5)
Lymphs Abs: 2533 cells/uL (ref 850–3900)
MCH: 30.2 pg (ref 27.0–33.0)
MCHC: 34.1 g/dL (ref 32.0–36.0)
MCV: 88.5 fL (ref 80.0–100.0)
MPV: 12.6 fL — ABNORMAL HIGH (ref 7.5–12.5)
Monocytes Relative: 6.7 %
Neutro Abs: 5049 cells/uL (ref 1500–7800)
Neutrophils Relative %: 59.4 %
Platelets: 151 10*3/uL (ref 140–400)
RBC: 4.61 10*6/uL (ref 3.80–5.10)
RDW: 12.4 % (ref 11.0–15.0)
Total Lymphocyte: 29.8 %
WBC: 8.5 10*3/uL (ref 3.8–10.8)

## 2021-12-03 LAB — PROTIME-INR
INR: 1.1
Prothrombin Time: 11.1 s (ref 9.0–11.5)

## 2021-12-05 NOTE — Telephone Encounter (Signed)
Please send referral to -  Pt stated her insurance is network with [facility] [phone], [place]  [address]  Main: [phone] Fax: [phone]

## 2021-12-06 ENCOUNTER — Encounter: Payer: Self-pay | Admitting: Internal Medicine

## 2021-12-09 ENCOUNTER — Encounter: Payer: Self-pay | Admitting: Registered"

## 2021-12-09 ENCOUNTER — Encounter: Payer: PRIVATE HEALTH INSURANCE | Attending: Internal Medicine | Admitting: Registered"

## 2021-12-09 DIAGNOSIS — R7303 Prediabetes: Secondary | ICD-10-CM | POA: Diagnosis not present

## 2021-12-09 NOTE — Patient Instructions (Addendum)
To help your energy levels at work aim to drink enough water and eat a balanced breakfast Take your cup to work and get ice and water, aim to drink at least 1 cup full while at work. Switch to whole grain wheat. Eat a balanced breakfast with protein and carbohydrates such as omelette with vegetables and whole grain bread.  For a quick breakfast [place] yogurt would be fine.

## 2021-12-09 NOTE — Progress Notes (Signed)
Medical Nutrition Therapy  Appointment Start time:  1400  Appointment End time:  1440  Primary concerns today: pre-diabetes,   Referral diagnosis: R73.03 prediabetes Preferred learning style: no preference indicated Learning readiness: ready, change in progress  NUTRITION ASSESSMENT  Anthropometrics  Didn't get updated weight, pt reports was 238 at last MD visit Wt Readings from Last 3 Encounters:  12/02/21 235 lb (106.6 kg)  10/08/21 239 lb (108.4 kg)  09/09/21 241 lb (109.3 kg)    Clinical Medical Hx: prediabetes Medications: DM - metformin, gabapentin, sLabs: A1c: 41 yro 5.9%, 41 yo 6.2%, 2 mo ago 6.0% Notable Signs/Symptoms: nail biting, reports hypoglycemic sxs  Lifestyle & Dietary Hx Pt states gabapentin makes her sleepy and MD changed Rx to Lyrica but was not  able to fill Rx of Lyrica.  Pt states she checks blood sugar, but not often, states it is good but doesn't remember the exact number.  Pt reports she has reduced appetite since taking supplement, and states her MD said supplement was okay.   Pt states yesterday she bought a large Starbucks refill container and plans to take to work. Pt states she likes [place] water and plans to get at [facility] if can find in large quantities. Pt states she likes water at her work.  Estimated daily fluid intake: not much water, 10-20 oz soda, Monster drinks, coffee. 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 Stress / self-care: not assessed Current average weekly physical activity: not assessed  24-Hr Dietary Recall   First Meal: small coffee 2 packs sugar 1 tub creamer, bite of peanut butter crisps Snack: none Second Meal: 2 bites of a hamburger, wheat bun, a few fries (has 30 min for lunch) Snack: none Third Meal: (8 pm) 6 piece, chicken nuggets, (eating while driving)  Snack:  Beverages: 1-2 bottles water, 10 oz regular Mtn Dew  occasionally or 12 Sprite, 1 monster per day sometimes sugar-free.   NUTRITION DIAGNOSIS  NI-5.8.2 Excessive carbohydrate intake As related to history of sugar sweetened beverages.  As evidenced by Pt states she eats a lot of potatoes and bread.  NUTRITION INTERVENTION  Nutrition education (E-1) on the following topics:  MyPlate balanced eating Diet and water effect on energy  Handouts Provided Include  Planning Healthy Meals  Ensure Max (milk Chocolate) Exp: 17 Feb 2022; Lot: [id number]  Learning Style & Readiness for Change Teaching method utilized: Visual & Auditory  Demonstrated degree of understanding via: Teach Back  Barriers to learning/adherence to lifestyle change: none  Goals Established by Pt To help your energy levels at work aim to drink enough water and eat a balanced breakfast Take your cup to work and get ice and water, aim to drink at least 1 cup full while at work. Switch to whole grain wheat bread Eat a balanced breakfast with protein and carbohydrates such as omelette with vegetables and whole grain bread.  For a quick breakfast [place] yogurt would be fine.  MONITORING & EVALUATION Dietary intake, weekly physical activit,  in 2 months.

## 2021-12-23 ENCOUNTER — Other Ambulatory Visit: Payer: Self-pay | Admitting: Internal Medicine

## 2021-12-23 DIAGNOSIS — G8929 Other chronic pain: Secondary | ICD-10-CM

## 2021-12-24 NOTE — Telephone Encounter (Signed)
Requested medication (s) are due for refill today - yes  Requested medication (s) are on the active medication list -yes  Future visit scheduled -yes  Last refill: 10/23/21 #30 1RF  Notes to clinic: non delegated Rx  Requested Prescriptions  Pending Prescriptions Disp Refills   cyclobenzaprine (FLEXERIL) 10 MG tablet [Pharmacy Med Name: Cyclobenzaprine HCl 10 MG Oral Tablet] 30 tablet 0    Sig: TAKE 1 TABLET BY MOUTH AT BEDTIME     Not Delegated - Analgesics:  Muscle Relaxants Failed - 12/23/2021  7:09 PM      Failed - This refill cannot be delegated      Passed - Valid encounter within last 6 months    Recent Outpatient Visits           3 weeks ago Need for hepatitis B vaccination   [place], RPH-CPP   2 months ago Essential hypertension   [place], MD   5 months ago Pseudofolliculitis barbae   [place], Vernia Buff, NP   5 months ago Need for hepatitis B vaccination   [place], RPH-CPP   6 months ago Essential hypertension   [place], MD       Future Appointments             In 1 month [place], Dionne Bucy, [place]   In 5 months Laurice Record, MD [facility]               Requested Prescriptions  Pending Prescriptions Disp Refills   cyclobenzaprine (FLEXERIL) 10 MG tablet [Pharmacy Med Name: Cyclobenzaprine HCl 10 MG Oral Tablet] 30 tablet 0    Sig: TAKE 1 TABLET BY MOUTH AT BEDTIME     Not Delegated - Analgesics:  Muscle Relaxants Failed - 12/23/2021  7:09 PM      Failed - This refill cannot be delegated      Passed - Valid encounter within last 6 months    Recent Outpatient Visits           3 weeks ago Need for hepatitis B  vaccination   [place], Jarome Matin, RPH-CPP   2 months ago Essential hypertension   [place], MD   5 months ago Pseudofolliculitis barbae   [place] Gildardo Pounds, NP   5 months ago Need for hepatitis B vaccination   [place], Jarome Matin, RPH-CPP   6 months ago Essential hypertension   [place], Deborah B, MD       Future Appointments             In 1 month [place], Dionne Bucy, PA-C [place]   In 5 months Laurice Record, MD [facility]

## 2022-01-22 ENCOUNTER — Other Ambulatory Visit: Payer: Self-pay | Admitting: Internal Medicine

## 2022-01-22 DIAGNOSIS — E6609 Other obesity due to excess calories: Secondary | ICD-10-CM

## 2022-01-22 DIAGNOSIS — N938 Other specified abnormal uterine and vaginal bleeding: Secondary | ICD-10-CM

## 2022-01-22 DIAGNOSIS — G8929 Other chronic pain: Secondary | ICD-10-CM

## 2022-01-22 NOTE — Telephone Encounter (Signed)
[place] called, spoke with [place], [facility]. Asked if pt had refills on Metformin '500mg'$  04/05/21 #30/11. She states it is out of refills. Advised I would send to provider for review.

## 2022-01-22 NOTE — Telephone Encounter (Signed)
Medication Refill - Medication: metFORMIN (GLUCOPHAGE) 500 MG tablet   Has the patient contacted their pharmacy? No   Preferred Pharmacy (with phone number or street name):  [facility] ([place] - [address] Phone: [phone]  Fax: [phone]     Has the patient been seen for an appointment in the last year OR does the patient have an upcoming appointment? Yes.    The patient has only 1 pill left and needs a refill as soon as possible. Please assist patient further

## 2022-01-22 NOTE — Telephone Encounter (Signed)
Requested medication (s) are due for refill today: yes  Requested medication (s) are on the active medication list: yes  Last refill:  04/05/21 #30/11  Future visit scheduled: yes  Notes to clinic:  Unable to refill per protocol due to failed labs, no updated results. Pt asking for refill prior to appt if possible      Requested Prescriptions  Pending Prescriptions Disp Refills   metFORMIN (GLUCOPHAGE) 500 MG tablet 30 tablet 11    Sig: Take 1 tablet (500 mg total) by mouth daily with breakfast.     Endocrinology:  Diabetes - Biguanides Failed - 01/22/2022  5:14 PM      Failed - HBA1C is between 0 and 7.9 and within 180 days    Hemoglobin A1C  Date Value Ref Range Status  04/30/2021 6.0 (A) 4.0 - 5.6 % Final   Hgb A1c MFr Bld  Date Value Ref Range Status  06/18/2020 6.2 (H) 4.8 - 5.6 % Final    Comment:             Prediabetes: 5.7 - 6.4          Diabetes: >6.4          Glycemic control for adults with diabetes: <7.0          Failed - B12 Level in normal range and within 720 days    No results found for: "VITAMINB12"       Passed - Cr in normal range and within 360 days    Creat  Date Value Ref Range Status  09/09/2021 0.76 0.50 - 0.99 mg/dL Final         Passed - eGFR in normal range and within 360 days    GFR calc Af Amer  Date Value Ref Range Status  11/14/2019 >60 >60 mL/min Final   GFR calc non Af Amer  Date Value Ref Range Status  11/14/2019 >60 >60 mL/min Final   eGFR  Date Value Ref Range Status  09/09/2021 102 > OR = 60 mL/min/1.39m Final    Comment:    The eGFR is based on the CKD-EPI 2021 equation. To calculate  the new eGFR from a previous Creatinine or Cystatin C result, go to https://www.kidney.org/professionals/ kdoqi/gfr%5Fcalculator   04/30/2021 107 >59 mL/min/1.73 Final         Passed - Valid encounter within last 6 months    Recent Outpatient Visits           1 month ago Need for hepatitis B vaccination   CSharon SJarome Matin RPH-CPP   3 months ago Essential hypertension   CGeronimo MD   6 months ago Pseudofolliculitis barbae   [place]ounds [place]   6 months ago Need for hepatitis B vaccination   [place] RPH-CPP   7 months ago Essential hypertension   [place] MD       Future Appointments             In 3 weeks [place]Thereasa SoloACasimer [place]  In 4 months SLaurice Record MD [facility]            Passed - CBC within normal limits and completed in the last 12 months  WBC  Date Value Ref Range Status  12/02/2021 8.5 3.8 - 10.8 Thousand/uL Final   RBC  Date Value Ref Range Status  12/02/2021 4.61 3.80 - 5.10 Million/uL Final   Hemoglobin  Date Value Ref Range Status  12/02/2021 13.9 11.7 - 15.5 g/dL Final  04/30/2021 14.8 11.1 - 15.9 g/dL Final   HCT  Date Value Ref Range Status  12/02/2021 40.8 35.0 - 45.0 % Final   Hematocrit  Date Value Ref Range Status  04/30/2021 43.9 34.0 - 46.6 % Final   MCHC  Date Value Ref Range Status  12/02/2021 34.1 32.0 - 36.0 g/dL Final   [facility]  Date Value Ref Range Status  12/02/2021 30.2 27.0 - 33.0 pg Final   MCV  Date Value Ref Range Status  12/02/2021 88.5 80.0 - 100.0 fL Final  04/30/2021 85 79 - 97 fL Final   No results found for: "PLTCOUNTKUC", "LABPLAT", "POCPLA" RDW  Date Value Ref Range Status  12/02/2021 12.4 11.0 - 15.0 % Final  04/30/2021 12.6 11.7 - 15.4 % Final

## 2022-01-23 MED ORDER — METFORMIN HCL 500 MG PO TABS: 500.0000 mg | ORAL_TABLET | Freq: Every day | ORAL | 0 refills | Status: DC

## 2022-01-23 NOTE — Telephone Encounter (Signed)
Requested medications are due for refill today.  yes  Requested medications are on the active medications list.  yes  Last refill. 12/24/2021 #30 0 rf  Future visit scheduled.   yes  Notes to clinic.  Refill not delegated.    Requested Prescriptions  Pending Prescriptions Disp Refills   cyclobenzaprine (FLEXERIL) 10 MG tablet [Pharmacy Med Name: Cyclobenzaprine HCl 10 MG Oral Tablet] 30 tablet 0    Sig: TAKE 1 TABLET BY MOUTH AT BEDTIME     Not Delegated - Analgesics:  Muscle Relaxants Failed - 01/22/2022  8:34 PM      Failed - This refill cannot be delegated      Passed - Valid encounter within last 6 months    Recent Outpatient Visits           1 month ago Need for hepatitis B vaccination   [place], Jarome Matin, RPH-CPP   3 months ago Essential hypertension   [place], MD   6 months ago Pseudofolliculitis barbae   [place] Gildardo Pounds, NP   6 months ago Need for hepatitis B vaccination   [place], Jarome Matin, RPH-CPP   7 months ago Essential hypertension   [place], Deborah B, MD       Future Appointments             In 2 weeks Thereasa Solo, Dionne Bucy, PA-C [place]   In 4 months Laurice Record, MD [facility]

## 2022-02-03 ENCOUNTER — Ambulatory Visit: Payer: PRIVATE HEALTH INSURANCE | Admitting: Registered"

## 2022-02-05 ENCOUNTER — Other Ambulatory Visit: Payer: Self-pay | Admitting: Internal Medicine

## 2022-02-12 ENCOUNTER — Ambulatory Visit: Payer: PRIVATE HEALTH INSURANCE | Admitting: Physician Assistant

## 2022-02-23 ENCOUNTER — Other Ambulatory Visit: Payer: Self-pay | Admitting: Internal Medicine

## 2022-02-23 DIAGNOSIS — G8929 Other chronic pain: Secondary | ICD-10-CM

## 2022-02-24 NOTE — Telephone Encounter (Signed)
Requested medications are due for refill today.  yes  Requested medications are on the active medications list.  yes  Last refill. 01/23/2022 #30 0 rf  Future visit scheduled.   yes  Notes to clinic.  Refill not delegated.    Requested Prescriptions  Pending Prescriptions Disp Refills   cyclobenzaprine (FLEXERIL) 10 MG tablet [Pharmacy Med Name: Cyclobenzaprine HCl 10 MG Oral Tablet] 30 tablet 0    Sig: TAKE 1 TABLET BY MOUTH AT BEDTIME     Not Delegated - Analgesics:  Muscle Relaxants Failed - 02/23/2022  7:59 PM      Failed - This refill cannot be delegated      Passed - Valid encounter within last 6 months    Recent Outpatient Visits           2 months ago Need for hepatitis B vaccination   [place], Jarome Matin, RPH-CPP   4 months ago Essential hypertension   [place], MD   7 months ago Pseudofolliculitis barbae   [place] Gildardo Pounds, NP   7 months ago Need for hepatitis B vaccination   [place], Jarome Matin, RPH-CPP   8 months ago Essential hypertension   [place], Deborah B, MD       Future Appointments             In 2 weeks Thereasa Solo, Dionne Bucy, PA-C [place]   In 3 months Laurice Record, MD [facility]

## 2022-02-25 ENCOUNTER — Other Ambulatory Visit: Payer: Self-pay

## 2022-02-25 ENCOUNTER — Emergency Department (HOSPITAL_BASED_OUTPATIENT_CLINIC_OR_DEPARTMENT_OTHER)
Admission: EM | Admit: 2022-02-25 | Discharge: 2022-02-25 | Disposition: A | Discharge status: Home / Self Care | Payer: Medicaid Other | Attending: Emergency Medicine | Admitting: Emergency Medicine

## 2022-02-25 ENCOUNTER — Encounter (HOSPITAL_BASED_OUTPATIENT_CLINIC_OR_DEPARTMENT_OTHER): Payer: Self-pay | Admitting: Emergency Medicine

## 2022-02-25 DIAGNOSIS — R7303 Prediabetes: Secondary | ICD-10-CM | POA: Diagnosis not present

## 2022-02-25 DIAGNOSIS — M791 Myalgia, unspecified site: Secondary | ICD-10-CM | POA: Insufficient documentation

## 2022-02-25 DIAGNOSIS — I10 Essential (primary) hypertension: Secondary | ICD-10-CM | POA: Diagnosis not present

## 2022-02-25 DIAGNOSIS — Z1152 Encounter for screening for COVID-19: Secondary | ICD-10-CM | POA: Diagnosis not present

## 2022-02-25 DIAGNOSIS — Z79899 Other long term (current) drug therapy: Secondary | ICD-10-CM | POA: Insufficient documentation

## 2022-02-25 DIAGNOSIS — Z7984 Long term (current) use of oral hypoglycemic drugs: Secondary | ICD-10-CM | POA: Insufficient documentation

## 2022-02-25 DIAGNOSIS — R52 Pain, unspecified: Secondary | ICD-10-CM

## 2022-02-25 DIAGNOSIS — R1110 Vomiting, unspecified: Secondary | ICD-10-CM | POA: Insufficient documentation

## 2022-02-25 LAB — RESP PANEL BY RT-PCR (RSV, FLU A&B, COVID)  RVPGX2
Influenza A by PCR: NEGATIVE
Influenza B by PCR: NEGATIVE
Resp Syncytial Virus by PCR: NEGATIVE
SARS Coronavirus 2 by RT PCR: NEGATIVE

## 2022-02-25 MED ORDER — ONDANSETRON 4 MG PO TBDP
4.0000 mg | ORAL_TABLET | Freq: Once | ORAL | Status: AC
  Administered 2022-02-25: 4 mg via ORAL
  Filled 2022-02-25: qty 1

## 2022-02-25 MED ORDER — ONDANSETRON HCL 4 MG PO TABS: 4.0000 mg | ORAL_TABLET | Freq: Four times a day (QID) | ORAL | 0 refills | Status: AC

## 2022-02-25 NOTE — ED Notes (Signed)
RN provided AVS using Teachback Method. Patient verbalizes understanding of Discharge Instructions. Opportunity for Questioning and Answers were provided by RN. Patient Discharged from ED ambulatory to home via Self.

## 2022-02-25 NOTE — ED Provider Notes (Signed)
[place] EMERGENCY DEPT Provider Note   CSN: [id number] Arrival date & time: 02/25/22  1003     History  Chief Complaint  Patient presents with   Generalized Body Aches    Heidi Bartlett is a 42 y.o. female. The patient presents to the emergency department complaining of generalized body aches that started yesterday. The patient states that she initially had some mild stomach discomfort followed by one episode of emesis. Since that time she has had body aches. She denies shortness of breath, cough, fever, chest pain, urinary symptoms, vaginal discharge, abdominal pain, diarrhea. Past medical history significant for prediabetes on metformin, hypertension, anxiety, obesity  HPI     Home Medications Prior to Admission medications   Medication Sig Start Date End Date Taking? Authorizing Provider  ondansetron (ZOFRAN) 4 MG tablet Take 1 tablet (4 mg total) by mouth every 6 (six) hours. 02/25/22  Yes Dorothyann Peng, PA-C  busPIRone (BUSPAR) 15 MG tablet Take 1 tablet (15 mg total) by mouth 2 (two) times daily. For anxiety Patient not taking: Reported on 12/02/2021 07/26/21   Gildardo Pounds, NP  cyclobenzaprine (FLEXERIL) 10 MG tablet TAKE 1 TABLET BY MOUTH AT BEDTIME 02/24/22   Ladell Pier, MD  gabapentin (NEURONTIN) 100 MG capsule Take 2 capsules (200 mg total) by mouth 3 (three) times daily. 11/07/21   Mayers, Cari S, PA-C  hydrOXYzine (VISTARIL) 25 MG capsule TAKE 1 CAPSULE BY MOUTH TWICE DAILY AS NEEDED FOR ANXIETY 02/05/22   Ladell Pier, MD  metFORMIN (GLUCOPHAGE) 500 MG tablet Take 1 tablet (500 mg total) by mouth daily with breakfast. 01/23/22   Ladell Pier, MD  mupirocin ointment (BACTROBAN) 2 % Apply 1 application. topically 2 (two) times daily. Patient not taking: Reported on 10/08/2021 04/30/21   Mayers, Cari S, PA-C  pregabalin (LYRICA) 50 MG capsule Take 50 mg by mouth 2 (two) times daily. 02/11/22   [provider]   spironolactone (ALDACTONE) 100 MG tablet Take 1 tablet (100 mg total) by mouth 2 (two) times daily. 04/05/21   Caren Macadam, MD      Allergies    Dilaudid [hydromorphone], Codeine, Retin-a [tretinoin], and Tape    Review of Systems   Review of Systems  Constitutional:  Negative for fever.  HENT:  Negative for congestion.   Respiratory:  Negative for cough and shortness of breath.   Cardiovascular:  Negative for chest pain.  Gastrointestinal:  Positive for abdominal pain (Resolved) and vomiting (One episode). Negative for constipation, diarrhea and nausea.  Genitourinary:  Negative for dysuria.  Musculoskeletal:  Positive for myalgias.    Physical Exam Updated Vital Signs BP 116/83 (BP Location: Right Arm)   Pulse (!) 101   Temp 98.8 F (37.1 C) (Oral)   Resp 16   Ht '5\' 8"'$  (1.727 m)   Wt 101.8 kg   SpO2 97%   BMI 34.12 kg/m  Physical Exam Vitals and nursing note reviewed.  Constitutional:      General: She is not in acute distress.    Appearance: She is well-developed.  HENT:     Head: Normocephalic and atraumatic.     Nose: Nose normal.     Mouth/Throat:     Mouth: Mucous membranes are moist.  Eyes:     Conjunctiva/sclera: Conjunctivae normal.  Cardiovascular:     Rate and Rhythm: Normal rate and regular rhythm.     Heart sounds: No murmur heard. Pulmonary:     Effort: Pulmonary effort  is normal. No respiratory distress.     Breath sounds: Normal breath sounds.  Abdominal:     Palpations: Abdomen is soft.     Tenderness: There is no abdominal tenderness.  Musculoskeletal:        General: No swelling.     Cervical back: Neck supple.  Skin:    General: Skin is warm and dry.     Capillary Refill: Capillary refill takes less than 2 seconds.  Neurological:     Mental Status: She is alert.  Psychiatric:        Mood and Affect: Mood normal.     ED Results / Procedures / Treatments   Labs (all labs ordered are listed, but only abnormal results are  displayed) Labs Reviewed  RESP PANEL BY RT-PCR (RSV, FLU A&B, COVID)  [id number]    EKG None  Radiology No results found.  Procedures Procedures    Medications Ordered in ED Medications  ondansetron (ZOFRAN-ODT) disintegrating tablet 4 mg (4 mg Oral Given 02/25/22 1110)    ED Course/ Medical Decision Making/ A&P                           Medical Decision Making  This patient presents to the ED for concern of body aches, this involves an extensive number of treatment options, and is a complaint that carries with it a high risk of complications and morbidity.  The differential diagnosis includes Covid 19, influenza, other viral illnesses, and others   Co morbidities that complicate the patient evaluation  Hypertension, prediabetes   Additional history obtained:   External records from outside source obtained and reviewed including notes from neurology from November due to patient's underlying polyneuropathy   Lab Tests:  I Ordered, and personally interpreted labs.  The pertinent results include: Negative respiratory panel for flu, RSV, COVID   Imaging Studies ordered:  The patient's lungs are clear to auscultation bilaterally. There is no indication for chest imaging at this time.    Problem List / ED Course / Critical interventions / Medication management   I ordered medication including zofran for nausea  Reevaluation of the patient after these medicines showed that the patient improved I have reviewed the patients home medicines and have made adjustments as needed   Test / Admission - Considered:  The patient has normal vital signs. Respiratory testing was negative for RSV, Covid, Influenza. The patient's symptoms are still consistent with a viral illness. Plan to discharge home with instructions on supportive care with return precautions.          Final Clinical Impression(s) / ED Diagnoses Final diagnoses:  Generalized body aches    Rx / DC  Orders ED Discharge Orders          Ordered    ondansetron (ZOFRAN) 4 MG tablet  Every 6 hours        02/25/22 1112              Ronny Bacon 02/25/22 1117    Isla Pence, MD 02/25/22 1129

## 2022-02-25 NOTE — Discharge Instructions (Addendum)
You were seen today for evaluation of body aches. Your workup was negative for Covid 19, influenza A and B, and RSV. Your symptoms are consistent with a viral illness. Be sure to drink plenty of water and rest. If you develop shortness of breath or other life threatening conditions please return to the emergency department.  Zofran was prescribed for nausea. Use as directed.

## 2022-02-25 NOTE — ED Triage Notes (Signed)
Chills body aches since yesterday has nt taken any meds , vomited yesterday not today

## 2022-03-10 ENCOUNTER — Encounter: Payer: PRIVATE HEALTH INSURANCE | Admitting: Registered"

## 2022-03-12 ENCOUNTER — Encounter: Payer: Self-pay | Admitting: Physician Assistant

## 2022-03-12 ENCOUNTER — Ambulatory Visit: Payer: PRIVATE HEALTH INSURANCE | Attending: Physician Assistant | Admitting: Physician Assistant

## 2022-03-12 VITALS — BP 117/74 | HR 93 | Ht 68.0 in | Wt 224.8 lb

## 2022-03-12 DIAGNOSIS — I10 Essential (primary) hypertension: Secondary | ICD-10-CM

## 2022-03-12 DIAGNOSIS — R7303 Prediabetes: Secondary | ICD-10-CM

## 2022-03-12 DIAGNOSIS — G8929 Other chronic pain: Secondary | ICD-10-CM

## 2022-03-12 DIAGNOSIS — M5441 Lumbago with sciatica, right side: Secondary | ICD-10-CM

## 2022-03-12 DIAGNOSIS — N926 Irregular menstruation, unspecified: Secondary | ICD-10-CM | POA: Diagnosis not present

## 2022-03-12 DIAGNOSIS — Z6839 Body mass index (BMI) 39.0-39.9, adult: Secondary | ICD-10-CM

## 2022-03-12 DIAGNOSIS — M5442 Lumbago with sciatica, left side: Secondary | ICD-10-CM

## 2022-03-12 DIAGNOSIS — N938 Other specified abnormal uterine and vaginal bleeding: Secondary | ICD-10-CM

## 2022-03-12 DIAGNOSIS — G629 Polyneuropathy, unspecified: Secondary | ICD-10-CM

## 2022-03-12 DIAGNOSIS — E6609 Other obesity due to excess calories: Secondary | ICD-10-CM | POA: Diagnosis not present

## 2022-03-12 DIAGNOSIS — F419 Anxiety disorder, unspecified: Secondary | ICD-10-CM

## 2022-03-12 MED ORDER — METFORMIN HCL 500 MG PO TABS: 500.0000 mg | ORAL_TABLET | Freq: Every day | ORAL | 1 refills | Status: DC

## 2022-03-12 MED ORDER — CYCLOBENZAPRINE HCL 10 MG PO TABS: 10.0000 mg | ORAL_TABLET | Freq: Every day | ORAL | 2 refills | Status: DC

## 2022-03-12 MED ORDER — SPIRONOLACTONE 100 MG PO TABS: 100.0000 mg | ORAL_TABLET | Freq: Two times a day (BID) | ORAL | 11 refills | Status: DC

## 2022-03-12 MED ORDER — HYDROXYZINE PAMOATE 25 MG PO CAPS: 25.0000 mg | ORAL_CAPSULE | Freq: Two times a day (BID) | ORAL | 1 refills | Status: DC | PRN

## 2022-03-12 MED ORDER — GABAPENTIN 100 MG PO CAPS: 200.0000 mg | ORAL_CAPSULE | Freq: Three times a day (TID) | ORAL | 1 refills | Status: DC

## 2022-03-12 NOTE — Progress Notes (Signed)
Patient ID: Heidi Bartlett, female   DOB: Jan 30, 1981, 42 y.o.   MRN: [id number]   Heidi Bartlett, is a 42 y.o. female  JGG:[id number]  LYY:[id number]  DOB - February 11, 1981  Chief Complaint  Patient presents with   Medication Refill   Menorrhagia       Subjective:   Heidi Bartlett is a 42 y.o. female here today for RF on gabapentin, flexeril, and hydroxyzine.  She was taking lyrica but feels the gabapentin works better.    Her periods have been irregular since having her R ovary removed about 1 year ago.  She just started her most recent period 2 days ago and it is very heavy.  She is about to schedule an appt with gyn No problems updated.  ALLERGIES: Allergies  Allergen Reactions   Dilaudid [Hydromorphone] Itching   Codeine Hives   Retin-A [Tretinoin] Other (See Comments)    Pt reports burning   Tape Rash    PAST MEDICAL HISTORY: Past Medical History:  Diagnosis Date   Abnormal uterine bleeding (AUB)    Anxiety    Pre-diabetes     MEDICATIONS AT HOME: Prior to Admission medications   Medication Sig Start Date End Date Taking? Authorizing Provider  mupirocin ointment (BACTROBAN) 2 % Apply 1 application. topically 2 (two) times daily. 04/30/21  Yes Mayers, Cari S, PA-C  ondansetron (ZOFRAN) 4 MG tablet Take 1 tablet (4 mg total) by mouth every 6 (six) hours. 02/25/22  Yes Dorothyann Peng, PA-C  busPIRone (BUSPAR) 15 MG tablet Take 1 tablet (15 mg total) by mouth 2 (two) times daily. For anxiety Patient not taking: Reported on 12/02/2021 07/26/21   Gildardo Pounds, NP  cyclobenzaprine (FLEXERIL) 10 MG tablet Take 1 tablet (10 mg total) by mouth at bedtime. prn 03/12/22   Ar[place]ina Donovan, PA-C  gabapentin (NEURONTIN) 100 MG capsule Take 2 capsules (200 mg total) by mouth 3 (three) times daily. 03/12/22   Argentina Donovan, PA-C  hydrOXYzine (VISTARIL) 25 MG capsule Take 1 capsule (25 mg total) by mouth 2 (two) times daily as needed. 03/12/22   Argentina Donovan, PA-C  metFORMIN  (GLUCOPHAGE) 500 MG tablet Take 1 tablet (500 mg total) by mouth daily with breakfast. 03/12/22   Argentina Donovan, PA-C  spironolactone (ALDACTONE) 100 MG tablet Take 1 tablet (100 mg total) by mouth 2 (two) times daily. 03/12/22   Zarian Colpitts, Dionne Bucy, PA-C    ROS: Neg HEENT Neg resp Neg cardiac Neg GI Neg GU Neg MS Neg psych Neg neuro  Objective:   Vitals:   03/12/22 0955  BP: 117/74  Pulse: 93  SpO2: 98%  Weight: 224 lb 12.8 oz (102 kg)  Height: '5\' 8"'$  (1.727 m)   Exam General appearance : Awake, alert, not in any distress. Speech Clear. Not toxic looking.  Needed redirection HEENT: Atraumatic and Normocephalic Neck: Supple, no JVD. No cervical lymphadenopathy.  Chest: Good air entry bilaterally, CTAB.  No rales/rhonchi/wheezing CVS: S1 S2 regular, no murmurs.  Extremities: B/L Lower Ext shows no edema, both legs are warm to touch Neurology: Awake alert, and oriented X 3, CN II-XII intact, Non focal Skin: No Rash  Data Review Lab Results  Component Value Date   HGBA1C 6.0 (A) 04/30/2021   HGBA1C 6.2 (H) 06/18/2020   HGBA1C 5.9 (H) 05/03/2019    Assessment & Plan   1. Irregular periods See gyn - TSH - CBC with Differential/Platelet  2. Dysfunctional uterine bleeding See gyn - metFORMIN (GLUCOPHAGE) 500  MG tablet; Take 1 tablet (500 mg total) by mouth daily with breakfast.  Dispense: 90 tablet; Refill: 1 - spironolactone (ALDACTONE) 100 MG tablet; Take 1 tablet (100 mg total) by mouth 2 (two) times daily.  Dispense: 60 tablet; Refill: 11 - CBC with Differential/Platelet  3. Class 2 obesity due to excess calories without serious comorbidity with body mass index (BMI) of 39.0 to 39.9 in adult - metFORMIN (GLUCOPHAGE) 500 MG tablet; Take 1 tablet (500 mg total) by mouth daily with breakfast.  Dispense: 90 tablet; Refill: 1 - spironolactone (ALDACTONE) 100 MG tablet; Take 1 tablet (100 mg total) by mouth 2 (two) times daily.  Dispense: 60 tablet; Refill: 11  4.  Chronic bilateral low back pain with bilateral sciatica - cyclobenzaprine (FLEXERIL) 10 MG tablet; Take 1 tablet (10 mg total) by mouth at bedtime. prn  Dispense: 30 tablet; Refill: 2  5. Essential hypertension Controlled on spironolactone - Basic metabolic panel  6. Neuropathy - gabapentin (NEURONTIN) 100 MG capsule; Take 2 capsules (200 mg total) by mouth 3 (three) times daily.  Dispense: 180 capsule; Refill: 1  7. Prediabetes I have had a lengthy discussion and provided education about insulin resistance and the intake of too much sugar/refined carbohydrates.  I have advised the patient to work at a goal of eliminating sugary drinks, candy, desserts, sweets, refined sugars, processed foods, and white carbohydrates.  The patient expresses understanding.  -A1C - metFORMIN (GLUCOPHAGE) 500 MG tablet; Take 1 tablet (500 mg total) by mouth daily with breakfast.  Dispense: 90 tablet; Refill: 1  8. Anxiety Helps with itching - hydrOXYzine (VISTARIL) 25 MG capsule; Take 1 capsule (25 mg total) by mouth 2 (two) times daily as needed.  Dispense: 60 capsule; Refill: 1    Return for 4 to 5 months with PCP Wynetta Emery is listed).  The patient was given clear instructions to go to ER or return to medical center if symptoms don't improve, worsen or new problems develop. The patient verbalized understanding. The patient was told to call to get lab results if they haven't heard anything in the next week.      Freeman Caldron, PA-C [facility] [place], Veteran   03/12/2022, 10:08 AM

## 2022-03-13 LAB — CBC WITH DIFFERENTIAL/PLATELET
Basophils Absolute: 0.1 10*3/uL (ref 0.0–0.2)
Basos: 1 %
EOS (ABSOLUTE): 0.3 10*3/uL (ref 0.0–0.4)
Eos: 3 %
Hematocrit: 40.8 % (ref 34.0–46.6)
Hemoglobin: 13.4 g/dL (ref 11.1–15.9)
Immature Grans (Abs): 0 10*3/uL (ref 0.0–0.1)
Immature Granulocytes: 0 %
Lymphocytes Absolute: 2.5 10*3/uL (ref 0.7–3.1)
Lymphs: 30 %
MCH: 29.1 pg (ref 26.6–33.0)
MCHC: 32.8 g/dL (ref 31.5–35.7)
MCV: 89 fL (ref 79–97)
Monocytes Absolute: 0.6 10*3/uL (ref 0.1–0.9)
Monocytes: 8 %
Neutrophils Absolute: 4.8 10*3/uL (ref 1.4–7.0)
Neutrophils: 58 %
Platelets: 158 10*3/uL (ref 150–450)
RBC: 4.6 x10E6/uL (ref 3.77–5.28)
RDW: 12.6 % (ref 11.7–15.4)
WBC: 8.3 10*3/uL (ref 3.4–10.8)

## 2022-03-13 LAB — TSH: TSH: 1.41 u[IU]/mL (ref 0.450–4.500)

## 2022-03-13 LAB — HEMOGLOBIN A1C
Est. average glucose Bld gHb Est-mCnc: 117 mg/dL
Hgb A1c MFr Bld: 5.7 % — ABNORMAL HIGH (ref 4.8–5.6)

## 2022-03-13 LAB — BASIC METABOLIC PANEL
BUN/Creatinine Ratio: 15 (ref 9–23)
BUN: 11 mg/dL (ref 6–24)
CO2: 22 mmol/L (ref 20–29)
Calcium: 9.1 mg/dL (ref 8.7–10.2)
Chloride: 106 mmol/L (ref 96–106)
Creatinine, Ser: 0.71 mg/dL (ref 0.57–1.00)
Glucose: 95 mg/dL (ref 70–99)
Potassium: 4.3 mmol/L (ref 3.5–5.2)
Sodium: 145 mmol/L — ABNORMAL HIGH (ref 134–144)
eGFR: 109 mL/min/{1.73_m2} (ref >59)

## 2022-05-10 ENCOUNTER — Other Ambulatory Visit: Payer: Self-pay | Admitting: Physician Assistant

## 2022-05-10 DIAGNOSIS — G629 Polyneuropathy, unspecified: Secondary | ICD-10-CM

## 2022-05-12 NOTE — Telephone Encounter (Signed)
Requested Prescriptions  Pending Prescriptions Disp Refills   gabapentin (NEURONTIN) 100 MG capsule [Pharmacy Med Name: Gabapentin 100 MG Oral Capsule] 180 capsule 2    Sig: TAKE 2 CAPSULES BY MOUTH THREE TIMES DAILY     Neurology: Anticonvulsants - gabapentin Passed - 05/10/2022  6:52 AM      Passed - Cr in normal range and within 360 days    Creat  Date Value Ref Range Status  09/09/2021 0.76 0.50 - 0.99 mg/dL Final   Creatinine, Ser  Date Value Ref Range Status  03/12/2022 0.71 0.57 - 1.00 mg/dL Final         Passed - Completed PHQ-2 or PHQ-9 in the last 360 days      Passed - Valid encounter within last 12 months    Recent Outpatient Visits           2 months ago Irregular periods   [place], [place]   5 months ago Need for hepatitis B vaccination   [place], Jarome Matin, RPH-CPP   7 months ago Essential hypertension   [place], MD   9 months ago Pseudofolliculitis barbae   [place] Gildardo Pounds, NP   10 months ago Need for hepatitis B vaccination   [place], Jarome Matin, RPH-CPP       Future Appointments             In 3 weeks Laurice Record, MD [facility]   In 2 months Ladell Pier, MD [place]

## 2022-06-03 ENCOUNTER — Ambulatory Visit: Payer: Self-pay | Admitting: Internal Medicine

## 2022-06-03 NOTE — Progress Notes (Deleted)
Patient Active Problem List   Diagnosis Date Noted   Chronic hepatitis C without hepatic coma 05/31/2021   Mixed hyperlipidemia 05/02/2021   Elevated liver enzymes 05/02/2021   Essential hypertension 09/14/2020   Prediabetes 01/03/2020   Obesity (BMI 30-39.9) 01/03/2020   Alopecia 01/03/2020   Right ovarian cyst 10/17/2019   Dysfunctional uterine bleeding 07/02/2018   Sciatica of right side 11/24/2017   Class 2 severe obesity due to excess calories with serious comorbidity and body mass index (BMI) of 37.0 to 37.9 in adult 11/24/2017   Tobacco dependence 11/24/2017    Patient's Medications  New Prescriptions   No medications on file  Previous Medications   BUSPIRONE (BUSPAR) 15 MG TABLET    Take 1 tablet (15 mg total) by mouth 2 (two) times daily. For anxiety   CYCLOBENZAPRINE (FLEXERIL) 10 MG TABLET    Take 1 tablet (10 mg total) by mouth at bedtime. prn   GABAPENTIN (NEURONTIN) 100 MG CAPSULE    TAKE 2 CAPSULES BY MOUTH THREE TIMES DAILY   HYDROXYZINE (VISTARIL) 25 MG CAPSULE    Take 1 capsule (25 mg total) by mouth 2 (two) times daily as needed.   METFORMIN (GLUCOPHAGE) 500 MG TABLET    Take 1 tablet (500 mg total) by mouth daily with breakfast.   MUPIROCIN OINTMENT (BACTROBAN) 2 %    Apply 1 application. topically 2 (two) times daily.   ONDANSETRON (ZOFRAN) 4 MG TABLET    Take 1 tablet (4 mg total) by mouth every 6 (six) hours.   SPIRONOLACTONE (ALDACTONE) 100 MG TABLET    Take 1 tablet (100 mg total) by mouth 2 (two) times daily.  Modified Medications   No medications on file  Discontinued Medications   No medications on file    Subjective: Heidi Bartlett is a 42 y.o.old female with HCV. She was found to have HCV per on routine screening on 04/30/21, referred to ID. VL 2,170,000 on 04/30/21. 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 She moved to [facility] in 2017 for a fresh start. Pt has been incarcerated in the past.  She thinks she  contracted HCV from sisiter(needle sharing). Pt thinks she was told she was HCV positive about 9 years ago.  Father passed from liver cirrhosis, etiology unclear.   Today 7/24. PT completed Epclusa x 12 weeks about 2 weeks ago.  Reports no missed doses. Start date 06/03/21-08/25/21  Today 10/16: She has had longerm burning in her feet(x 1 year) now has neuro referral.    Denies any hospitalizations related to liver disease, jaundice, ascites, GI bleeding, mental status changes, abdominal pain and acholic stool.    Review of Systems: ROS  Past Medical History:  Diagnosis Date   Abnormal uterine bleeding (AUB)    Anxiety    Pre-diabetes     Social History   Tobacco Use   Smoking status: Every Day    Packs/day: .5    Types: Cigarettes   Smokeless tobacco: Never  Vaping Use   Vaping Use: Never used  Substance Use Topics   Alcohol use: Not Currently   Drug use: Not Currently    Family History  Problem Relation Age of Onset   Breast cancer Mother        30s    Allergies  Allergen Reactions   Dilaudid [Hydromorphone] Itching   Codeine Hives   Retin-A [Tretinoin] Other (See Comments)    Pt reports  burning   Tape Rash    Health Maintenance  Topic Date Due   COVID-19 Vaccine (3 - Pfizer risk series) 06/20/2019   PAP SMEAR-Modifier  05/03/2022   INFLUENZA VACCINE  09/18/2022   DTaP/Tdap/Td (2 - Td or Tdap) 11/25/2027   Hepatitis C Screening  Completed   HIV Screening  Completed   HPV VACCINES  Aged Out    Objective:  There were no vitals filed for this visit. There is no height or weight on file to calculate BMI.  Physical Exam  Lab Results Lab Results  Component Value Date   WBC 8.3 03/12/2022   HGB 13.4 03/12/2022   HCT 40.8 03/12/2022   MCV 89 03/12/2022   PLT 158 03/12/2022    Lab Results  Component Value Date   CREATININE 0.71 03/12/2022   BUN 11 03/12/2022   NA 145 (H) 03/12/2022   K 4.3 03/12/2022   CL 106 03/12/2022   CO2 22 03/12/2022     Lab Results  Component Value Date   ALT 27 09/09/2021   AST 22 09/09/2021   GGT 69 (H) 05/08/2021   ALKPHOS 67 04/30/2021   BILITOT 0.4 09/09/2021    Lab Results  Component Value Date   CHOL 236 (H) 04/30/2021   HDL 48 04/30/2021   LDLCALC 159 (H) 04/30/2021   TRIG 159 (H) 04/30/2021   No results found for: "LABRPR", "RPRTITER" No results found for: "HIV1RNAQUANT", "HIV1RNAVL", "CD4TABS"   Problem List Items Addressed This Visit   None  Assessment/Plan #Chronic Hepatitis C SP treatment -Completed 12 week  Epclusa weeks(around 7/10). Mavyret was denied -HCV RNA on 5/15 ND, 7/24 ND -Prior treatment: none -GT:1A -Evidence of cirrhosis: none, F1 stage on 05/08/21   Plan: 3 month SVR HAV non immune on 04/18/21-1st dose 05/31/21, 2nd dose 12/02/21(complete) HBV s AB and AG negative on 05/08/21. 1st dose 05/31/21 , 2nd dose 07/01/21, 3rd dose t 12/02/21(complete) Follow up in 6 months.      Smoking-yes(not interested in cessation)/Denies EtoH use.   Counseling done on the following -Natural progression of hep c, transmission (avoid sharing personal hygiene equipment), prevention, risks of left untreated and treatment options  -Avoid hepatotoxins like alcohol and excessive acetamaminphen (no more than 2 gram a day) -Avoid eating raw sea food -Risks of re-infection  -Hepatitis coinfection and vaccination( Pneumococcal vaccination in the cirrhotics    Danelle Earthly, MD [facility] 06/03/2022, 6:04 AM

## 2022-07-08 ENCOUNTER — Other Ambulatory Visit: Payer: Self-pay | Admitting: Physician Assistant

## 2022-07-08 DIAGNOSIS — F419 Anxiety disorder, unspecified: Secondary | ICD-10-CM

## 2022-07-11 ENCOUNTER — Ambulatory Visit: Payer: Self-pay | Attending: Internal Medicine | Admitting: Internal Medicine

## 2022-07-11 ENCOUNTER — Encounter: Payer: Self-pay | Admitting: Internal Medicine

## 2022-07-11 VITALS — BP 117/79 | HR 80 | Temp 98.3°F | Ht 68.0 in | Wt 214.0 lb

## 2022-07-11 DIAGNOSIS — G629 Polyneuropathy, unspecified: Secondary | ICD-10-CM

## 2022-07-11 DIAGNOSIS — F320 Major depressive disorder, single episode, mild: Secondary | ICD-10-CM

## 2022-07-11 DIAGNOSIS — I10 Essential (primary) hypertension: Secondary | ICD-10-CM

## 2022-07-11 DIAGNOSIS — F419 Anxiety disorder, unspecified: Secondary | ICD-10-CM

## 2022-07-11 DIAGNOSIS — R0981 Nasal congestion: Secondary | ICD-10-CM

## 2022-07-11 DIAGNOSIS — E669 Obesity, unspecified: Secondary | ICD-10-CM

## 2022-07-11 DIAGNOSIS — E66811 Obesity, class 1: Secondary | ICD-10-CM

## 2022-07-11 MED ORDER — SPIRONOLACTONE 100 MG PO TABS: 100.0000 mg | ORAL_TABLET | Freq: Every day | ORAL | 1 refills | Status: DC

## 2022-07-11 MED ORDER — FLUTICASONE PROPIONATE 50 MCG/ACT NA SUSP: 1.0000 | Freq: Every day | NASAL | 1 refills | Status: AC | PRN

## 2022-07-11 MED ORDER — DULOXETINE HCL 20 MG PO CPEP: 20.0000 mg | ORAL_CAPSULE | Freq: Every day | ORAL | 3 refills | Status: DC

## 2022-07-11 MED ORDER — HYDROXYZINE PAMOATE 25 MG PO CAPS: 25.0000 mg | ORAL_CAPSULE | Freq: Every day | ORAL | 1 refills | Status: DC | PRN

## 2022-07-11 NOTE — Progress Notes (Signed)
Patient ID: Heidi Bartlett, female    DOB: 1980/04/22  MRN: [id number]  CC: Hypertension (HTN f/u. /Pt reports not feeling well on buspar. /Burning sensation on feet, hips due to gabapentin per pt X2-3 mo/Poss sinus infection, sore throat, pressure in sinus x1 day)   Subjective: Heidi Bartlett is a 42 y.o. female who presents for chronic ds management Her concerns today include:  History of HTN, obesity, hep C treated,  tob dep, chronic right-sided lower back pain, DUB/PCOS, preDM.    Obesity/PreDM:  down 10 lbs since 02/2022.  Exercising more - walking 3x/wk and does yoga every morning.  Also taking an OTC supplement  for wgh loss called Lipozene BID Taking Metformin 500 mg daily  Still has burning sensation on feet for quite a while.  Worse at nights.  Has to get up and walk.  Seen by [facility] neurology in the fall of last year.  Diagnosed with small fiber neuropathy.  Had EMG study done but I am unable to pull up the results on Care Everywhere.  HTN:  on Spironolactone 100 mg once a day.  Mistakenly written by PA for BID 02/2022.    Anxiety:  does not like the way Buspar makes her feel depress.  Stopped taking it 3 mths ago.  On Hydroxyzine which she states helped anxiety in the beginning but not now.  Gets some itching at times and it helps for that.  Feels she is depressed also.  No SI/HI. -feels her anx got worse when she moved here from [place] in 2017.  Had it in [place] but never talked about it.    Thinks she has sinus infection. Woke this a.m with  sore throat this a.m.  Feels congestion LT nostril.  No fever, sneezing or coughing Patient Active Problem List   Diagnosis Date Noted   Chronic hepatitis C without hepatic coma (HCC) 05/31/2021   Mixed hyperlipidemia 05/02/2021   Elevated liver enzymes 05/02/2021   Essential hypertension 09/14/2020   Prediabetes 01/03/2020   Obesity (BMI 30-39.9) 01/03/2020   Alopecia 01/03/2020   Right ovarian cyst 10/17/2019    Dysfunctional uterine bleeding 07/02/2018   Sciatica of right side 11/24/2017   Class 2 severe obesity due to excess calories with serious comorbidity and body mass index (BMI) of 37.0 to 37.9 in adult [facility]) 11/24/2017   Tobacco dependence 11/24/2017     Current Outpatient Medications on File Prior to Visit  Medication Sig Dispense Refill   cyclobenzaprine (FLEXERIL) 10 MG tablet Take 1 tablet (10 mg total) by mouth at bedtime. prn 30 tablet 2   gabapentin (NEURONTIN) 100 MG capsule TAKE 2 CAPSULES BY MOUTH THREE TIMES DAILY 180 capsule 2   hydrOXYzine (VISTARIL) 25 MG capsule TAKE 1 CAPSULE BY MOUTH TWICE DAILY AS NEEDED 60 capsule 0   metFORMIN (GLUCOPHAGE) 500 MG tablet Take 1 tablet (500 mg total) by mouth daily with breakfast. 90 tablet 1   mupirocin ointment (BACTROBAN) 2 % Apply 1 application. topically 2 (two) times daily. 22 g 0   spironolactone (ALDACTONE) 100 MG tablet Take 1 tablet (100 mg total) by mouth 2 (two) times daily. 60 tablet 11   busPIRone (BUSPAR) 15 MG tablet Take 1 tablet (15 mg total) by mouth 2 (two) times daily. For anxiety (Patient not taking: Reported on 12/02/2021) 60 tablet 1   ondansetron (ZOFRAN) 4 MG tablet Take 1 tablet (4 mg total) by mouth every 6 (six) hours. (Patient not taking: Reported on 07/11/2022)  12 tablet 0   No current facility-administered medications on file prior to visit.    Allergies  Allergen Reactions   Dilaudid [Hydromorphone] Itching   Codeine Hives   Retin-A [Tretinoin] Other (See Comments)    Pt reports burning   Tape Rash    Social History   Socioeconomic History   Marital status: Married    Spouse name: Not on file   Number of children: 0   Years of education: 9th grade   Highest education level: 10th grade  Occupational History   Occupation: unemployed  Tobacco Use   Smoking status: Every Day    Packs/day: .5    Types: Cigarettes   Smokeless tobacco: Never  Vaping Use   Vaping Use: Never used  Substance and  Sexual Activity   Alcohol use: Not Currently   Drug use: Not Currently   Sexual activity: Yes    Birth control/protection: None  Other Topics Concern   Not on file  Social History Narrative   Not on file   Social Determinants of Health   Financial Resource Strain: Low Risk  (07/07/2022)   Overall Financial Resource Strain (CARDIA)    Difficulty of Paying Living Expenses: Not very hard  Food Insecurity: No Food Insecurity (07/07/2022)   Hunger Vital Sign    Worried About Running Out of Food in the Last Year: Never true    Ran Out of Food in the Last Year: Never true  Transportation Needs: No Transportation Needs (07/07/2022)   PRAPARE - Administrator, Civil Service (Medical): No    Lack of Transportation (Non-Medical): No  Physical Activity: Sufficiently Active (07/07/2022)   Exercise Vital Sign    Days of Exercise per Week: 7 days    Minutes of Exercise per Session: 40 min  Stress: Stress Concern Present (07/07/2022)   [employer] of Occupational Health - Occupational Stress Questionnaire    Feeling of Stress : Rather much  Social Connections: Moderately Integrated (07/07/2022)   Social Connection and Isolation Panel [NHANES]    Frequency of Communication with Friends and Family: More than three times a week    Frequency of Social Gatherings with Friends and Family: More than three times a week    Attends Religious Services: 1 to 4 times per year    Active Member of [employer] or Organizations: No    Attends Engineer, structural: Not on file    Marital Status: Married  Catering manager Violence: Not on file    Family History  Problem Relation Age of Onset   Breast cancer Mother        30s    Past Surgical History:  Procedure Laterality Date   CESAREAN SECTION  2004   CHOLECYSTECTOMY  2004   LAPAROSCOPIC UNILATERAL SALPINGO OOPHERECTOMY Right 11/15/2019   Procedure: LAPAROSCOPIC RIGHT SALPINGO OOPHORECTOMY AND RIGHT OVARIAN CYSTECTOMY;  Surgeon:  Adam Phenix, MD;  Location: [facility];  Service: Gynecology;  Laterality: Right;    ROS: Review of Systems Negative except as stated above  PHYSICAL EXAM: BP 117/79 (BP Location: Left Arm, Patient Position: Sitting, Cuff Size: Normal)   Pulse 80   Temp 98.3 F (36.8 C) (Oral)   Ht 5\' 8"  (1.727 m)   Wt 214 lb (97.1 kg)   SpO2 99%   BMI 32.54 kg/m   Wt Readings from Last 3 Encounters:  07/11/22 214 lb (97.1 kg)  03/12/22 224 lb 12.8 oz (102 kg)  02/25/22 224 lb 6.9 oz (  101.8 kg)    Physical Exam  General appearance - alert, well appearing, and in no distress Mental status - normal mood, behavior, speech, dress, motor activity, and thought processes Nose -moderate enlargement of nasal turbinate on the left side. Mouth - mucous membranes moist, pharynx normal without lesions Neck - supple, no significant adenopathy Chest - clear to auscultation, no wheezes, rales or rhonchi, symmetric air entry Heart - normal rate, regular rhythm, normal S1, S2, no murmurs, rubs, clicks or gallops Extremities - peripheral pulses normal, no pedal edema, no clubbing or cyanosis     07/11/2022   10:12 AM 07/11/2022   10:04 AM 10/08/2021   11:58 AM  Depression screen PHQ 2/9  Decreased Interest 0 0 0  Down, Depressed, Hopeless 1 1 0  PHQ - 2 Score 1 1 0  Altered sleeping 2  0  Tired, decreased energy 1  0  Change in appetite 0  0  Feeling bad or failure about yourself  0  0  Trouble concentrating 1  0  Moving slowly or fidgety/restless 0  0  Suicidal thoughts 0  0  PHQ-9 Score 5  0      07/11/2022   10:13 AM 10/08/2021   11:58 AM 07/26/2021    3:04 PM 05/31/2021   10:07 AM  GAD 7 : Generalized Anxiety Score  Nervous, Anxious, on Edge 2 0 0 2  Control/stop worrying 1 0 0 1  Worry too much - different things 1 0 0 1  Trouble relaxing 2 0 0 1  Restless 2 0 0 0  Easily annoyed or irritable 1 0 0 1  Afraid - awful might happen 0 0 0 0  Total GAD 7 Score 9 0 0 6         Latest Ref Rng & Units 03/12/2022   10:17 AM 09/09/2021   10:51 AM 07/01/2021    2:17 PM  CMP  Glucose 70 - 99 mg/dL 95  161  096   BUN 6 - 24 mg/dL 11  9  10    Creatinine 0.57 - 1.00 mg/dL 0.45  4.09  8.11   Sodium 134 - 144 mmol/L 145  139  139   Potassium 3.5 - 5.2 mmol/L 4.3  3.5  4.0   Chloride 96 - 106 mmol/L 106  104  103   CO2 20 - 29 mmol/L 22  23  25    Calcium 8.7 - 10.2 mg/dL 9.1  9.3  9.8   Total Protein 6.1 - 8.1 g/dL  7.0  7.2   Total Bilirubin 0.2 - 1.2 mg/dL  0.4  0.5   AST 10 - 30 U/L  22  25   ALT 6 - 29 U/L  27  34    Lipid Panel     Component Value Date/Time   CHOL 236 (H) 04/30/2021 1108   TRIG 159 (H) 04/30/2021 1108   HDL 48 04/30/2021 1108   LDLCALC 159 (H) 04/30/2021 1108    CBC    Component Value Date/Time   WBC 8.3 03/12/2022 1017   WBC 8.5 12/02/2021 0922   RBC 4.60 03/12/2022 1017   RBC 4.61 12/02/2021 0922   HGB 13.4 03/12/2022 1017   HCT 40.8 03/12/2022 1017   PLT 158 03/12/2022 1017   MCV 89 03/12/2022 1017   MCH 29.1 03/12/2022 1017   MCH 30.2 12/02/2021 0922   MCHC 32.8 03/12/2022 1017   MCHC 34.1 12/02/2021 0922   RDW 12.6 03/12/2022 1017  LYMPHSABS 2.5 03/12/2022 1017   MONOABS 0.5 01/10/2017 0929   EOSABS 0.3 03/12/2022 1017   BASOSABS 0.1 03/12/2022 1017    ASSESSMENT AND PLAN: 1. Anxiety BuSpar removed from med list.  Discussed trying her with Cymbalta for both depression and anxiety.  It would also help with some of the neuropathy symptoms that she has in her feet.  Patient willing to try the medication.  Advised to stop the medicine and be seen if she develops any worsening anxiety or suicidal ideation. - hydrOXYzine (VISTARIL) 25 MG capsule; Take 1 capsule (25 mg total) by mouth daily as needed.  Dispense: 60 capsule; Refill: 1 - DULoxetine (CYMBALTA) 20 MG capsule; Take 1 capsule (20 mg total) by mouth daily.  Dispense: 30 capsule; Refill: 3  2. Major depressive disorder, single episode, mild (HCC) See #1  above. - DULoxetine (CYMBALTA) 20 MG capsule; Take 1 capsule (20 mg total) by mouth daily.  Dispense: 30 capsule; Refill: 3  3. Essential hypertension At goal.  Continue spironolactone 100 mg daily. - spironolactone (ALDACTONE) 100 MG tablet; Take 1 tablet (100 mg total) by mouth daily.  Dispense: 90 tablet; Refill: 1  4. Obesity (BMI 30.0-34.9) Commended her on weight loss.  Encouraged her to continue regular exercise.  Discussed on encourage healthy eating habits.  I am not familiar with the supplement that she is taking for weight loss and I have informed her of that.  5. Sinus congestion - fluticasone (FLONASE) 50 MCG/ACT nasal spray; Place 1 spray into both nostrils daily as needed for allergies or rhinitis.  Dispense: 16 g; Refill: 1  6. Small fiber neuropathy - DULoxetine (CYMBALTA) 20 MG capsule; Take 1 capsule (20 mg total) by mouth daily.  Dispense: 30 capsule; Refill: 3      Patient was given the opportunity to ask questions.  Patient verbalized understanding of the plan and was able to repeat key elements of the plan.   This documentation was completed using Paediatric nurse.  Any transcriptional errors are unintentional.  No orders of the defined types were placed in this encounter.    Requested Prescriptions   Pending Prescriptions Disp Refills   hydrOXYzine (VISTARIL) 25 MG capsule 60 capsule 0    No follow-ups on file.  Jonah Blue, MD, FACP

## 2022-07-16 ENCOUNTER — Ambulatory Visit: Payer: Self-pay | Admitting: *Deleted

## 2022-07-16 MED ORDER — AMOXICILLIN 500 MG PO CAPS: 500.0000 mg | ORAL_CAPSULE | Freq: Three times a day (TID) | ORAL | 0 refills | Status: AC

## 2022-07-16 NOTE — Telephone Encounter (Addendum)
  Chief Complaint: Throat is more sore Symptoms: Has sores on the left side of her throat and it's very sore.   Also congestion left sinus.  Was seen on 07/11/2022 by Dr. Laural Benes.   Tested negative for strep at that time. Frequency: All other symptoms have gotten better but her throat is more sore. Pertinent Negatives: Patient denies fever or coughing.  Disposition: [] ED /[] Urgent Care (no appt availability in office) / [] Appointment(In office/virtual)/ []  [facility]/ [] Home Care/ [] Refused Recommended Disposition /[] [facility]/ [x]  Follow-up with PCP Additional Notes: Since pt just seen on 07/11/2022 a message has been sent to Dr. Laural Benes.   Pt was agreeable to someone calling her back.   If rx is sent in please send to [facility] at [facility].  Pt's new phone number is [phone].

## 2022-07-16 NOTE — Telephone Encounter (Signed)
Reason for Disposition  [1] Sore throat is the only symptom AND [2] present > 48 hours    Left side of nose/sinus is congested  Answer Assessment - Initial Assessment Questions 1. ONSET: "When did the throat start hurting?" (Hours or days ago)      I have a really bad sore throat.   She checked me the other day and I don't have Strep.   I got OTC Chloraseptic not helping.   Take Claritin too.    2. SEVERITY: "How bad is the sore throat?" (Scale 1-10; mild, moderate or severe)   - MILD (1-3):  Doesn't interfere with eating or normal activities.   - MODERATE (4-7): Interferes with eating some solids and normal activities.   - SEVERE (8-10):  Excruciating pain, interferes with most normal activities.   - SEVERE WITH DYSPHAGIA (10): Can't swallow liquids, drooling.     Moderate  3. STREP EXPOSURE: "Has there been any exposure to strep within the past week?" If Yes, ask: "What type of contact occurred?"      Negative when tested last week 4.  VIRAL SYMPTOMS: "Are there any symptoms of a cold, such as a runny nose, cough, hoarse voice or red eyes?"      My left side of my nose is congested.    5. FEVER: "Do you have a fever?" If Yes, ask: "What is your temperature, how was it measured, and when did it start?"     No 6. PUS ON THE TONSILS: "Is there pus on the tonsils in the back of your throat?"     I have a bump on the left side of my throat.  I have several bumps.    7. OTHER SYMPTOMS: "Do you have any other symptoms?" (e.g., difficulty breathing, headache, rash)     No 8. PREGNANCY: "Is there any chance you are pregnant?" "When was your last menstrual period?"     Not asked  Protocols used: Sore Throat-A-AH

## 2022-07-16 NOTE — Telephone Encounter (Signed)
Call placed to patient no answer VM left that Rxn sent to [facility] for Amoxil.

## 2022-07-16 NOTE — Addendum Note (Signed)
Addended by: Jonah Blue B on: 07/16/2022 04:41 PM   Modules accepted: Orders

## 2022-07-19 ENCOUNTER — Other Ambulatory Visit: Payer: Self-pay | Admitting: Internal Medicine

## 2022-07-19 DIAGNOSIS — G8929 Other chronic pain: Secondary | ICD-10-CM

## 2022-07-21 NOTE — Telephone Encounter (Signed)
Requested medication (s) are due for refill today: yes  Requested medication (s) are on the active medication list: yes  Last refill:  03/12/22  Future visit scheduled: yes  Notes to clinic:  Unable to refill per protocol, cannot delegate.      Requested Prescriptions  Pending Prescriptions Disp Refills   cyclobenzaprine (FLEXERIL) 10 MG tablet [Pharmacy Med Name: Cyclobenzaprine HCl 10 MG Oral Tablet] 30 tablet 0    Sig: TAKE 1 TABLET BY MOUTH AT BEDTIME     Not Delegated - Analgesics:  Muscle Relaxants Failed - 07/19/2022  2:32 PM      Failed - This refill cannot be delegated      Passed - Valid encounter within last 6 months    Recent Outpatient Visits           1 week ago Essential hypertension   [facility] Marcine Matar, MD   4 months ago Irregular periods   [facility], Marylene Land [place], [place]   7 months ago Need for hepatitis B vaccination   [facility], Cornelius Moras, RPH-CPP   9 months ago Essential hypertension   [facility] Marcine Matar, MD   12 months ago Pseudofolliculitis barbae    [facility] Claiborne Rigg, NP       Future Appointments             In 2 days Danelle Earthly, MD [facility]   In 1 month Marcine Matar, MD [facility]

## 2022-07-23 ENCOUNTER — Ambulatory Visit: Payer: Self-pay | Admitting: Internal Medicine

## 2022-08-04 ENCOUNTER — Other Ambulatory Visit: Payer: Self-pay | Admitting: Internal Medicine

## 2022-08-04 DIAGNOSIS — F419 Anxiety disorder, unspecified: Secondary | ICD-10-CM

## 2022-09-04 ENCOUNTER — Other Ambulatory Visit: Payer: Self-pay | Admitting: Internal Medicine

## 2022-09-04 DIAGNOSIS — F419 Anxiety disorder, unspecified: Secondary | ICD-10-CM

## 2022-09-16 ENCOUNTER — Encounter: Payer: Self-pay | Admitting: Internal Medicine

## 2022-09-16 ENCOUNTER — Ambulatory Visit: Payer: PRIVATE HEALTH INSURANCE | Attending: Internal Medicine | Admitting: Internal Medicine

## 2022-09-16 ENCOUNTER — Ambulatory Visit
Admission: RE | Admit: 2022-09-16 | Discharge: 2022-09-16 | Disposition: A | Discharge status: Home / Self Care | Payer: Self-pay | Source: Ambulatory Visit | Attending: Internal Medicine | Admitting: Internal Medicine

## 2022-09-16 VITALS — BP 115/88 | HR 77 | Temp 98.5°F | Ht 68.0 in | Wt 212.0 lb

## 2022-09-16 DIAGNOSIS — G8929 Other chronic pain: Secondary | ICD-10-CM

## 2022-09-16 DIAGNOSIS — M79672 Pain in left foot: Secondary | ICD-10-CM

## 2022-09-16 DIAGNOSIS — F32A Depression, unspecified: Secondary | ICD-10-CM

## 2022-09-16 DIAGNOSIS — F17210 Nicotine dependence, cigarettes, uncomplicated: Secondary | ICD-10-CM

## 2022-09-16 DIAGNOSIS — Z7984 Long term (current) use of oral hypoglycemic drugs: Secondary | ICD-10-CM

## 2022-09-16 DIAGNOSIS — G629 Polyneuropathy, unspecified: Secondary | ICD-10-CM

## 2022-09-16 DIAGNOSIS — R7303 Prediabetes: Secondary | ICD-10-CM

## 2022-09-16 DIAGNOSIS — F419 Anxiety disorder, unspecified: Secondary | ICD-10-CM

## 2022-09-16 LAB — POCT GLYCOSYLATED HEMOGLOBIN (HGB A1C): HbA1c, POC (prediabetic range): 5.5 % — AB (ref 5.7–6.4)

## 2022-09-16 MED ORDER — GABAPENTIN 300 MG PO CAPS: 300.0000 mg | ORAL_CAPSULE | Freq: Three times a day (TID) | ORAL | 1 refills | Status: DC

## 2022-09-16 NOTE — Patient Instructions (Signed)
Continue Cymbalta 20 mg daily. Increase gabapentin to 300 mg 3 times a day.  After being on this for 1 month, we can then increase it to 600 mg 3 times a day.

## 2022-09-16 NOTE — Progress Notes (Signed)
Patient ID: Heidi Bartlett, female    DOB: 26-May-1980  MRN: [id number]  CC: Hypertension (HTN f/u. Nicki Reaper Cymbalta makes pt feel " quiet & weird" /Reports that gabapentin is not working - feet are still burning. /L "Heel spur" X 1 mo, growing, painful )   Subjective: Heidi Bartlett is a 42 y.o. female who presents for 5 wks f/u.  Has her 1 yr old god-child with her. Her concerns today include:  History of HTN, obesity, hep C treated,  tob dep, chronic right-sided lower back pain, DUB/PCOS, preDM, GAD/MDD, small fiber neuropathy ([facility] neurology fall 2023).   GAD/MDD: On last visit patient reported  BuSpar made her feel depressed.  We changed her to Cymbalta 20 mg daily for depression/anxiety and also to help with neuropathy symptoms. Today she reports that the Cymbalta makes her feel quiet in a weird way. Still taking it;  finds it helpful.  Anxiety significantly decreased on Cymbalta.  No SI on the med. Also still having burning in feet despite being on Gabapentin 200 mg TID.  and Cymbalta.  Reports being placed on Lyrica up to 50 mg by [facility] neurologist.  Did not help.  Has bony over growth posterior heel of LT foot. There for about 1 yr.  Tender to touch and increase in size, see by mobile van for it and reports being told it is a bone spur. Has [employer] Insurance  PreDM: Results for orders placed or performed in visit on 09/16/22  POCT glycosylated hemoglobin (Hb A1C)  Result Value Ref Range   Hemoglobin A1C     HbA1c POC (<> result, manual entry)     HbA1c, POC (prediabetic range) 5.5 (A) 5.7 - 6.4 %   HbA1c, POC (controlled diabetic range)    No longer in range for prediabetes based on A1c today.  Weight is down 12 pounds since January of this year.  Patient Active Problem List   Diagnosis Date Noted   Mixed hyperlipidemia 05/02/2021   Elevated liver enzymes 05/02/2021   Essential hypertension 09/14/2020   Prediabetes 01/03/2020   Obesity (BMI 30-39.9) 01/03/2020    Alopecia 01/03/2020   Right ovarian cyst 10/17/2019   Dysfunctional uterine bleeding 07/02/2018   Sciatica of right side 11/24/2017   Tobacco dependence 11/24/2017     Current Outpatient Medications on File Prior to Visit  Medication Sig Dispense Refill   cyclobenzaprine (FLEXERIL) 10 MG tablet TAKE 1 TABLET BY MOUTH AT BEDTIME 30 tablet 0   DULoxetine (CYMBALTA) 20 MG capsule Take 1 capsule (20 mg total) by mouth daily. 30 capsule 3   fluticasone (FLONASE) 50 MCG/ACT nasal spray Place 1 spray into both nostrils daily as needed for allergies or rhinitis. 16 g 1   hydrOXYzine (VISTARIL) 25 MG capsule TAKE 1 CAPSULE BY MOUTH TWICE DAILY AS NEEDED 60 capsule 0   metFORMIN (GLUCOPHAGE) 500 MG tablet Take 1 tablet (500 mg total) by mouth daily with breakfast. 90 tablet 1   mupirocin ointment (BACTROBAN) 2 % Apply 1 application. topically 2 (two) times daily. 22 g 0   ondansetron (ZOFRAN) 4 MG tablet Take 1 tablet (4 mg total) by mouth every 6 (six) hours. (Patient not taking: Reported on 07/11/2022) 12 tablet 0   spironolactone (ALDACTONE) 100 MG tablet Take 1 tablet (100 mg total) by mouth daily. 90 tablet 1   No current facility-administered medications on file prior to visit.    Allergies  Allergen Reactions   Dilaudid [Hydromorphone] Itching   Codeine Hives  Retin-A [Tretinoin] Other (See Comments)    Pt reports burning   Buspar [Buspirone]     Made her feel more depress   Tape Rash    Social History   Socioeconomic History   Marital status: Married    Spouse name: Not on file   Number of children: 0   Years of education: 9th grade   Highest education level: 10th grade  Occupational History   Occupation: unemployed  Tobacco Use   Smoking status: Every Day    Current packs/day: 0.50    Types: Cigarettes   Smokeless tobacco: Never  Vaping Use   Vaping status: Never Used  Substance and Sexual Activity   Alcohol use: Not Currently   Drug use: Not Currently   Sexual  activity: Yes    Birth control/protection: None  Other Topics Concern   Not on file  Social History Narrative   Not on file   Social Determinants of Health   Financial Resource Strain: Low Risk  (07/07/2022)   Overall Financial Resource Strain (CARDIA)    Difficulty of Paying Living Expenses: Not very hard  Food Insecurity: No Food Insecurity (07/07/2022)   Hunger Vital Sign    Worried About Running Out of Food in the Last Year: Never true    Ran Out of Food in the Last Year: Never true  Transportation Needs: No Transportation Needs (07/07/2022)   PRAPARE - Administrator, Civil Service (Medical): No    Lack of Transportation (Non-Medical): No  Physical Activity: Sufficiently Active (07/07/2022)   Exercise Vital Sign    Days of Exercise per Week: 7 days    Minutes of Exercise per Session: 40 min  Stress: Stress Concern Present (07/07/2022)   [employer] of Occupational Health - Occupational Stress Questionnaire    Feeling of Stress : Rather much  Social Connections: Moderately Integrated (07/07/2022)   Social Connection and Isolation Panel [NHANES]    Frequency of Communication with Friends and Family: More than three times a week    Frequency of Social Gatherings with Friends and Family: More than three times a week    Attends Religious Services: 1 to 4 times per year    Active Member of [employer] or Organizations: No    Attends Engineer, structural: Not on file    Marital Status: Married  Catering manager Violence: Not on file    Family History  Problem Relation Age of Onset   Breast cancer Mother        30s    Past Surgical History:  Procedure Laterality Date   CESAREAN SECTION  2004   CHOLECYSTECTOMY  2004   LAPAROSCOPIC UNILATERAL SALPINGO OOPHERECTOMY Right 11/15/2019   Procedure: LAPAROSCOPIC RIGHT SALPINGO OOPHORECTOMY AND RIGHT OVARIAN CYSTECTOMY;  Surgeon: Adam Phenix, MD;  Location: [facility];  Service: Gynecology;   Laterality: Right;    ROS: Review of Systems Negative except as stated above  PHYSICAL EXAM: BP 115/88 (BP Location: Left Arm, Patient Position: Sitting, Cuff Size: Normal)   Pulse 77   Temp 98.5 F (36.9 C) (Oral)   Ht 5\' 8"  (1.727 m)   Wt 212 lb (96.2 kg)   SpO2 98%   BMI 32.23 kg/m   Wt Readings from Last 3 Encounters:  09/16/22 212 lb (96.2 kg)  07/11/22 214 lb (97.1 kg)  03/12/22 224 lb 12.8 oz (102 kg)    Physical Exam  General appearance - alert, well appearing, and in no distress Mental status -  normal mood, behavior, speech, dress, motor activity, and thought processes Musculoskeletal -left foot: She has overgrowth of soft tissue questionably cysts on the posterior heel of the left foot the low insertion point of the Achilles tendon.  No erythema.  Slightly tender to touch.     09/16/2022    9:19 AM 07/11/2022   10:12 AM 07/11/2022   10:04 AM  Depression screen PHQ 2/9  Decreased Interest 0 0 0  Down, Depressed, Hopeless 0 1 1  PHQ - 2 Score 0 1 1  Altered sleeping  2   Tired, decreased energy  1   Change in appetite  0   Feeling bad or failure about yourself   0   Trouble concentrating  1   Moving slowly or fidgety/restless  0   Suicidal thoughts  0   PHQ-9 Score  5       09/16/2022    9:20 AM 07/11/2022   10:13 AM 10/08/2021   11:58 AM 07/26/2021    3:04 PM  GAD 7 : Generalized Anxiety Score  Nervous, Anxious, on Edge 0 2 0 0  Control/stop worrying 0 1 0 0  Worry too much - different things 0 1 0 0  Trouble relaxing 0 2 0 0  Restless 0 2 0 0  Easily annoyed or irritable 0 1 0 0  Afraid - awful might happen 0 0 0 0  Total GAD 7 Score 0 9 0 0        Latest Ref Rng & Units 03/12/2022   10:17 AM 09/09/2021   10:51 AM 07/01/2021    2:17 PM  CMP  Glucose 70 - 99 mg/dL 95  536  644   BUN 6 - 24 mg/dL 11  9  10    Creatinine 0.57 - 1.00 mg/dL 0.34  7.42  5.95   Sodium 134 - 144 mmol/L 145  139  139   Potassium 3.5 - 5.2 mmol/L 4.3  3.5  4.0    Chloride 96 - 106 mmol/L 106  104  103   CO2 20 - 29 mmol/L 22  23  25    Calcium 8.7 - 10.2 mg/dL 9.1  9.3  9.8   Total Protein 6.1 - 8.1 g/dL  7.0  7.2   Total Bilirubin 0.2 - 1.2 mg/dL  0.4  0.5   AST 10 - 30 U/L  22  25   ALT 6 - 29 U/L  27  34    Lipid Panel     Component Value Date/Time   CHOL 236 (H) 04/30/2021 1108   TRIG 159 (H) 04/30/2021 1108   HDL 48 04/30/2021 1108   LDLCALC 159 (H) 04/30/2021 1108    CBC    Component Value Date/Time   WBC 8.3 03/12/2022 1017   WBC 8.5 12/02/2021 0922   RBC 4.60 03/12/2022 1017   RBC 4.61 12/02/2021 0922   HGB 13.4 03/12/2022 1017   HCT 40.8 03/12/2022 1017   PLT 158 03/12/2022 1017   MCV 89 03/12/2022 1017   MCH 29.1 03/12/2022 1017   MCH 30.2 12/02/2021 0922   MCHC 32.8 03/12/2022 1017   MCHC 34.1 12/02/2021 0922   RDW 12.6 03/12/2022 1017   LYMPHSABS 2.5 03/12/2022 1017   MONOABS 0.5 01/10/2017 0929   EOSABS 0.3 03/12/2022 1017   BASOSABS 0.1 03/12/2022 1017    ASSESSMENT AND PLAN:  1. Small fiber neuropathy Recommend increase gabapentin to 300 mg 3 times a day.  If she tolerates this, after  1 month if symptoms still not bearable, we can increase to 600 mg 3 times a day. - gabapentin (NEURONTIN) 300 MG capsule; Take 1 capsule (300 mg total) by mouth 3 (three) times daily.  Dispense: 90 capsule; Refill: 1  2. Anxiety and depression Doing much better on Cymbalta 20 mg daily.  Even though she states the Cymbalta makes her quite, she prefers to stay on the medication because it has helped the anxiety/depression  3. Prediabetes No longer in prediabetes range.  Commended on weight loss. - POCT glycosylated hemoglobin (Hb A1C)  4. Heel pain, chronic, left Questionable cyst versus bony overgrowth. - DG Ankle Complete Left; Future - Ambulatory referral to Podiatry  Patient to call back with her insurance information.  Patient was given the opportunity to ask questions.  Patient verbalized understanding of the plan  and was able to repeat key elements of the plan.   This documentation was completed using Paediatric nurse.  Any transcriptional errors are unintentional.  Orders Placed This Encounter  Procedures   DG Ankle Complete Left   Ambulatory referral to Podiatry   POCT glycosylated hemoglobin (Hb A1C)     Requested Prescriptions   Signed Prescriptions Disp Refills   gabapentin (NEURONTIN) 300 MG capsule 90 capsule 1    Sig: Take 1 capsule (300 mg total) by mouth 3 (three) times daily.    Return in about 4 months (around 01/17/2023).  Jonah Blue, MD, FACP

## 2022-09-22 ENCOUNTER — Other Ambulatory Visit: Payer: Self-pay | Admitting: Internal Medicine

## 2022-09-22 DIAGNOSIS — G8929 Other chronic pain: Secondary | ICD-10-CM

## 2022-09-23 NOTE — Telephone Encounter (Signed)
Requested medication (s) are due for refill today: Yes  Requested medication (s) are on the active medication list: Yes  Last refill:  07/21/22 #30 0RF  Future visit scheduled: Yes  Notes to clinic:  Unable to refill per protocol, cannot delegate.      Requested Prescriptions  Pending Prescriptions Disp Refills   cyclobenzaprine (FLEXERIL) 10 MG tablet [Pharmacy Med Name: Cyclobenzaprine HCl 10 MG Oral Tablet] 30 tablet 0    Sig: TAKE 1 TABLET BY MOUTH AT BEDTIME     Not Delegated - Analgesics:  Muscle Relaxants Failed - 09/22/2022 10:34 AM      Failed - This refill cannot be delegated      Passed - Valid encounter within last 6 months    Recent Outpatient Visits           1 week ago Small fiber neuropathy   [place] [facility] Marcine Matar, MD   2 months ago Essential hypertension   [place] [facility] Marcine Matar, MD   6 months ago Irregular periods   [facility] [place], Marylene Land [place], [place]   9 months ago Need for hepatitis B vaccination   [facility] [place], Cornelius Moras, RPH-CPP   11 months ago Essential hypertension   [facility] Marcine Matar, MD       Future Appointments             In 3 months Laural Benes, Binnie Rail, MD [facility]

## 2022-10-08 ENCOUNTER — Encounter: Payer: Self-pay | Admitting: Podiatry

## 2022-10-08 ENCOUNTER — Ambulatory Visit (INDEPENDENT_AMBULATORY_CARE_PROVIDER_SITE_OTHER): Payer: Medicaid Other | Admitting: Podiatry

## 2022-10-08 DIAGNOSIS — M7662 Achilles tendinitis, left leg: Secondary | ICD-10-CM | POA: Diagnosis not present

## 2022-10-08 MED ORDER — TRIAMCINOLONE ACETONIDE 10 MG/ML IJ SUSP
10.0000 mg | Freq: Once | INTRAMUSCULAR | Status: AC
  Administered 2022-10-08: 10 mg via INTRA_ARTICULAR

## 2022-10-08 NOTE — Progress Notes (Signed)
Subjective:   Patient ID: Heidi Bartlett, female   DOB: 42 y.o.   MRN: [id number]   HPI Patient presents with pain in the back of the left heel stating it has been hurting her for several months and hard to walk with and states she does not remember injury.  Patient does smoke half a pack per day tries to be active   Review of Systems  All other systems reviewed and are negative.       Objective:  Physical Exam Vitals and nursing note reviewed.  Constitutional:      Appearance: She is well-developed.  Pulmonary:     Effort: Pulmonary effort is normal.  Musculoskeletal:        General: Normal range of motion.  Skin:    General: Skin is warm.  Neurological:     Mental Status: She is alert.     Neurovascular status intact muscle strength adequate range of motion adequate inflammation of the posterior left heel medial side painful when pressed localized no center or lateral involvement mild equinus and good muscle strength     Assessment:  Appears to be acute Achilles tendinitis left     Plan:  H NP reviewed x-rays that had been taken discussed injection explained risk and the possibility of rupture associated with this and patient is willing to accept this risk.  I did careful sterile prep I injected the medial side stayed away from the central lateral portion of the tendon 3 mg dexamethasone Kenalog 5 mg Xylocaine reviewed she does have spur formation but do not think this is the biggest problem.  Dispensed heel lift

## 2022-10-19 ENCOUNTER — Other Ambulatory Visit: Payer: Self-pay | Admitting: Internal Medicine

## 2022-10-19 DIAGNOSIS — F419 Anxiety disorder, unspecified: Secondary | ICD-10-CM

## 2022-10-28 ENCOUNTER — Ambulatory Visit: Payer: PRIVATE HEALTH INSURANCE

## 2022-10-28 ENCOUNTER — Ambulatory Visit: Payer: Medicaid Other | Attending: Critical Care Medicine | Admitting: Critical Care Medicine

## 2022-10-28 ENCOUNTER — Encounter: Payer: Self-pay | Admitting: Critical Care Medicine

## 2022-10-28 VITALS — BP 120/83 | HR 81 | Wt 213.0 lb

## 2022-10-28 DIAGNOSIS — J0100 Acute maxillary sinusitis, unspecified: Secondary | ICD-10-CM | POA: Diagnosis not present

## 2022-10-28 DIAGNOSIS — H66002 Acute suppurative otitis media without spontaneous rupture of ear drum, left ear: Secondary | ICD-10-CM

## 2022-10-28 DIAGNOSIS — L738 Other specified follicular disorders: Secondary | ICD-10-CM | POA: Diagnosis not present

## 2022-10-28 MED ORDER — ALBUTEROL SULFATE HFA 108 (90 BASE) MCG/ACT IN AERS: 2.0000 | INHALATION_SPRAY | Freq: Four times a day (QID) | RESPIRATORY_TRACT | 0 refills | Status: AC | PRN

## 2022-10-28 MED ORDER — BENZONATATE 100 MG PO CAPS: 100.0000 mg | ORAL_CAPSULE | Freq: Two times a day (BID) | ORAL | 0 refills | Status: DC | PRN

## 2022-10-28 MED ORDER — CEFDINIR 300 MG PO CAPS: 300.0000 mg | ORAL_CAPSULE | Freq: Two times a day (BID) | ORAL | 0 refills | Status: AC

## 2022-10-28 MED ORDER — FLUCONAZOLE 150 MG PO TABS: 150.0000 mg | ORAL_TABLET | Freq: Once | ORAL | 0 refills | Status: AC

## 2022-10-28 NOTE — Patient Instructions (Signed)
Take Cefdinir twice daily for 7 days Albuterol 2 puffs every 6 hours for shortness of breath Benzonatate 1 twice daily as needed for cough Fluconazole 1 dose toward end of antibiotic course Get a saline nasal rinse 3 sprays each nostril until improved Follow up in 1 month and sooner if not improved

## 2022-10-29 ENCOUNTER — Ambulatory Visit: Payer: PRIVATE HEALTH INSURANCE

## 2022-10-29 NOTE — Progress Notes (Unsigned)
Acute  Subjective:  Patient presents to clinic with complaints of "bad sinuses and allergies." She reports sick contact on 9/2. On 9/3 she started experiencing body aches, nasal congestion, productive cough w/ greenish secretions. Her cough is worse at night and she has not been able to sleep. Patient says she has been having intermittent shortness of breath with exertion. She also started experiencing left ear pain since 9/5. Today she has noticed a red rash on her lower abdomen. She reports shaving the area earlier. Patient also says she is a smoker but she has decreased usage since becoming ill.   Patient ID: Heidi Bartlett, female    DOB: September 30, 1980, 42 y.o.   MRN: [id number]  Chief Complaint  Patient presents with   Cough   Nasal Congestion   Dizziness    Cough Associated symptoms include a fever, myalgias, a rash, shortness of breath and wheezing.  Dizziness Associated symptoms include coughing, a fever, myalgias and a rash.   Patient is in today for ***  Review of Systems  Constitutional:  Positive for fever and malaise/fatigue.  HENT:  Positive for sinus pain and tinnitus.   Eyes: Negative.   Respiratory:  Positive for cough, shortness of breath and wheezing.   Cardiovascular: Negative.   Gastrointestinal: Negative.   Genitourinary: Negative.   Musculoskeletal:  Positive for myalgias.  Skin:  Positive for rash.  Neurological:  Positive for dizziness.  Endo/Heme/Allergies: Negative.   Psychiatric/Behavioral: Negative.          Objective:    BP 120/83 (BP Location: Left Arm, Patient Position: Sitting, Cuff Size: Normal)   Pulse 81   Wt 96.6 kg   SpO2 98%   BMI 32.39 kg/m  {Vitals History (Optional):23777}  Physical Exam HENT:     Head: Normocephalic.     Right Ear: Hearing, tympanic membrane and ear canal normal.     Left Ear: Hearing, ear canal and external ear normal. Tympanic membrane is erythematous.     Ears:     Comments: Left tympanic  membrane erythematous      Nose:     Right Turbinates: Swollen and pale.     Left Turbinates: Swollen and pale.     Left Sinus: Maxillary sinus tenderness present.     Mouth/Throat:     Pharynx: Oropharynx is clear. Uvula midline.  Cardiovascular:     Rate and Rhythm: Normal rate and regular rhythm.     Pulses: Normal pulses.     Heart sounds: Normal heart sounds.  Pulmonary:     Breath sounds: Examination of the right-middle field reveals decreased breath sounds and wheezing. Examination of the left-middle field reveals decreased breath sounds and wheezing. Examination of the right-lower field reveals decreased breath sounds and wheezing. Examination of the left-lower field reveals decreased breath sounds and wheezing. Decreased breath sounds and wheezing present.     Comments: Faint expiratory wheeze in bases Abdominal:     General: Bowel sounds are normal.     Palpations: Abdomen is soft.     Comments: obese  Musculoskeletal:        General: Normal range of motion.     Cervical back: Normal range of motion.  Skin:    General: Skin is warm and dry.     Findings: Rash present. Rash is macular.     Comments: Diffuse macular rash under pannus; patient reports shaving area  Neurological:     Mental Status: She is alert and oriented to person, place, and  time.  Psychiatric:        Mood and Affect: Mood normal.        Behavior: Behavior normal.     No results found for any visits on 10/28/22.      Assessment & Plan:   Problem List Items Addressed This Visit   None Cough Sinus pain Ear pain Wheezing  Meds ordered this encounter  Medications   cefdinir (OMNICEF) 300 MG capsule    Sig: Take 1 capsule (300 mg total) by mouth 2 (two) times daily for 7 days.    Dispense:  14 capsule    Refill:  0   benzonatate (TESSALON) 100 MG capsule    Sig: Take 1 capsule (100 mg total) by mouth 2 (two) times daily as needed for cough.    Dispense:  20 capsule    Refill:  0    albuterol (VENTOLIN HFA) 108 (90 Base) MCG/ACT inhaler    Sig: Inhale 2 puffs into the lungs every 6 (six) hours as needed for wheezing or shortness of breath.    Dispense:  6.7 g    Refill:  0   fluconazole (DIFLUCAN) 150 MG tablet    Sig: Take 1 tablet (150 mg total) by mouth once for 1 dose.    Dispense:  1 tablet    Refill:  0    Return in about 1 month (around 11/27/2022), or if symptoms worsen or fail to improve.  Burnard Bunting, RN Burnard Bunting, [facility]

## 2022-10-30 ENCOUNTER — Encounter: Payer: Self-pay | Admitting: Critical Care Medicine

## 2022-10-30 DIAGNOSIS — J0100 Acute maxillary sinusitis, unspecified: Secondary | ICD-10-CM | POA: Insufficient documentation

## 2022-10-30 DIAGNOSIS — L738 Other specified follicular disorders: Secondary | ICD-10-CM | POA: Insufficient documentation

## 2022-10-30 DIAGNOSIS — H66002 Acute suppurative otitis media without spontaneous rupture of ear drum, left ear: Secondary | ICD-10-CM | POA: Insufficient documentation

## 2022-10-30 NOTE — Assessment & Plan Note (Signed)
Findings compatible with acute otitis media Patient will receive antibiotic to cover otitis as per sinusitis treatment

## 2022-10-30 NOTE — Assessment & Plan Note (Signed)
Findings compatible with acute sinusitis Plan will be to administer Cefdinir 300 mg twice daily for 7 days Patient will also be instructed to purchase saline nasal spray 3 sprays to each nostril 3 times daily until improved Patient to call if unimproved in 10 days or prn

## 2022-10-30 NOTE — Assessment & Plan Note (Signed)
Findings on anterior lower abdomen consistent with folliculitis due to shaving Will cover with same antibiotic per sinusitis and otitis

## 2022-11-12 ENCOUNTER — Other Ambulatory Visit: Payer: Self-pay | Admitting: Physician Assistant

## 2022-11-12 DIAGNOSIS — R7303 Prediabetes: Secondary | ICD-10-CM

## 2022-11-12 DIAGNOSIS — E6609 Other obesity due to excess calories: Secondary | ICD-10-CM

## 2022-11-12 DIAGNOSIS — N938 Other specified abnormal uterine and vaginal bleeding: Secondary | ICD-10-CM

## 2022-11-19 ENCOUNTER — Other Ambulatory Visit: Payer: Self-pay | Admitting: Internal Medicine

## 2022-11-19 DIAGNOSIS — G629 Polyneuropathy, unspecified: Secondary | ICD-10-CM

## 2022-11-20 NOTE — Telephone Encounter (Signed)
Requested Prescriptions  Pending Prescriptions Disp Refills   gabapentin (NEURONTIN) 300 MG capsule [Pharmacy Med Name: Gabapentin 300 MG Oral Capsule] 90 capsule 0    Sig: TAKE 1 CAPSULE BY MOUTH THREE TIMES DAILY     Neurology: Anticonvulsants - gabapentin Passed - 11/19/2022  2:56 PM      Passed - Cr in normal range and within 360 days    Creat  Date Value Ref Range Status  09/09/2021 0.76 0.50 - 0.99 mg/dL Final   Creatinine, Ser  Date Value Ref Range Status  03/12/2022 0.71 0.57 - 1.00 mg/dL Final         Passed - Completed PHQ-2 or PHQ-9 in the last 360 days      Passed - Valid encounter within last 12 months    Recent Outpatient Visits           3 weeks ago Acute non-recurrent maxillary sinusitis   [place] [facility] Storm Frisk, MD   2 months ago Small fiber neuropathy   [facility] Marcine Matar, MD   4 months ago Essential hypertension   [place] [facility] Marcine Matar, MD   8 months ago Irregular periods   [facility] [place], Marylene Land [place], [place]   11 months ago Need for hepatitis B vaccination   [facility] [place], Cornelius Moras, RPH-CPP       Future Appointments             In 2 months Laural Benes, Binnie Rail, MD [facility]

## 2022-12-23 ENCOUNTER — Ambulatory Visit: Payer: Medicaid Other | Attending: Family Medicine | Admitting: Family Medicine

## 2022-12-23 ENCOUNTER — Ambulatory Visit: Payer: Self-pay

## 2022-12-23 ENCOUNTER — Encounter: Payer: Self-pay | Admitting: Family Medicine

## 2022-12-23 VITALS — BP 108/75 | HR 92 | Ht 68.0 in | Wt 216.2 lb

## 2022-12-23 DIAGNOSIS — R7303 Prediabetes: Secondary | ICD-10-CM

## 2022-12-23 DIAGNOSIS — G629 Polyneuropathy, unspecified: Secondary | ICD-10-CM

## 2022-12-23 MED ORDER — DULOXETINE HCL 60 MG PO CPEP: 60.0000 mg | ORAL_CAPSULE | Freq: Every day | ORAL | 3 refills | Status: DC

## 2022-12-23 NOTE — Patient Instructions (Signed)
VISIT SUMMARY:  During today's visit, we discussed your ongoing burning foot pain and reviewed your current medications. We also talked about your prediabetes and its management.  YOUR PLAN:  -SMALL FIBER NEUROPATHY: Small fiber neuropathy is a condition that affects the small nerve fibers in your skin, causing pain, burning, and numbness. We will increase your Cymbalta dose to 60mg  daily to help manage your symptoms. Additionally, we will check your Vitamin B12 levels to rule out any deficiency that might be contributing to your symptoms. A referral to a neurologist will be made for further evaluation and management.  -PREDIABETES: Prediabetes means your blood sugar levels are higher than normal but not high enough to be classified as diabetes. We will continue with your current management and monitoring plan.  INSTRUCTIONS:  Please follow up with the neurology department as soon as you receive the referral. Also, ensure you get your Vitamin B12 levels checked as ordered. Continue taking your medications as prescribed and monitor your symptoms. If you experience any new or worsening symptoms, please contact our office.

## 2022-12-23 NOTE — Progress Notes (Signed)
Subjective:  Patient ID: Heidi Bartlett, female    DOB: 08-15-1980  Age: 42 y.o. MRN: [id number]  CC: Foot Pain   HPI Heidi Bartlett is a 42 y.o. year old female with a history of prediabetes, hypertension, hyperlipidemia, small fiber neuropathy.  Interval History: Discussed the use of AI scribe software for clinical note transcription with the patient, who gave verbal consent to proceed.  She presents with persistent burning foot pain. The pain is described as 'like somebody's taking a lighter and burning my feet like a big old torch.' The pain is exacerbated by touch and is particularly severe at night. Despite treatment with gabapentin (increased by from 300mg  tid to 600 mg three times a day) and Cymbalta 20 mg, there has been no improvement in symptoms. The patient has a family history of similar symptoms in her grandmother and great-grandmother. She also reports occasional numbness and burning in the hands, particularly when standing for extended periods.  She denies alcohol consumption.  Previously seen by a neurologist with [facility] and it appears she did have EMG and NCV but I am unable to retrieve results.  She would like to see a local Neurologist due to the drive    Past Medical History:  Diagnosis Date   Abnormal uterine bleeding (AUB)    Anxiety    Pre-diabetes     Past Surgical History:  Procedure Laterality Date   CESAREAN SECTION  2004   CHOLECYSTECTOMY  2004   LAPAROSCOPIC UNILATERAL SALPINGO OOPHERECTOMY Right 11/15/2019   Procedure: LAPAROSCOPIC RIGHT SALPINGO OOPHORECTOMY AND RIGHT OVARIAN CYSTECTOMY;  Surgeon: Adam Phenix, MD;  Location:  SURGERY CENTER;  Service: Gynecology;  Laterality: Right;    Family History  Problem Relation Age of Onset   Breast cancer Mother        55s    Social History   Socioeconomic History   Marital status: Married    Spouse name: Not on file   Number of children: 0   Years of education:  9th grade   Highest education level: 10th grade  Occupational History   Occupation: unemployed  Tobacco Use   Smoking status: Every Day    Current packs/day: 0.50    Types: Cigarettes   Smokeless tobacco: Never  Vaping Use   Vaping status: Never Used  Substance and Sexual Activity   Alcohol use: Not Currently   Drug use: Not Currently   Sexual activity: Yes    Birth control/protection: None  Other Topics Concern   Not on file  Social History Narrative   Not on file   Social Determinants of Health   Financial Resource Strain: Low Risk  (07/07/2022)   Overall Financial Resource Strain (CARDIA)    Difficulty of Paying Living Expenses: Not very hard  Food Insecurity: No Food Insecurity (07/07/2022)   Hunger Vital Sign    Worried About Running Out of Food in the Last Year: Never true    Ran Out of Food in the Last Year: Never true  Transportation Needs: No Transportation Needs (07/07/2022)   PRAPARE - Administrator, Civil Service (Medical): No    Lack of Transportation (Non-Medical): No  Physical Activity: Sufficiently Active (07/07/2022)   Exercise Vital Sign    Days of Exercise per Week: 7 days    Minutes of Exercise per Session: 40 min  Stress: Stress Concern Present (07/07/2022)   [employer] of Occupational Health - Occupational Stress Questionnaire    Feeling of Stress :  Rather much  Social Connections: Moderately Integrated (07/07/2022)   Social Connection and Isolation Panel [NHANES]    Frequency of Communication with Friends and Family: More than three times a week    Frequency of Social Gatherings with Friends and Family: More than three times a week    Attends Religious Services: 1 to 4 times per year    Active Member of [employer] or Organizations: No    Attends Engineer, structural: Not on file    Marital Status: Married    Allergies  Allergen Reactions   Dilaudid [Hydromorphone] Itching   Codeine Hives   Retin-A [Tretinoin] Other (See  Comments)    Pt reports burning   Buspar [Buspirone]     Made her feel more depress   Tape Rash    Outpatient Medications Prior to Visit  Medication Sig Dispense Refill   albuterol (VENTOLIN HFA) 108 (90 Base) MCG/ACT inhaler Inhale 2 puffs into the lungs every 6 (six) hours as needed for wheezing or shortness of breath. 6.7 g 0   cyclobenzaprine (FLEXERIL) 10 MG tablet TAKE 1 TABLET BY MOUTH AT BEDTIME 90 tablet 0   fluticasone (FLONASE) 50 MCG/ACT nasal spray Place 1 spray into both nostrils daily as needed for allergies or rhinitis. 16 g 1   gabapentin (NEURONTIN) 300 MG capsule TAKE 1 CAPSULE BY MOUTH THREE TIMES DAILY 90 capsule 0   hydrOXYzine (VISTARIL) 25 MG capsule TAKE 1 CAPSULE BY MOUTH TWICE DAILY AS NEEDED 180 capsule 0   metFORMIN (GLUCOPHAGE) 500 MG tablet Take 1 tablet by mouth once daily with breakfast 90 tablet 1   mupirocin ointment (BACTROBAN) 2 % Apply 1 application. topically 2 (two) times daily. 22 g 0   ondansetron (ZOFRAN) 4 MG tablet Take 1 tablet (4 mg total) by mouth every 6 (six) hours. 12 tablet 0   spironolactone (ALDACTONE) 100 MG tablet Take 1 tablet (100 mg total) by mouth daily. 90 tablet 1   benzonatate (TESSALON) 100 MG capsule Take 1 capsule (100 mg total) by mouth 2 (two) times daily as needed for cough. (Patient not taking: Reported on 12/23/2022) 20 capsule 0   DULoxetine (CYMBALTA) 20 MG capsule Take 1 capsule (20 mg total) by mouth daily. (Patient not taking: Reported on 12/23/2022) 30 capsule 3   No facility-administered medications prior to visit.     ROS Review of Systems  Constitutional:  Negative for activity change and appetite change.  HENT:  Negative for sinus pressure and sore throat.   Respiratory:  Negative for chest tightness, shortness of breath and wheezing.   Cardiovascular:  Negative for chest pain and palpitations.  Gastrointestinal:  Negative for abdominal distention, abdominal pain and constipation.  Genitourinary: Negative.    Musculoskeletal: Negative.   Psychiatric/Behavioral:  Negative for behavioral problems and dysphoric mood.     Objective:  BP 108/75   Pulse 92   Ht 5\' 8"  (1.727 m)   Wt 216 lb 3.2 oz (98.1 kg)   SpO2 99%   BMI 32.87 kg/m      12/23/2022    3:45 PM 10/28/2022    3:42 PM 09/16/2022    9:12 AM  BP/Weight  Systolic BP 108 120 115  Diastolic BP 75 83 88  Wt. (Lbs) 216.2 213 212  BMI 32.87 kg/m2 32.39 kg/m2 32.23 kg/m2      Physical Exam Constitutional:      Appearance: She is well-developed.  Cardiovascular:     Rate and Rhythm: Normal rate.  Heart sounds: Normal heart sounds. No murmur heard. Pulmonary:     Effort: Pulmonary effort is normal.     Breath sounds: Normal breath sounds. No wheezing or rales.  Chest:     Chest wall: No tenderness.  Abdominal:     General: Bowel sounds are normal. There is no distension.     Palpations: Abdomen is soft. There is no mass.     Tenderness: There is no abdominal tenderness.  Musculoskeletal:        General: Normal range of motion.     Right lower leg: No edema.     Left lower leg: No edema.  Neurological:     Mental Status: She is alert and oriented to person, place, and time.  Psychiatric:        Mood and Affect: Mood normal.        Latest Ref Rng & Units 03/12/2022   10:17 AM 09/09/2021   10:51 AM 07/01/2021    2:17 PM  CMP  Glucose 70 - 99 mg/dL 95  696  295   BUN 6 - 24 mg/dL 11  9  10    Creatinine 0.57 - 1.00 mg/dL 2.84  1.32  4.40   Sodium 134 - 144 mmol/L 145  139  139   Potassium 3.5 - 5.2 mmol/L 4.3  3.5  4.0   Chloride 96 - 106 mmol/L 106  104  103   CO2 20 - 29 mmol/L 22  23  25    Calcium 8.7 - 10.2 mg/dL 9.1  9.3  9.8   Total Protein 6.1 - 8.1 g/dL  7.0  7.2   Total Bilirubin 0.2 - 1.2 mg/dL  0.4  0.5   AST 10 - 30 U/L  22  25   ALT 6 - 29 U/L  27  34     Lipid Panel     Component Value Date/Time   CHOL 236 (H) 04/30/2021 1108   TRIG 159 (H) 04/30/2021 1108   HDL 48 04/30/2021 1108    LDLCALC 159 (H) 04/30/2021 1108    CBC    Component Value Date/Time   WBC 8.3 03/12/2022 1017   WBC 8.5 12/02/2021 0922   RBC 4.60 03/12/2022 1017   RBC 4.61 12/02/2021 0922   HGB 13.4 03/12/2022 1017   HCT 40.8 03/12/2022 1017   PLT 158 03/12/2022 1017   MCV 89 03/12/2022 1017   MCH 29.1 03/12/2022 1017   MCH 30.2 12/02/2021 0922   MCHC 32.8 03/12/2022 1017   MCHC 34.1 12/02/2021 0922   RDW 12.6 03/12/2022 1017   LYMPHSABS 2.5 03/12/2022 1017   MONOABS 0.5 01/10/2017 0929   EOSABS 0.3 03/12/2022 1017   BASOSABS 0.1 03/12/2022 1017    Lab Results  Component Value Date   HGBA1C 5.5 (A) 09/16/2022    Assessment & Plan:      Small Fiber Neuropathy Persistent burning sensation in feet despite Gabapentin 600mg  TID and Cymbalta 20mg  daily. Family history of similar symptoms. No improvement with increased Gabapentin dose. -Increase Cymbalta to 60mg  daily. -Order Vitamin B12 level to rule out deficiency as a contributing factor. -Refer to neurology for further evaluation and management.  Prediabetes -She has no diabetes, last A1c was 5.5         Meds ordered this encounter  Medications   DULoxetine (CYMBALTA) 60 MG capsule    Sig: Take 1 capsule (60 mg total) by mouth daily.    Dispense:  30 capsule    Refill:  3    Dose increase    Follow-up: Return in about 3 months (around 03/25/2023) for Medical conditions with PCP.       Hoy Register, MD, FAAFP. [facility] [place], [place] [phone]   12/23/2022, 4:22 PM

## 2022-12-23 NOTE — Telephone Encounter (Signed)
  Chief Complaint: feet pain Symptoms: severe pain worse with socks and shoes on /pt thinks  maybe the Gabapentin needs adjusting Frequency: ongoing   Disposition: [] ED /[] Urgent Care (no appt availability in office) / [x] Appointment(In office/virtual)/ []  [facility]/ [] Home Care/ [] Refused Recommended Disposition /[] [facility]/ []  Follow-up with PCP Additional Notes: Teams messaged Cassandra for this last minute appt slot Reason for Disposition  [1] SEVERE pain (e.g., excruciating, unable to do any normal activities) AND [2] not improved after 2 hours of pain medicine  Answer Assessment - Initial Assessment Questions 1. ONSET: "When did the pain start?"      ongoing 2. LOCATION: "Where is the pain located?"      Both feet 3. PAIN: "How bad is the pain?"    (Scale 1-10; or mild, moderate, severe)  - MILD (1-3): doesn't interfere with normal activities.   - MODERATE (4-7): interferes with normal activities (e.g., work or school) or awakens from sleep, limping.   - SEVERE (8-10): excruciating pain, unable to do any normal activities, unable to walk.      severe 4. WORK OR EXERCISE: "Has there been any recent work or exercise that involved this part of the body?"      no 5. CAUSE: "What do you think is causing the foot pain?"     Gabapentin needed 6. OTHER SYMPTOMS: "Do you have any other symptoms?" (e.g., leg pain, rash, fever, numbness)     twitching 7. PREGNANCY: "Is there any chance you are pregnant?" "When was your last menstrual period?"     N/a  Protocols used: Foot Pain-A-AH

## 2022-12-24 LAB — BASIC METABOLIC PANEL
BUN/Creatinine Ratio: 9 (ref 9–23)
BUN: 7 mg/dL (ref 6–24)
CO2: 28 mmol/L (ref 20–29)
Calcium: 9.4 mg/dL (ref 8.7–10.2)
Chloride: 105 mmol/L (ref 96–106)
Creatinine, Ser: 0.75 mg/dL (ref 0.57–1.00)
Glucose: 76 mg/dL (ref 70–99)
Potassium: 4.9 mmol/L (ref 3.5–5.2)
Sodium: 143 mmol/L (ref 134–144)
eGFR: 103 mL/min/{1.73_m2} (ref >59)

## 2022-12-24 LAB — VITAMIN B12: Vitamin B-12: 650 pg/mL (ref 232–1245)

## 2022-12-25 ENCOUNTER — Encounter: Payer: Self-pay | Admitting: Neurology

## 2023-01-03 ENCOUNTER — Other Ambulatory Visit: Payer: Self-pay | Admitting: Internal Medicine

## 2023-01-03 DIAGNOSIS — G629 Polyneuropathy, unspecified: Secondary | ICD-10-CM

## 2023-01-05 NOTE — Telephone Encounter (Signed)
Requested Prescriptions  Pending Prescriptions Disp Refills   gabapentin (NEURONTIN) 300 MG capsule [Pharmacy Med Name: Gabapentin 300 MG Oral Capsule] 90 capsule 0    Sig: TAKE 1 CAPSULE BY MOUTH THREE TIMES DAILY     Neurology: Anticonvulsants - gabapentin Passed - 01/03/2023  9:14 AM      Passed - Cr in normal range and within 360 days    Creat  Date Value Ref Range Status  09/09/2021 0.76 0.50 - 0.99 mg/dL Final   Creatinine, Ser  Date Value Ref Range Status  12/23/2022 0.75 0.57 - 1.00 mg/dL Final         Passed - Completed PHQ-2 or PHQ-9 in the last 360 days      Passed - Valid encounter within last 12 months    Recent Outpatient Visits           1 week ago Prediabetes   [facility] [place]. [facility] Hoy Register, MD   2 months ago Acute non-recurrent maxillary sinusitis   [facility] Dept Of [place]. [facility] Storm Frisk, MD   3 months ago Small fiber neuropathy   [facility] Of [place]. [facility] Marcine Matar, MD   5 months ago Essential hypertension   [facility] A Dept Of . [facility] Marcine Matar, MD   9 months ago Irregular periods   [facility] Dept Of . [facility] [place], Marzella Schlein, [place]       Future Appointments             In 2 weeks Laural Benes, Binnie Rail, MD [facility] - A Dept Of Eligha Bridegroom. [facility]   In 3 weeks [place], Manus Gunning, MD [facility]   In 2 months Laural Benes Binnie Rail, MD [facility] [place] - A Dept Of Eligha Bridegroom. [facility]

## 2023-01-16 ENCOUNTER — Other Ambulatory Visit: Payer: Self-pay | Admitting: Internal Medicine

## 2023-01-16 DIAGNOSIS — F419 Anxiety disorder, unspecified: Secondary | ICD-10-CM

## 2023-01-19 ENCOUNTER — Ambulatory Visit: Payer: Medicaid Other | Admitting: Internal Medicine

## 2023-01-21 NOTE — Progress Notes (Signed)
Initial neurology clinic note  Reason for Evaluation: Consultation requested by Hoy Register, MD for an opinion regarding burning foot pain. My final recommendations will be communicated back to the requesting physician by way of shared medical record or letter to requesting physician via [place] mail.  HPI: This is [name], a 42 y.o. left-handed female with a medical history of pre-DM, HTN, HLD, anxiety, former drug abuse, and current smoker who presents to neurology clinic with the chief complaint of foot pain. The patient is alone today.  Patient's symptoms started around 2017. She first noticed numbness and tingling in legs and hands. She also had cramping in her hands. The numbness and tingling was from calves down and hands bilaterally. The feet are worse than hands. The symptoms have been slowly progressive. The feet then started burning in the last couple of years. It is worse when she is laying in bed. She mentions her legs are restless in bed. When she gets up and walks, sometimes this makes her symptoms better. She finds it hard to keep shoes or socks on as a result of symptoms. She denies significant color change, warmth, or swelling of feet during symptoms.  She endorses imbalance. She will occasionally hit walls while walking and has rare falls. She endorses chronic low back pain and right sided sciatica.  Patient had an EMG and NMUS at [facility] and had EMG and NMUS on 01/01/22. I do not have these results, and patient does not well remember the results, but mentions being told something about small and large fibers, but again, does not remember the conversation. She did not have a skin biopsy.   For her symptoms, she was first put on gabapentin by PCP (600 mg TID). It worked at this dose but made her too sleepy. The dose was lowered but then it wouldn't work. At one point, [facility] had stopped gabapentin and started Lyrica (up to 50 mg) but this did  not help (was on it 2 months). She stopped Lyrica and went back on gabapentin. She is currently on gabapentin 300 mg TID but does not really work. She is also currently on Cymbalta 60 mg daily (increased since 12/23/22). This seems to be helping a little when taken with the gabapentin. She has not tried anything else including creams.  The patient does not report symptoms referable to autonomic dysfunction including impaired sweating, heat or cold intolerance, excessive mucosal dryness, gastroparetic early satiety, postprandial abdominal bloating, constipation, bowel or bladder dyscontrol, or syncope/presyncope/orthostatic intolerance.  She does not report any constitutional symptoms like fever, night sweats, anorexia or unintentional weight loss.  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 Normal Family history of neuropathy/myopathy/neurology disease? Mother, grandmother, great grandmother, and niece with similar symptoms (neuropathy)   MEDICATIONS:  Outpatient Encounter Medications as of 01/30/2023  Medication Sig   albuterol (VENTOLIN HFA) 108 (90 Base) MCG/ACT inhaler Inhale 2 puffs into the lungs every 6 (six) hours as needed for wheezing or shortness of breath.   cyclobenzaprine (FLEXERIL) 10 MG tablet TAKE 1 TABLET BY MOUTH AT BEDTIME   DULoxetine (CYMBALTA) 60 MG capsule Take 1 capsule (60 mg total) by mouth daily.   fluticasone (FLONASE) 50 MCG/ACT nasal spray Place 1 spray into both nostrils daily as needed for allergies or rhinitis.   hydrOXYzine (VISTARIL) 25 MG  capsule TAKE 1 CAPSULE BY MOUTH TWICE DAILY AS NEEDED   metFORMIN (GLUCOPHAGE) 500 MG tablet Take 1 tablet by mouth once daily with breakfast   ondansetron (ZOFRAN) 4 MG tablet Take 1 tablet (4 mg total) by mouth every 6 (six) hours.   spironolactone (ALDACTONE) 100 MG tablet Take 1 tablet  (100 mg total) by mouth daily.   [DISCONTINUED] gabapentin (NEURONTIN) 300 MG capsule TAKE 1 CAPSULE BY MOUTH THREE TIMES DAILY   benzonatate (TESSALON) 100 MG capsule Take 1 capsule (100 mg total) by mouth 2 (two) times daily as needed for cough. (Patient not taking: Reported on 01/30/2023)   gabapentin (NEURONTIN) 400 MG capsule Take 1 capsule (400 mg total) by mouth 3 (three) times daily.   mupirocin ointment (BACTROBAN) 2 % Apply 1 application. topically 2 (two) times daily. (Patient not taking: Reported on 01/30/2023)   No facility-administered encounter medications on file as of 01/30/2023.    PAST MEDICAL HISTORY: Past Medical History:  Diagnosis Date   Abnormal uterine bleeding (AUB)    Anxiety    Pre-diabetes     PAST SURGICAL HISTORY: Past Surgical History:  Procedure Laterality Date   CESAREAN SECTION  2004   CHOLECYSTECTOMY  2004   LAPAROSCOPIC UNILATERAL SALPINGO OOPHERECTOMY Right 11/15/2019   Procedure: LAPAROSCOPIC RIGHT SALPINGO OOPHORECTOMY AND RIGHT OVARIAN CYSTECTOMY;  Surgeon: Adam Phenix, MD;  Location: [facility];  Service: Gynecology;  Laterality: Right;    ALLERGIES: Allergies  Allergen Reactions   Dilaudid [Hydromorphone] Itching   Codeine Hives   Retin-A [Tretinoin] Other (See Comments)    Pt reports burning   Buspar [Buspirone]     Made her feel more depress   Tape Rash    FAMILY HISTORY: Family History  Problem Relation Age of Onset   Breast cancer Mother        94s    SOCIAL HISTORY: Social History   Tobacco Use   Smoking status: Every Day    Current packs/day: 0.50    Types: Cigarettes   Smokeless tobacco: Never   Tobacco comments:    2-3 cig a day  Vaping Use   Vaping status: Never Used  Substance Use Topics   Alcohol use: Not Currently   Drug use: Not Currently   Social History   Social History Narrative   Are you right handed or left handed? Left   Are you currently employed ?    What is your current  occupation? none   Do you live at home alone?   Who lives with you? husband   What type of home do you live in: 1 story or 2 story? one   Caffiene 2-3 Red Bulls      OBJECTIVE: PHYSICAL EXAM: BP 121/85   Pulse 97   Ht 5\' 8"  (1.727 m)   Wt 208 lb (94.3 kg)   SpO2 99%   BMI 31.63 kg/m   General: General appearance: Awake and alert. No distress. Cooperative with exam.  Skin: No obvious rash or jaundice. HEENT: Atraumatic. Anicteric. Lungs: Non-labored breathing on room air  Extremities: No edema. Psych: Affect appropriate.  Neurological: Mental Status: Alert. Speech fluent. No pseudobulbar affect Cranial Nerves: CNII: No RAPD. Visual fields grossly intact. CNIII, IV, VI: PERRL. No nystagmus. EOMI. CN V: Facial sensation intact bilaterally to fine touch. CN VII: Facial muscles symmetric and strong. No ptosis at rest. CN VIII: Hearing grossly intact bilaterally. CN IX: No hypophonia. CN X: Palate elevates symmetrically. CN XI: Full strength shoulder  shrug bilaterally. CN XII: Tongue protrusion full and midline. No atrophy or fasciculations. No significant dysarthria Motor: Tone is normal. Strength is 5/5 in bilateral upper and lower extremities Reflexes:  Right Left   Bicep 1+ 1+   Tricep 2+ 2+   BrRad 2+ 2+   Knee 3+ 3+   Ankle 3+ 3+ 4-5 beats of clonus in right ankle; 1-2 beats in left ankle   Pathological Reflexes: Babinski: mute response bilaterally Hoffman: absent bilaterally Troemner: absent bilaterally Sensation: Pinprick: Intact except in RLE to low calf (diminished) Vibration: Intact in all extremities Proprioception: Intact in bilateral great toes Coordination: Intact finger-to- nose-finger bilaterally. Romberg negative. Gait: Able to rise from chair with arms crossed unassisted. Normal, narrow-based gait. Able to walk on toes and heels.  Lab and Test Review: Internal labs: 12/23/22: B12: 650 BMP unremarkable  HbA1c (09/16/22): 5.5 [high of 6.2  in 2022]  TSH (03/12/22) wnl  External labs: 12/06/21: B12: 433 MMA wnl Folate wnl  Imaging: Left ankle xray (09/16/22): FINDINGS: No acute fracture or dislocation. Ankle mortise is preserved. Enthesophyte of the Achilles tendon. No area of erosion or osseous destruction. No unexpected radiopaque foreign body. Soft tissues are unremarkable.   IMPRESSION: Enthesophyte of the Achilles tendon.  Lumbar spine xray (08/07/20): FINDINGS: 11 mm anterolisthesis L5 on S1 with advanced degenerative change at L4-L5 and L5-S1. Chronic bilateral pars defect at L5. Vertebral body heights are normal. Remaining disc spaces are grossly patent. Facet degenerative change of the lower lumbar spine.   IMPRESSION: 1. No acute osseous abnormality. 2. Similar 11 mm anterolisthesis L5 on S1 with bilateral pars defect at L5. Advanced degenerative changes at L4-L5 and L5-S1.  ASSESSMENT: Heidi Bartlett is a 42 y.o. female who presents for evaluation of burning of feet and numbness and tingling in feet and hands. She has a relevant medical history of pre-DM, HTN, HLD, anxiety, former drug abuse, and current smoker. Her neurological examination is pertinent for diminished sensation to pinprick in right foot and hyperreflexia in lower extremities (right >> left). Available diagnostic data is significant for HbA1c 5.5, normal folate, B12, and MMA. Lumbar spine xray in 2022 showed advanced degenerative changes at L4-5 and L5-S1. She also had at least an EMG and NMUS at [facility] previously, maybe even a skin biopsy, but I do not have these results. 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 The etiology is unclear, but a spinal cord pathology is possible. I will work up as below.  PLAN: -Blood work: B1, copper, vit E, IFE -Get  records: EMG/NMUS, or skin biopsy from [facility] -MRI cervical and thoracic spine w/wo contrast -Will consider skin biopsy if other work up is negative and this has not been done previously For neuropathy: -Continue Cymbalta 60 mg daily -Increase: Gabapentin 400 mg TID -Lidocaine cream PRN -Alpha lipoic acid 600 mg once or twice daily  -Return to clinic 6 months  The impression above as well as the plan as outlined below were extensively discussed with the patient who voiced understanding. All questions were answered to their satisfaction.  The patient was counseled on pertinent fall precautions per the printed material provided today, and as noted under the "Patient Instructions" section below.  When available, results of the above investigations and possible further recommendations will be communicated to the patient via telephone/MyChart. Patient to call  office if not contacted after expected testing turnaround time.   Total time spent reviewing records, interview, history/exam, documentation, and coordination of care on day of encounter:  65 min   Thank you for allowing me to participate in patient's care.  If I can answer any additional questions, I would be pleased to do so.  Jacquelyne Balint, MD   CC: Marcine Matar, MD [address] [place] [address] [place] [address]  CC: Referring provider: Hoy Register, MD [address] [place] [address] [place],  [place] [address]

## 2023-01-30 ENCOUNTER — Other Ambulatory Visit: Payer: Medicaid Other

## 2023-01-30 ENCOUNTER — Other Ambulatory Visit: Payer: Self-pay | Admitting: Internal Medicine

## 2023-01-30 ENCOUNTER — Encounter: Payer: Self-pay | Admitting: Neurology

## 2023-01-30 ENCOUNTER — Ambulatory Visit: Payer: Medicaid Other | Admitting: Neurology

## 2023-01-30 VITALS — BP 121/85 | HR 97 | Ht 68.0 in | Wt 208.0 lb

## 2023-01-30 DIAGNOSIS — R2689 Other abnormalities of gait and mobility: Secondary | ICD-10-CM

## 2023-01-30 DIAGNOSIS — R292 Abnormal reflex: Secondary | ICD-10-CM

## 2023-01-30 DIAGNOSIS — R200 Anesthesia of skin: Secondary | ICD-10-CM

## 2023-01-30 DIAGNOSIS — G629 Polyneuropathy, unspecified: Secondary | ICD-10-CM

## 2023-01-30 DIAGNOSIS — R202 Paresthesia of skin: Secondary | ICD-10-CM | POA: Diagnosis not present

## 2023-01-30 MED ORDER — GABAPENTIN 400 MG PO CAPS: 400.0000 mg | ORAL_CAPSULE | Freq: Three times a day (TID) | ORAL | 5 refills | Status: DC

## 2023-01-30 NOTE — Patient Instructions (Addendum)
I saw you today for burning in your feet and hands. Your symptoms sound like nerve damage (neuropathy). I want to get records from [facility] to confirm what they have found on previous testing about this. If they did not do a skin biopsy for small fiber neuropathy, we may think about doing this in the future.  I would like to get an MRI of your spine due to some increase in reflexes to make sure there is no problems with your spine contributing to your problems.  I also want to get lab work today.  For your symptoms: -Continue Cymbalta 60 mg daily -Increase your gabapentin to 400 mg three times per day (I sent a new prescription to your pharmacy.  You can also try Lidocaine cream as needed. Apply wear you have pain, tingling, or burning. Wear gloves to prevent your hands being numb. This can be bought over the counter at any drug store or online.  Alpha lipoic acid 600mg  daily has some research data suggesting it helps with nerve health. No major side effects other than <1% of people report upset stomach. This can be taken twice per day (1200mg  daily) if no relief obtained. You can buy this over the counter or online.  I will be in touch when I have your results to discuss next steps. I will see you back in clinic in 6 months. Please let me know if you have any questions or concerns in the meantime.   The physicians and staff at [facility] are committed to providing excellent care. You may receive a survey requesting feedback about your experience at our office. We strive to receive "very good" responses to the survey questions. If you feel that your experience would prevent you from giving the office a "very good " response, please contact our office to try to remedy the situation. We may be reached at [phone]. Thank you for taking the time out of your busy day to complete the survey.  Jacquelyne Balint, MD [facility]  Preventing Falls at [facility] are common, often dreaded  events in the lives of older people. Aside from the obvious injuries and even death that may result, fall can cause wide-ranging consequences including loss of independence, mental decline, decreased activity and mobility. Younger people are also at risk of falling, especially those with chronic illnesses and fatigue.  Ways to reduce risk for falling Examine diet and medications. Warm foods and alcohol dilate blood vessels, which can lead to dizziness when standing. Sleep aids, antidepressants and pain medications can also increase the likelihood of a fall.  Get a vision exam. Poor vision, cataracts and glaucoma increase the chances of falling.  Check foot gear. Shoes should fit snugly and have a sturdy, nonskid sole and a broad, low heel  Participate in a physician-approved exercise program to build and maintain muscle strength and improve balance and coordination. Programs that use ankle weights or stretch bands are excellent for muscle-strengthening. Water aerobics programs and low-impact Tai Chi programs have also been shown to improve balance and coordination.  Increase vitamin D intake. Vitamin D improves muscle strength and increases the amount of calcium the body is able to absorb and deposit in bones.  How to prevent falls from common hazards Floors - Remove all loose wires, cords, and throw rugs. Minimize clutter. Make sure rugs are anchored and smooth. Keep furniture in its usual place.  Chairs -- Use chairs with straight backs, armrests and firm seats. Add firm cushions  to existing pieces to add height.  Bathroom - Install grab bars and non-skid tape in the tub or shower. Use a bathtub transfer bench or a shower chair with a back support Use an elevated toilet seat and/or safety rails to assist standing from a low surface. Do not use towel racks or bathroom tissue holders to help you stand.  Lighting - Make sure halls, stairways, and entrances are well-lit. Install a night light in  your bathroom or hallway. Make sure there is a light switch at the top and bottom of the staircase. Turn lights on if you get up in the middle of the night. Make sure lamps or light switches are within reach of the bed if you have to get up during the night.  Kitchen - Install non-skid rubber mats near the sink and stove. Clean spills immediately. Store frequently used utensils, pots, pans between waist and eye level. This helps prevent reaching and bending. Sit when getting things out of lower cupboards.  Living room/ Bedrooms - Place furniture with wide spaces in between, giving enough room to move around. Establish a route through the living room that gives you something to hold onto as you walk.  Stairs - Make sure treads, rails, and rugs are secure. Install a rail on both sides of the stairs. If stairs are a threat, it might be helpful to arrange most of your activities on the lower level to reduce the number of times you must climb the stairs.  Entrances and doorways - Install metal handles on the walls adjacent to the doorknobs of all doors to make it more secure as you travel through the doorway.  Tips for maintaining balance Keep at least one hand free at all times. Try using a backpack or fanny pack to hold things rather than carrying them in your hands. Never carry objects in both hands when walking as this interferes with keeping your balance.  Attempt to swing both arms from front to back while walking. This might require a conscious effort if Parkinson's disease has diminished your movement. It will, however, help you to maintain balance and posture, and reduce fatigue.  Consciously lift your feet off of the ground when walking. Shuffling and dragging of the feet is a common culprit in losing your balance.  When trying to navigate turns, use a "U" technique of facing forward and making a wide turn, rather than pivoting sharply.  Try to stand with your feet shoulder-length apart. When  your feet are close together for any length of time, you increase your risk of losing your balance and falling.  Do one thing at a time. Don't try to walk and accomplish another task, such as reading or looking around. The decrease in your automatic reflexes complicates motor function, so the less distraction, the better.  Do not wear rubber or gripping soled shoes, they might "catch" on the floor and cause tripping.  Move slowly when changing positions. Use deliberate, concentrated movements and, if needed, use a grab bar or walking aid. Count 15 seconds between each movement. For example, when rising from a seated position, wait 15 seconds after standing to begin walking.  If balance is a continuous problem, you might want to consider a walking aid such as a cane, walking stick, or walker. Once you've mastered walking with help, you might be ready to try it on your own again.

## 2023-02-05 ENCOUNTER — Encounter: Payer: Self-pay | Admitting: Neurology

## 2023-02-05 LAB — COPPER, SERUM: Copper: 133 ug/dL (ref 70–175)

## 2023-02-05 LAB — IMMUNOFIXATION ELECTROPHORESIS
IgG (Immunoglobin G), Serum: 1006 mg/dL (ref 600–1640)
IgM, Serum: 62 mg/dL (ref 50–300)
Immunoglobulin A: 148 mg/dL (ref 47–310)

## 2023-02-05 LAB — VITAMIN E
Gamma-Tocopherol (Vit E): 2 mg/L (ref <4.4)
Vitamin E (Alpha Tocopherol): 15 mg/L (ref 5.7–19.9)

## 2023-02-05 LAB — VITAMIN B1: Vitamin B1 (Thiamine): 13 nmol/L (ref 8–30)

## 2023-02-15 ENCOUNTER — Other Ambulatory Visit: Payer: Self-pay | Admitting: Internal Medicine

## 2023-02-15 DIAGNOSIS — G8929 Other chronic pain: Secondary | ICD-10-CM

## 2023-02-17 ENCOUNTER — Encounter: Payer: Self-pay | Admitting: Neurology

## 2023-02-23 ENCOUNTER — Other Ambulatory Visit: Payer: Medicaid Other

## 2023-03-11 ENCOUNTER — Other Ambulatory Visit: Payer: Medicaid Other

## 2023-03-30 ENCOUNTER — Encounter: Payer: Self-pay | Admitting: Internal Medicine

## 2023-03-30 ENCOUNTER — Ambulatory Visit: Payer: Medicaid Other | Attending: Internal Medicine | Admitting: Internal Medicine

## 2023-03-30 VITALS — BP 118/81 | HR 90 | Temp 98.1°F | Ht 68.0 in | Wt 209.0 lb

## 2023-03-30 DIAGNOSIS — J069 Acute upper respiratory infection, unspecified: Secondary | ICD-10-CM | POA: Diagnosis not present

## 2023-03-30 DIAGNOSIS — I10 Essential (primary) hypertension: Secondary | ICD-10-CM

## 2023-03-30 DIAGNOSIS — G629 Polyneuropathy, unspecified: Secondary | ICD-10-CM | POA: Diagnosis not present

## 2023-03-30 DIAGNOSIS — R42 Dizziness and giddiness: Secondary | ICD-10-CM | POA: Diagnosis not present

## 2023-03-30 DIAGNOSIS — R55 Syncope and collapse: Secondary | ICD-10-CM | POA: Diagnosis not present

## 2023-03-30 DIAGNOSIS — F17210 Nicotine dependence, cigarettes, uncomplicated: Secondary | ICD-10-CM

## 2023-03-30 DIAGNOSIS — R233 Spontaneous ecchymoses: Secondary | ICD-10-CM

## 2023-03-30 DIAGNOSIS — F17200 Nicotine dependence, unspecified, uncomplicated: Secondary | ICD-10-CM

## 2023-03-30 MED ORDER — BENZONATATE 100 MG PO CAPS: 100.0000 mg | ORAL_CAPSULE | Freq: Three times a day (TID) | ORAL | 0 refills | Status: DC | PRN

## 2023-03-30 MED ORDER — DULOXETINE HCL 60 MG PO CPEP: 60.0000 mg | ORAL_CAPSULE | Freq: Every day | ORAL | 1 refills | Status: DC

## 2023-03-30 NOTE — Progress Notes (Signed)
 Patient ID: Heidi Bartlett, female    DOB: Nov 11, 1980  MRN: [id number]  CC: Hypertension (HTN f/u. /Fainting episode yesterday, dizziness/Congestion, sinus issues x2 weeks)   Subjective: Heidi Bartlett is a 43 y.o. female who presents for chronic ds management. Her concerns today include:  History of HTN, obesity/preDM, hep C treated,  tob dep, chronic right-sided lower back pain, DUB/PCOS, preDM, GAD/MDD, small fiber neuropathy ([facility] neurology fall 2023).   Discussed the use of AI scribe software for clinical note transcription with the patient, who gave verbal consent to proceed.  History of Present Illness   The patient, with a history of hypertension managed with spironolactone , presents with a fainting episode yesterday while leaving out of a friend's door.  She felt dizzy and fell on her knees, with no recollection of the event.  Is not sure if she lost consciousness.  Her friend helped her back up.  There was no incontinence or shaking, and no chest pain or palpitations. The patient has been experiencing intermittent dizziness for a couple of months, which she describes as her head spinning.  Normally drinks four 16 ounce bottle of water daily  In addition to the dizziness and fainting, the patient reports balance issues, frequently bumping into walls.  She she has also noticed easy bruising on the extremities at times.   Saw Dr. Genita Keys, neurologist for neuropathy symptoms in her feet.  He changed gabapentin  to 400 mg 3 times a day.  Cymbalta  is now at 60 mg daily.  She reports that the burning sensation in her feet persist despite increased dose of gabapentin .  The patient is also on Cymbalta  for depression and anxiety, which initially helped with the neuropathy but is now less effective.  Dr. Genita Keys has ordered MRI of the cervical and thoracic spine.  Appointment has been canceled twice first because of this no and then second time because she was not feeling well that day.  She has  been trying to call them back to reschedule.  The patient has been experiencing sinus issues, with a stuffy nose and cough producing green mucus. She denies having a fever or drainage at the back of the throat.  Tobacco: The patient is a current smoker, smoking 3-5 cigarettes a day, and is attempting to quit without assistance.      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 6.7 g 0   cyclobenzaprine  (FLEXERIL ) 10 MG tablet TAKE 1 TABLET BY MOUTH AT BEDTIME 90 tablet 0   fluticasone  (FLONASE ) 50 MCG/ACT nasal spray Place 1 spray into both nostrils daily as needed for allergies or rhinitis. 16 g 1   gabapentin  (NEURONTIN ) 400 MG capsule Take 1 capsule (400 mg total) by mouth 3 (three) times  daily. 90 capsule 5   hydrOXYzine  (VISTARIL ) 25 MG capsule TAKE 1 CAPSULE BY MOUTH TWICE DAILY AS NEEDED 180 capsule 0   metFORMIN  (GLUCOPHAGE ) 500 MG tablet Take 1 tablet by mouth once daily with breakfast 90 tablet 1   mupirocin  ointment (BACTROBAN ) 2 % Apply 1 application. topically 2 (two) times daily. 22 g 0   ondansetron  (ZOFRAN ) 4 MG tablet Take 1 tablet (4 mg total) by mouth every 6 (six) hours. 12 tablet  0   spironolactone  (ALDACTONE ) 100 MG tablet Take 1 tablet (100 mg total) by mouth daily. 90 tablet 1   No current facility-administered medications on file prior to visit.    Allergies  Allergen Reactions   Dilaudid  [Hydromorphone ] Itching   Codeine Hives   Retin-A  [Tretinoin ] Other (See Comments)    Pt reports burning   Buspar  [Buspirone ]     Made her feel more depress   Tape Rash    Social History   Socioeconomic History   Marital status: Married    Spouse name: Not on file   Number of children: 0   Years of education: 9th grade   Highest education level: 10th grade  Occupational History   Occupation: unemployed  Tobacco Use   Smoking status: Every Day    Current packs/day: 0.50    Types: Cigarettes   Smokeless tobacco: Never   Tobacco comments:    2-3 cig a day  Vaping Use   Vaping status: Never Used  Substance and Sexual Activity   Alcohol use: Not Currently   Drug use: Not Currently   Sexual activity: Yes    Birth control/protection: None  Other Topics Concern   Not on file  Social History Narrative   Are you right handed or left handed? Left   Are you currently employed ?    What is your current occupation? none   Do you live at home alone?   Who lives with you? husband   What type of home do you live in: 1 story or 2 story? one   Caffiene 2-3 Red Bulls    Social Drivers of Health   Financial Resource Strain: Low Risk  (03/27/2023)   Overall Financial Resource Strain (CARDIA)    Difficulty of Paying Living Expenses: Not hard at all  Food Insecurity: No Food Insecurity (03/27/2023)   Hunger Vital Sign    Worried About Running Out of Food in the Last Year: Never true    Ran Out of Food in the Last Year: Never true  Transportation Needs: No Transportation Needs (03/27/2023)   PRAPARE - Administrator, Civil Service (Medical): No    Lack of Transportation (Non-Medical): No  Physical Activity: Insufficiently Active (03/27/2023)   Exercise Vital  Sign    Days of Exercise per Week: 3 days    Minutes of Exercise per Session: 30 min  Stress: No Stress Concern Present (03/27/2023)   [employer] of Occupational Health - Occupational Stress Questionnaire    Feeling of Stress : Not at all  Recent Concern: Stress - Stress Concern Present (01/15/2023)   [employer] of Occupational Health - Occupational Stress Questionnaire    Feeling of Stress : To some extent  Social Connections: Socially Isolated (03/27/2023)   Social Connection and Isolation Panel [NHANES]    Frequency of Communication with Friends and Family: Once a week    Frequency of Social Gatherings with Friends and Family: Once a week    Attends Religious Services: Never  Active Member of Clubs or Organizations: No    Attends Banker Meetings: Not on file    Marital Status: Separated  Intimate Partner Violence: Not At Risk (12/23/2022)   Humiliation, Afraid, Rape, and Kick questionnaire    Fear of Current or Ex-Partner: No    Emotionally Abused: No    Physically Abused: No    Sexually Abused: No    Family History  Problem Relation Age of Onset   Breast cancer Mother        78s    Past Surgical History:  Procedure Laterality Date   CESAREAN SECTION  2004   CHOLECYSTECTOMY  2004   LAPAROSCOPIC UNILATERAL SALPINGO OOPHERECTOMY Right 11/15/2019   Procedure: LAPAROSCOPIC RIGHT SALPINGO OOPHORECTOMY AND RIGHT OVARIAN CYSTECTOMY;  Surgeon: Tresia Fruit, MD;  Location: [facility];  Service: Gynecology;  Laterality: Right;    ROS: Review of Systems Negative except as stated above  PHYSICAL EXAM: BP 118/81 (BP Location: Left Arm, Patient Position: Sitting, Cuff Size: Normal)   Pulse 90   Temp 98.1 F (36.7 C) (Oral)   Ht 5\' 8"  (1.727 m)   Wt 209 lb (94.8 kg)   SpO2 98%   BMI 31.78 kg/m   Wt Readings from Last 3 Encounters:  03/30/23 209 lb (94.8 kg)  01/30/23 208 lb (94.3 kg)  12/23/22 216 lb 3.2 oz (98.1 kg)  Sitting: BP  112/85, P78 Standing: BP 114/78, P of 87  Physical Exam  General appearance - alert, well appearing, and in no distress Mental status - normal mood, behavior, speech, dress, motor activity, and thought processes Eyes -slightly pale conjunctiva Chest - clear to auscultation, no wheezes, rales or rhonchi, symmetric air entry Heart - normal rate, regular rhythm, normal S1, S2, no murmurs, rubs, clicks or gallops Neurological -power is 5/5 in all fours. Extremities - peripheral pulses normal, no pedal edema, no clubbing or cyanosis Skin -no bruising seen at this time on the extremities  The 10-year ASCVD risk score (Arnett DK, et al., 2019) is: 5%   Values used to calculate the score:     Age: 44 years     Sex: Female     Is Non-Hispanic African American: No     Diabetic: No     Tobacco smoker: Yes     Systolic Blood Pressure: 118 mmHg     Is BP treated: Yes     HDL Cholesterol: 48 mg/dL     Total Cholesterol: 236 mg/dL     Latest Ref Rng & Units 12/23/2022    4:33 PM 03/12/2022   10:17 AM 09/09/2021   10:51 AM  CMP  Glucose 70 - 99 mg/dL 76  95  161   BUN 6 - 24 mg/dL 7  11  9    Creatinine 0.57 - 1.00 mg/dL 0.96  0.45  4.09   Sodium 134 - 144 mmol/L 143  145  139   Potassium 3.5 - 5.2 mmol/L 4.9  4.3  3.5   Chloride 96 - 106 mmol/L 105  106  104   CO2 20 - 29 mmol/L 28  22  23    Calcium  8.7 - 10.2 mg/dL 9.4  9.1  9.3   Total Protein 6.1 - 8.1 g/dL   7.0   Total Bilirubin 0.2 - 1.2 mg/dL   0.4   AST 10 - 30 U/L   22   ALT 6 - 29 U/L   27    Lipid Panel  Component Value Date/Time   CHOL 236 (H) 04/30/2021 1108   TRIG 159 (H) 04/30/2021 1108   HDL 48 04/30/2021 1108   LDLCALC 159 (H) 04/30/2021 1108    CBC    Component Value Date/Time   WBC 8.3 03/12/2022 1017   WBC 8.5 12/02/2021 0922   RBC 4.60 03/12/2022 1017   RBC 4.61 12/02/2021 0922   HGB 13.4 03/12/2022 1017   HCT 40.8 03/12/2022 1017   PLT 158 03/12/2022 1017   MCV 89 03/12/2022 1017   MCH 29.1  03/12/2022 1017   MCH 30.2 12/02/2021 0922   MCHC 32.8 03/12/2022 1017   MCHC 34.1 12/02/2021 0922   RDW 12.6 03/12/2022 1017   LYMPHSABS 2.5 03/12/2022 1017   MONOABS 0.5 01/10/2017 0929   EOSABS 0.3 03/12/2022 1017   BASOSABS 0.1 03/12/2022 1017    ASSESSMENT AND PLAN: 1. Essential hypertension (Primary) At goal.  Continue spironolactone  - Hepatic function panel - Lipid panel  2. Small fiber neuropathy Patient will keep follow-up appointment with neurology.  Advised her to call [facility] imaging and reschedule the MRI of the cervical and thoracic spine that he has ordered. - DULoxetine  (CYMBALTA ) 60 MG capsule; Take 1 capsule (60 mg total) by mouth daily.  Dispense: 90 capsule; Refill: 1  3. Near syncope Advised to go slow with position changes; getting meals on time.  We will check CBC today.  Whenever she feels dizzy she should sit down and cross the legs or lie down and elevate the legs.  Follow-up if she has any recurrent episodes..  4. Dizziness See #3 above.  5. Tobacco dependence Commended her on trying to quit.  Encouraged her to do so.  She declines any help at this time  6. Easy bruising No bruising seen on the body at this time. - CBC - PT AND PTT  7. Viral upper respiratory tract infection - benzonatate  (TESSALON  PERLES) 100 MG capsule; Take 1 capsule (100 mg total) by mouth 3 (three) times daily as needed.  Dispense: 20 capsule; Refill: 0    Patient was given the opportunity to ask questions.  Patient verbalized understanding of the plan and was able to repeat key elements of the plan.   This documentation was completed using Paediatric nurse.  Any transcriptional errors are unintentional.  Orders Placed This Encounter  Procedures   CBC   Hepatic function panel   Lipid panel   PT AND PTT     Requested Prescriptions   Signed Prescriptions Disp Refills   DULoxetine  (CYMBALTA ) 60 MG capsule 90 capsule 1    Sig: Take 1 capsule  (60 mg total) by mouth daily.   benzonatate  (TESSALON  PERLES) 100 MG capsule 20 capsule 0    Sig: Take 1 capsule (100 mg total) by mouth 3 (three) times daily as needed.    Return in about 4 months (around 07/28/2023).  Concetta Dee, MD, FACP

## 2023-03-30 NOTE — Patient Instructions (Signed)
 Go slow with position changes.  Continue to drink at least 4 to 8 glasses of water daily.

## 2023-03-31 ENCOUNTER — Encounter: Payer: Self-pay | Admitting: Internal Medicine

## 2023-03-31 LAB — HEPATIC FUNCTION PANEL
ALT: 14 [IU]/L (ref 0–32)
AST: 17 [IU]/L (ref 0–40)
Albumin: 4.9 g/dL (ref 3.9–4.9)
Alkaline Phosphatase: 75 [IU]/L (ref 44–121)
Bilirubin Total: 0.3 mg/dL (ref 0.0–1.2)
Bilirubin, Direct: 0.1 mg/dL (ref 0.00–0.40)
Total Protein: 7.8 g/dL (ref 6.0–8.5)

## 2023-03-31 LAB — LIPID PANEL
Chol/HDL Ratio: 4.3 {ratio} (ref 0.0–4.4)
Cholesterol, Total: 285 mg/dL — ABNORMAL HIGH (ref 100–199)
HDL: 67 mg/dL (ref >39)
LDL Chol Calc (NIH): 191 mg/dL — ABNORMAL HIGH (ref 0–99)
Triglycerides: 150 mg/dL — ABNORMAL HIGH (ref 0–149)
VLDL Cholesterol Cal: 27 mg/dL (ref 5–40)

## 2023-03-31 LAB — CBC
Hematocrit: 46.8 % — ABNORMAL HIGH (ref 34.0–46.6)
Hemoglobin: 15.1 g/dL (ref 11.1–15.9)
MCH: 28.9 pg (ref 26.6–33.0)
MCHC: 32.3 g/dL (ref 31.5–35.7)
MCV: 90 fL (ref 79–97)
Platelets: 274 10*3/uL (ref 150–450)
RBC: 5.23 x10E6/uL (ref 3.77–5.28)
RDW: 13.5 % (ref 11.7–15.4)
WBC: 9.9 10*3/uL (ref 3.4–10.8)

## 2023-03-31 LAB — PT AND PTT
INR: 1 (ref 0.9–1.2)
Prothrombin Time: 11.6 s (ref 9.1–12.0)
aPTT: 25 s (ref 24–33)

## 2023-04-17 ENCOUNTER — Ambulatory Visit
Admission: RE | Admit: 2023-04-17 | Discharge: 2023-04-17 | Disposition: A | Discharge status: Home / Self Care | Payer: Medicaid Other | Source: Ambulatory Visit | Attending: Neurology | Admitting: Neurology

## 2023-04-17 DIAGNOSIS — R2689 Other abnormalities of gait and mobility: Secondary | ICD-10-CM

## 2023-04-17 DIAGNOSIS — G629 Polyneuropathy, unspecified: Secondary | ICD-10-CM

## 2023-04-17 DIAGNOSIS — R292 Abnormal reflex: Secondary | ICD-10-CM

## 2023-04-17 DIAGNOSIS — R200 Anesthesia of skin: Secondary | ICD-10-CM

## 2023-04-17 MED ORDER — GADOPICLENOL 0.5 MMOL/ML IV SOLN
9.0000 mL | Freq: Once | INTRAVENOUS | Status: AC | PRN
  Administered 2023-04-17: 9 mL via INTRAVENOUS

## 2023-05-06 ENCOUNTER — Other Ambulatory Visit: Payer: Self-pay | Admitting: Internal Medicine

## 2023-05-06 DIAGNOSIS — N938 Other specified abnormal uterine and vaginal bleeding: Secondary | ICD-10-CM

## 2023-05-06 DIAGNOSIS — R7303 Prediabetes: Secondary | ICD-10-CM

## 2023-05-06 DIAGNOSIS — E66812 Obesity, class 2: Secondary | ICD-10-CM

## 2023-05-11 ENCOUNTER — Telehealth: Payer: Self-pay | Admitting: Neurology

## 2023-05-11 NOTE — Telephone Encounter (Signed)
 Attempted to call patient regarding MRI cervical and thoracic spine. I left a message and will also send a MyChart message. They is moderate cervical spine stenosis that may account for her hyperreflexia. I think it is probably a good idea to see spine to make sure there is not something that needs intervention.  Heidi Balint, MD [facility]

## 2023-05-12 ENCOUNTER — Other Ambulatory Visit: Payer: Self-pay

## 2023-05-12 DIAGNOSIS — M4802 Spinal stenosis, cervical region: Secondary | ICD-10-CM

## 2023-05-12 DIAGNOSIS — M5412 Radiculopathy, cervical region: Secondary | ICD-10-CM

## 2023-05-23 ENCOUNTER — Encounter: Payer: Self-pay | Admitting: Neurology

## 2023-05-26 ENCOUNTER — Other Ambulatory Visit: Payer: Self-pay | Admitting: Neurology

## 2023-05-26 DIAGNOSIS — G629 Polyneuropathy, unspecified: Secondary | ICD-10-CM

## 2023-05-26 MED ORDER — GABAPENTIN 300 MG PO CAPS: 600.0000 mg | ORAL_CAPSULE | Freq: Three times a day (TID) | ORAL | 5 refills | Status: DC

## 2023-06-01 ENCOUNTER — Other Ambulatory Visit: Payer: Self-pay | Admitting: Internal Medicine

## 2023-06-01 DIAGNOSIS — G8929 Other chronic pain: Secondary | ICD-10-CM

## 2023-06-01 NOTE — Telephone Encounter (Signed)
 Copied from CRM [id number]. Topic: Clinical - Medication Refill >> Jun 01, 2023  1:05 PM Leory Rands wrote: Most Recent Primary Care Visit:  Provider: Concetta Dee B  Department: [facility]  Visit Type: OFFICE VISIT  Date: 03/30/2023  Medication: cyclobenzaprine (FLEXERIL) 10 MG tablet [[id number]]  Has the patient contacted their pharmacy? Yes (Agent: If no, request that the patient contact the pharmacy for the refill. If patient does not wish to contact the pharmacy document the reason why and proceed with request.) (Agent: If yes, when and what did the pharmacy advise?)  Is this the correct pharmacy for this prescription? Yes If no, delete pharmacy and type the correct one.  This is the patient's preferred pharmacy:  [id number] - [place] - [address] [place]) [place] [address] Phone: [phone] Fax: [phone]  [facility] [address] [place] [address] Phone: [phone] Fax: [phone]  [id number] [place], [place] - [address] [place] [address] Phone: [phone] Fax: [phone]  [facility] [place] - [facility] [address] [place] [address] Phone: [phone] Fax: [phone]   Has the prescription been filled recently? Yes  Is the patient out of the medication? Yes  Has the patient been seen for an appointment in the last year OR does the patient have an upcoming appointment? Yes  Can we respond through MyChart? Yes  Agent: Please be advised that Rx refills may take up to 3 business days. We ask that you follow-up with your pharmacy.

## 2023-06-02 NOTE — Telephone Encounter (Signed)
 Requested medications are due for refill today.  yes  Requested medications are on the active medications list.  yes  Last refill. 02/16/2023 #90 0 rf  Future visit scheduled.   yes  Notes to clinic.  Refill not delegated.    Requested Prescriptions  Pending Prescriptions Disp Refills   cyclobenzaprine (FLEXERIL) 10 MG tablet 90 tablet 0    Sig: Take 1 tablet (10 mg total) by mouth at bedtime.     Not Delegated - Analgesics:  Muscle Relaxants Failed - 06/02/2023  2:05 PM      Failed - This refill cannot be delegated      Passed - Valid encounter within last 6 months    Recent Outpatient Visits           2 months ago Essential hypertension   [facility] [place]. [facility] Lawrance Presume, MD   5 months ago Prediabetes   [facility] [place]. [facility] Joaquin Mulberry, MD   7 months ago Acute non-recurrent maxillary sinusitis   [facility] [place]. [facility] Vernell Goldsmith, MD   8 months ago Small fiber neuropathy   [facility] [place]. [facility] Lawrance Presume, MD   10 months ago Essential hypertension   [facility] [place]. [facility] Lawrance Presume, MD       Future Appointments             In 1 month Lincoln Renshaw Rexine Cater, MD [facility] [place] [facility] [place]. [facility]

## 2023-06-03 MED ORDER — CYCLOBENZAPRINE HCL 10 MG PO TABS: 10.0000 mg | ORAL_TABLET | Freq: Every day | ORAL | 0 refills | Status: DC

## 2023-06-30 ENCOUNTER — Ambulatory Visit: Admitting: Podiatry

## 2023-07-01 ENCOUNTER — Encounter: Payer: Self-pay | Admitting: Podiatry

## 2023-07-01 ENCOUNTER — Ambulatory Visit (INDEPENDENT_AMBULATORY_CARE_PROVIDER_SITE_OTHER)

## 2023-07-01 ENCOUNTER — Ambulatory Visit (INDEPENDENT_AMBULATORY_CARE_PROVIDER_SITE_OTHER): Admitting: Podiatry

## 2023-07-01 VITALS — Ht 68.0 in | Wt 209.0 lb

## 2023-07-01 DIAGNOSIS — M7662 Achilles tendinitis, left leg: Secondary | ICD-10-CM | POA: Diagnosis not present

## 2023-07-01 MED ORDER — TRIAMCINOLONE ACETONIDE 10 MG/ML IJ SUSP
10.0000 mg | Freq: Once | INTRAMUSCULAR | Status: AC
  Administered 2023-07-01: 10 mg via INTRA_ARTICULAR

## 2023-07-02 NOTE — Progress Notes (Signed)
 Subjective:   Patient ID: Heidi Bartlett, female   DOB: 43 y.o.   MRN: [id number]   HPI Patient states she did great for about 8 months but recently has had reoccurrence of pain in the back of the left heel.  States that she did wear shoes that were probably not correct and that probably flared this up neurovascular   ROS      Objective:  Physical Exam  Status intact with inflammation pain of the lateral posterior heel insertion of the Achilles tendon mild equinus condition no other pathology good muscle strength acute Achilles tendinitis left foot doing well with previous treatment with long shoe gear probably causing symptoms H&P     Assessment:  X-ray reviewed discussed treatment options discussed possibility of surgery she absolutely does not want this unless necessary and I discussed injection explaining risk.  Today I went ahead I did sterile prep I injected the posterior left heel I did explain risk of this and possibility for rupture and the possibility for surgery someday.  Patient tolerated the procedure well     Plan:  Above describes plan I did review her x-rays indicating spur formation posterior left heel and the importance of wearing elevated heels

## 2023-07-24 NOTE — Progress Notes (Deleted)
 I saw Heidi Bartlett in neurology clinic on 07/31/23 in follow up for burning pain in feet.  HPI: Heidi Bartlett is a 43 y.o. year old female with a history of pre-DM, HTN, HLD, anxiety, former drug abuse, and current smoker who we last saw on 01/30/23.  To briefly review: 01/30/23: Patient's symptoms started around 2017. She first noticed numbness and tingling in legs and hands. She also had cramping in her hands. The numbness and tingling was from calves down and hands bilaterally. The feet are worse than hands. The symptoms have been slowly progressive. The feet then started burning in the last couple of years. It is worse when she is laying in bed. She mentions her legs are restless in bed. When she gets up and walks, sometimes this makes her symptoms better. She finds it hard to keep shoes or socks on as a result of symptoms. She denies significant color change, warmth, or swelling of feet during symptoms.   She endorses imbalance. She will occasionally hit walls while walking and has rare falls. She endorses chronic low back pain and right sided sciatica.   Patient had an EMG and NMUS at [facility] and had EMG and NMUS on 01/01/22. I do not have these results, and patient does not well remember the results, but mentions being told something about small and large fibers, but again, does not remember the conversation. She did not have a skin biopsy.    For her symptoms, she was first put on gabapentin  by PCP (600 mg TID). It worked at this dose but made her too sleepy. The dose was lowered but then it wouldn't work. At one point, [facility] had stopped gabapentin  and started Lyrica (up to 50 mg) but this did not help (was on it 2 months). She stopped Lyrica and went back on gabapentin . She is currently on gabapentin  300 mg TID but does not really work. She is also currently on Cymbalta  60 mg daily (increased since 12/23/22). This seems to be helping a little when taken  with the gabapentin . She has not tried anything else including creams.   The patient does not report symptoms referable to autonomic dysfunction including impaired sweating, heat or cold intolerance, excessive mucosal dryness, gastroparetic early satiety, postprandial abdominal bloating, constipation, bowel or bladder dyscontrol, or syncope/presyncope/orthostatic intolerance.   She does not report any constitutional symptoms like fever, night sweats, anorexia or unintentional weight loss.   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 Normal Family history of neuropathy/myopathy/neurology disease? Mother, grandmother, great grandmother, and niece with similar symptoms (neuropathy)  Most recent Assessment and Plan (01/30/23): Heidi Bartlett is a 43 y.o. female who presents for evaluation of burning of feet and numbness and tingling in feet and hands. She has a relevant medical history of pre-DM, HTN, HLD, anxiety, former drug abuse, and current smoker. Her neurological examination is pertinent for diminished sensation to pinprick in right foot and hyperreflexia in lower extremities (right >> left). Available diagnostic data is significant for HbA1c 5.5, normal folate, B12, and MMA. Lumbar spine xray in 2022 showed advanced degenerative changes at L4-5 and L5-S1. She also had at least an EMG and NMUS at [facility] previously, maybe even a skin biopsy, but I do not have these results. 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 The etiology is unclear, but a spinal cord pathology is possible. I will work up as below.   PLAN: -Blood work: B1, copper , vit E, IFE -Get records: EMG/NMUS, or  skin biopsy from [facility] -MRI cervical and thoracic spine w/wo contrast -Will consider skin biopsy if other work up is negative and this has not been done previously For neuropathy: -Continue Cymbalta  60 mg daily -Increase: Gabapentin  400 mg TID -Lidocaine  cream PRN -Alpha lipoic acid 600 mg once or twice daily  Since their last visit: I got records from [facility] EMG and NMUS (right leg and bilateral arms). 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 I sent patient a message recommending wrist braces. MRI thoracic spine was unremarkable. 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 To be safe, I recommended patient see spine. ***  Patient had increased pain, so I increased gabapentin  to 400 mg TID on 05/26/23.   ***   MEDICATIONS:  Outpatient Encounter Medications as of 07/31/2023  Medication Sig   albuterol  (VENTOLIN  HFA) 108 (90 Base) MCG/ACT inhaler Inhale 2 puffs into the lungs every 6 (six) hours as needed for wheezing or shortness of breath.   benzonatate  (TESSALON  PERLES) 100 MG capsule Take 1 capsule (100 mg total) by mouth 3 (three) times daily as needed.   cyclobenzaprine  (FLEXERIL ) 10 MG tablet Take 1 tablet (10 mg total) by mouth at bedtime.   DULoxetine  (CYMBALTA ) 60 MG capsule Take 1 capsule (60 mg total) by mouth daily.   fluticasone  (FLONASE ) 50 MCG/ACT nasal spray Place 1 spray into both nostrils daily as needed for allergies or rhinitis.   gabapentin  (NEURONTIN ) 300 MG capsule Take 2 capsules (600 mg total) by mouth 3 (three) times daily.   hydrOXYzine  (VISTARIL ) 25 MG capsule TAKE 1 CAPSULE BY MOUTH TWICE DAILY AS NEEDED   metFORMIN  (GLUCOPHAGE ) 500 MG tablet Take 1 tablet by mouth once daily with breakfast   mupirocin  ointment (BACTROBAN ) 2 % Apply 1 application. topically 2 (two) times daily.   ondansetron  (ZOFRAN ) 4 MG tablet Take 1 tablet (4 mg total) by  mouth every 6 (six) hours.   spironolactone  (ALDACTONE ) 100 MG tablet Take 1 tablet (100 mg total) by mouth daily.   No facility-administered encounter medications on file as of 07/31/2023.    PAST MEDICAL HISTORY: Past Medical History:  Diagnosis Date   Abnormal uterine bleeding (AUB)    Anxiety    Pre-diabetes     PAST SURGICAL HISTORY: Past Surgical History:  Procedure Laterality Date   CESAREAN SECTION  2004   CHOLECYSTECTOMY  2004   LAPAROSCOPIC UNILATERAL SALPINGO OOPHERECTOMY Right 11/15/2019   Procedure: LAPAROSCOPIC RIGHT SALPINGO OOPHORECTOMY AND RIGHT OVARIAN CYSTECTOMY;  Surgeon: Tresia Fruit, MD;  Location: [facility];  Service: Gynecology;  Laterality: Right;    ALLERGIES: Allergies  Allergen Reactions   Dilaudid  [Hydromorphone ] Itching   Codeine Hives   Retin-A  [Tretinoin ] Other (See Comments)    Pt reports burning   Buspar  [Buspirone ]     Made her feel more depress   Tape Rash    FAMILY HISTORY: Family History  Problem Relation Age of Onset   Breast cancer Mother        5s    SOCIAL HISTORY: Social History  Tobacco Use   Smoking status: Every Day    Current packs/day: 0.50    Types: Cigarettes   Smokeless tobacco: Never   Tobacco comments:    2-3 cig a day  Vaping Use   Vaping status: Never Used  Substance Use Topics   Alcohol use: Not Currently   Drug use: Not Currently   Social History   Social History Narrative   Are you right handed or left handed? Left   Are you currently employed ?    What is your current occupation? none   Do you live at home alone?   Who lives with you? husband   What type of home do you live in: 1 story or 2 story? one   Caffiene 2-3 Red Bulls     Objective:  Vital Signs:  There were no vitals taken for this visit.  General:*** General appearance: Awake and alert. No distress. Cooperative with exam.  Skin: No obvious rash or jaundice. HEENT: Atraumatic. Anicteric. Lungs:  Non-labored breathing on room air  Heart: Regular Abdomen: Soft, non tender. Extremities: No edema. No obvious deformity.  Musculoskeletal: No obvious joint swelling.  Neurological: Mental Status: Alert. Speech fluent. No pseudobulbar affect Cranial Nerves: CNII: No RAPD. Visual fields intact. CNIII, IV, VI: PERRL. No nystagmus. EOMI. CN V: Facial sensation intact bilaterally to fine touch. [place] clench strong. Jaw jerk***. CN VII: Facial muscles symmetric and strong. No ptosis at rest or after sustained upgaze***. CN VIII: Hears finger rub well bilaterally. CN IX: No hypophonia. CN X: Palate elevates symmetrically. CN XI: Full strength shoulder shrug bilaterally. CN XII: Tongue protrusion full and midline. No atrophy or fasciculations. No significant dysarthria*** Motor: Tone is ***. *** fasciculations in *** extremities. *** atrophy. No grip or percussive myotonia.  Individual muscle group testing (MRC grade out of 5):  Movement     Neck flexion ***    Neck extension ***     Right Left   Shoulder abduction *** ***   Shoulder adduction *** ***   Shoulder ext rotation *** ***   Shoulder int rotation *** ***   Elbow flexion *** ***   Elbow extension *** ***   Wrist extension *** ***   Wrist flexion *** ***   Finger abduction - FDI *** ***   Finger abduction - ADM *** ***   Finger extension *** ***   Finger distal flexion - 2/3 *** ***   Finger distal flexion - 4/5 *** ***   Thumb flexion - FPL *** ***   Thumb abduction - APB *** ***    Hip flexion *** ***   Hip extension *** ***   Hip adduction *** ***   Hip abduction *** ***   Knee extension *** ***   Knee flexion *** ***   Dorsiflexion *** ***   Plantarflexion *** ***   Inversion *** ***   Eversion *** ***   Great toe extension *** ***   Great toe flexion *** ***     Reflexes:  Right Left  Bicep *** ***  Tricep *** ***  BrRad *** ***  Knee *** ***  Ankle *** ***   Pathological  Reflexes: Babinski: *** response bilaterally*** Hoffman: *** Troemner: *** Pectoral: *** Palmomental: *** Facial: *** Midline tap: *** Sensation: Pinprick: *** Vibration: *** Temperature: *** Proprioception: *** Coordination: Intact finger-to- nose-finger and heel-to-shin bilaterally. Romberg negative.*** Gait: Able to rise from chair with arms crossed unassisted. Normal, narrow-based gait. Able to tandem walk. Able to walk  on toes and heels.***   Lab and Test Review: New results: 01/30/23: B1 wnl Copper  wnl Vit E wnl IFE: no M protein  Lipid panel (03/30/23): tChol 285, LDL 191, TG 150  MRI cervical spine w/wo contrast (04/17/23): IMPRESSION *Multilevel degenerative change is notable for moderate central canal stenosis at C4-5 and C5-6. Also notable is multilevel neural foraminal narrowing which is mostly moderate but severe on the left at C4-5.  MRI thoracic spine w/wo contrast (2.28/25): IMPRESSION: *No significant central canal or neural foraminal narrowing.   EMG and NMUS [facility] - 12/18/2021):   Previously reviewed results: 12/23/22: B12: 650 BMP unremarkable   HbA1c (09/16/22): 5.5 [high of 6.2 in 2022]   TSH (03/12/22) wnl   External labs: 12/06/21: B12: 433 MMA wnl Folate wnl   Imaging: Left ankle xray (09/16/22): FINDINGS: No acute fracture or dislocation. Ankle mortise is preserved. Enthesophyte of the Achilles tendon. No area of erosion or osseous destruction. No unexpected radiopaque foreign body. Soft tissues are unremarkable.   IMPRESSION: Enthesophyte of the Achilles tendon.   Lumbar spine xray (08/07/20): FINDINGS: 11 mm anterolisthesis L5 on S1 with advanced degenerative change at L4-L5 and L5-S1. Chronic bilateral pars defect at L5. Vertebral body heights are normal. Remaining disc spaces are grossly patent. Facet degenerative change of the lower lumbar spine.   IMPRESSION: 1. No acute osseous abnormality. 2. Similar 11 mm  anterolisthesis L5 on S1 with bilateral pars defect at L5. Advanced degenerative changes at L4-L5 and L5-S1.  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

## 2023-07-28 ENCOUNTER — Ambulatory Visit: Payer: Medicaid Other | Admitting: Internal Medicine

## 2023-07-31 ENCOUNTER — Other Ambulatory Visit: Payer: Self-pay | Admitting: Internal Medicine

## 2023-07-31 ENCOUNTER — Encounter: Payer: Self-pay | Admitting: Neurology

## 2023-07-31 ENCOUNTER — Ambulatory Visit: Payer: Self-pay | Admitting: Neurology

## 2023-07-31 DIAGNOSIS — E66812 Obesity, class 2: Secondary | ICD-10-CM

## 2023-07-31 DIAGNOSIS — R7303 Prediabetes: Secondary | ICD-10-CM

## 2023-07-31 DIAGNOSIS — N938 Other specified abnormal uterine and vaginal bleeding: Secondary | ICD-10-CM

## 2023-08-08 ENCOUNTER — Other Ambulatory Visit: Payer: Self-pay | Admitting: Internal Medicine

## 2023-08-08 DIAGNOSIS — I10 Essential (primary) hypertension: Secondary | ICD-10-CM

## 2023-09-01 ENCOUNTER — Other Ambulatory Visit: Payer: Self-pay | Admitting: Internal Medicine

## 2023-09-01 DIAGNOSIS — N938 Other specified abnormal uterine and vaginal bleeding: Secondary | ICD-10-CM

## 2023-09-01 DIAGNOSIS — R7303 Prediabetes: Secondary | ICD-10-CM

## 2023-09-01 DIAGNOSIS — E6609 Other obesity due to excess calories: Secondary | ICD-10-CM

## 2023-09-08 ENCOUNTER — Telehealth: Payer: Self-pay | Admitting: Neurology

## 2023-09-08 NOTE — Telephone Encounter (Signed)
 Pt needs a new referral sent out for a spine doctor. Would like a call back

## 2023-09-09 NOTE — Telephone Encounter (Signed)
 Called and left message to call office back

## 2023-09-10 ENCOUNTER — Other Ambulatory Visit: Payer: Self-pay

## 2023-09-10 DIAGNOSIS — M5412 Radiculopathy, cervical region: Secondary | ICD-10-CM

## 2023-09-10 DIAGNOSIS — M4802 Spinal stenosis, cervical region: Secondary | ICD-10-CM

## 2023-09-10 NOTE — Telephone Encounter (Signed)
 Referral sent to Neurosurgery in [facility] to Dr. Claudene

## 2023-09-14 ENCOUNTER — Telehealth: Payer: Self-pay | Admitting: Internal Medicine

## 2023-09-14 NOTE — Telephone Encounter (Signed)
 Called pt to confirm appt for 7/29 LVM

## 2023-09-15 ENCOUNTER — Ambulatory Visit: Admitting: Internal Medicine

## 2023-10-13 NOTE — Progress Notes (Deleted)
 Referring Physician:  Vicci Barnie NOVAK, MD [address] [place],  [place] [address]  Primary Physician:  Vicci Barnie NOVAK, MD  History of Present Illness: 10/13/2023 [name] is here today with a chief complaint of ***  Neck pain? Arm pain?  Duration: *** Location: *** Quality: *** Severity: ***  Precipitating: aggravated by *** Modifying factors: made better by *** Weakness: none Timing: *** Bowel/Bladder Dysfunction: none  Conservative measures:  Physical therapy: *** has not participated in?? Multimodal medical therapy including regular antiinflammatories: *** gabapentin , lyrica, cymbalta , flexeril , meloxicam ,  Injections: *** no epidural steroid injections??  Past Surgery: ***no spinal surgeries   B[place] Josette Flinders has ***no symptoms of cervical myelopathy.  The symptoms are causing a significant impact on the patient's life.   Review of Systems:  A 10 point review of systems is negative, except for the pertinent positives and negatives detailed in the HPI.  Past Medical History: Past Medical History:  Diagnosis Date   Abnormal uterine bleeding (AUB)    Anxiety    Pre-diabetes     Past Surgical History: Past Surgical History:  Procedure Laterality Date   CESAREAN SECTION  2004   CHOLECYSTECTOMY  2004   LAPAROSCOPIC UNILATERAL SALPINGO OOPHERECTOMY Right 11/15/2019   Procedure: LAPAROSCOPIC RIGHT SALPINGO OOPHORECTOMY AND RIGHT OVARIAN CYSTECTOMY;  Surgeon: Eveline Lynwood MATSU, MD;  Location: [facility];  Service: Gynecology;  Laterality: Right;    Allergies: Allergies as of 10/20/2023 - Review Complete 07/01/2023  Allergen Reaction Noted   Dilaudid  [hydromorphone ] Itching 11/18/2019   Codeine Hives 07/02/2018   Retin-a  [tretinoin ] Other (See Comments) 04/23/2021   Buspar  [buspirone ]  09/16/2022   Tape Rash 11/18/2019    Medications: Outpatient Encounter Medications as of 10/20/2023  Medication Sig    albuterol  (VENTOLIN  HFA) 108 (90 Base) MCG/ACT inhaler Inhale 2 puffs into the lungs every 6 (six) hours as needed for wheezing or shortness of breath.   benzonatate  (TESSALON  PERLES) 100 MG capsule Take 1 capsule (100 mg total) by mouth 3 (three) times daily as needed.   cyclobenzaprine  (FLEXERIL ) 10 MG tablet Take 1 tablet (10 mg total) by mouth at bedtime.   DULoxetine  (CYMBALTA ) 60 MG capsule Take 1 capsule (60 mg total) by mouth daily.   fluticasone  (FLONASE ) 50 MCG/ACT nasal spray Place 1 spray into both nostrils daily as needed for allergies or rhinitis.   gabapentin  (NEURONTIN ) 300 MG capsule Take 2 capsules (600 mg total) by mouth 3 (three) times daily.   hydrOXYzine  (VISTARIL ) 25 MG capsule TAKE 1 CAPSULE BY MOUTH TWICE DAILY AS NEEDED   metFORMIN  (GLUCOPHAGE ) 500 MG tablet TAKE 1 TABLET BY MOUTH ONCE DAILY WITH BREAKFAST . APPOINTMENT REQUIRED FOR FUTURE REFILLS   mupirocin  ointment (BACTROBAN ) 2 % Apply 1 application. topically 2 (two) times daily.   ondansetron  (ZOFRAN ) 4 MG tablet Take 1 tablet (4 mg total) by mouth every 6 (six) hours.   spironolactone  (ALDACTONE ) 100 MG tablet Take 1 tablet by mouth once daily   No facility-administered encounter medications on file as of 10/20/2023.    Social History: Social History   Tobacco Use   Smoking status: Every Day    Current packs/day: 0.50    Types: Cigarettes   Smokeless tobacco: Never   Tobacco comments:    2-3 cig a day  Vaping Use   Vaping status: Never Used  Substance Use Topics   Alcohol use: Not Currently   Drug use: Not Currently    Family Medical  History: Family History  Problem Relation Age of Onset   Breast cancer Mother        75s    Physical Examination: @VITALWITHPAIN @  General: Patient is well developed, well nourished, calm, collected, and in no apparent distress. Attention to examination is appropriate.  Psychiatric: Patient is non-anxious.  Head:  Pupils equal, round, and reactive to  light.  ENT:  Oral mucosa appears well hydrated.  Neck:   Supple.  ***Full range of motion.  Respiratory: Patient is breathing without any difficulty.  Extremities: No edema.  Vascular: Palpable dorsal pedal pulses.  Skin:   On exposed skin, there are no abnormal skin lesions.  NEUROLOGICAL:     Awake, alert, oriented to person, place, and time.  Speech is clear and fluent. Fund of knowledge is appropriate.   Cranial Nerves: Pupils equal round and reactive to light.  Facial tone is symmetric.  Facial sensation is symmetric.  ROM of spine: ***full.  Palpation of spine: ***non tender.    Strength: Side Biceps Triceps Deltoid Interossei Grip Wrist Ext. Wrist Flex.  R 5 5 5 5 5 5 5   L 5 5 5 5 5 5 5    Side Iliopsoas Quads Hamstring PF DF EHL  R 5 5 5 5 5 5   L 5 5 5 5 5 5    Reflexes are ***2+ and symmetric at the biceps, triceps, brachioradialis, patella and achilles.   Hoffman's is absent.  Clonus is not present.  Toes are down-going.  Bilateral upper and lower extremity sensation is intact to light touch.    Gait is normal.   No difficulty with tandem gait.   No evidence of dysmetria noted.  Medical Decision Making  Imaging: ***  I have personally reviewed the images and agree with the above interpretation.  Assessment and Plan: Ms. Leflore is a pleasant 43 y.o. female with ***    Thank you for involving me in the care of this patient.   I spent a total of *** minutes in both face-to-face and non-face-to-face activities for this visit on the date of this encounter.   Lyle Decamp, PA-C Dept. of Neurosurgery

## 2023-10-14 ENCOUNTER — Other Ambulatory Visit: Payer: Self-pay | Admitting: Internal Medicine

## 2023-10-14 DIAGNOSIS — E66812 Other obesity due to excess calories: Secondary | ICD-10-CM

## 2023-10-14 DIAGNOSIS — N938 Other specified abnormal uterine and vaginal bleeding: Secondary | ICD-10-CM

## 2023-10-14 DIAGNOSIS — R7303 Prediabetes: Secondary | ICD-10-CM

## 2023-10-15 ENCOUNTER — Other Ambulatory Visit: Payer: Self-pay | Admitting: Internal Medicine

## 2023-10-15 ENCOUNTER — Ambulatory Visit: Payer: Self-pay

## 2023-10-15 DIAGNOSIS — E6609 Other obesity due to excess calories: Secondary | ICD-10-CM

## 2023-10-15 DIAGNOSIS — G8929 Other chronic pain: Secondary | ICD-10-CM

## 2023-10-15 DIAGNOSIS — N938 Other specified abnormal uterine and vaginal bleeding: Secondary | ICD-10-CM

## 2023-10-15 DIAGNOSIS — R7303 Prediabetes: Secondary | ICD-10-CM

## 2023-10-15 NOTE — Telephone Encounter (Unsigned)
 Copied from CRM [id number]. Topic: Clinical - Medication Refill >> Oct 15, 2023 10:16 AM Jayma L wrote: Medication:  metFORMIN  (GLUCOPHAGE ) 500 MG tablet cyclobenzaprine  (FLEXERIL ) 10 MG tablet   Has the patient contacted their pharmacy? Yes (Agent: If no, request that the patient contact the pharmacy for the refill. If patient does not wish to contact the pharmacy document the reason why and proceed with request.) (Agent: If yes, when and what did the pharmacy advise?)  This is the patient's preferred pharmacy:   [id number] - [place] - [address]  ([place]) [place] [address] Phone: [phone] Fax: [phone]   Is this the correct pharmacy for this prescription? Yes If no, delete pharmacy and type the correct one.   Has the prescription been filled recently? Yes  Is the patient out of the medication? No  Has the patient been seen for an appointment in the last year OR does the patient have an upcoming appointment? Yes  Can we respond through MyChart? Yes  Agent: Please be advised that Rx refills may take up to 3 business days. We ask that you follow-up with your pharmacy.

## 2023-10-15 NOTE — Telephone Encounter (Signed)
 FYI Only or Action Required?: Action required by provider: request for appointment.  Patient was last seen in primary care on 03/30/2023 by Vicci Barnie NOVAK, MD.  Called Nurse Triage reporting Urinary Tract Infection.  Symptoms began a week ago.  Interventions attempted: Rest, hydration, or home remedies.  Symptoms are: unchanged.  Triage Disposition: See HCP Within 4 Hours (Or PCP Triage)  Patient/caregiver understands and will follow disposition?: Yes, will follow disposition  Copied from CRM #[id number]. Topic: Clinical - Red Word Triage >> Oct 15, 2023 10:23 AM Jayma L wrote: Red Word that prompted transfer to Nurse Triage: patient stated she's been feeling cold all the time and thinks she has a infection in her body. Said she has been having burning when urination and some lower back pain as well. Fatigue from the gabapentin  (NEURONTIN ) 300 MG capsule. And urinating often Reason for Disposition  Side (flank) or lower back pain present  Answer Assessment - Initial Assessment Questions 1. SYMPTOM: What's the main symptom you're concerned about? (e.g., frequency, incontinence)     Pressure with urination, low back pain 2. ONSET: When did the  pressure and low back pain  start?     3 weeks ago 3. PAIN: Is there any pain? If Yes, ask: How bad is it? (Scale: 1-10; mild, moderate, severe)     5 4. CAUSE: What do you think is causing the symptoms?     Unsure, feels like infection in body 5. OTHER SYMPTOMS: Do you have any other symptoms? (e.g., blood in urine, fever, flank pain, pain with urination)     States that she has been feeling cold on inside, shaking about a week ago 6. PREGNANCY: Is there any chance you are pregnant? When was your last menstrual period?     Denies  Offered appt today, pt refused, states that she does not have transportation today. Pt advised that UC could be utilized. Pt will attempt to use UC tonight when she has transportation. Pt states  that she needs her metformin  refilled. MAR states that she needs an appt before the medication will be refilled. No appts with PCP until 10/13, routing to clinic requesting med refill appt.  Protocols used: Urinary Symptoms-A-AH

## 2023-10-16 ENCOUNTER — Ambulatory Visit: Attending: Internal Medicine | Admitting: Internal Medicine

## 2023-10-16 ENCOUNTER — Encounter: Payer: Self-pay | Admitting: Internal Medicine

## 2023-10-16 VITALS — BP 119/79 | HR 84 | Temp 98.1°F | Ht 68.0 in | Wt 200.0 lb

## 2023-10-16 DIAGNOSIS — M5441 Lumbago with sciatica, right side: Secondary | ICD-10-CM

## 2023-10-16 DIAGNOSIS — R233 Spontaneous ecchymoses: Secondary | ICD-10-CM

## 2023-10-16 DIAGNOSIS — G8929 Other chronic pain: Secondary | ICD-10-CM

## 2023-10-16 DIAGNOSIS — R7303 Prediabetes: Secondary | ICD-10-CM | POA: Diagnosis not present

## 2023-10-16 DIAGNOSIS — E66811 Obesity, class 1: Secondary | ICD-10-CM

## 2023-10-16 DIAGNOSIS — M5442 Lumbago with sciatica, left side: Secondary | ICD-10-CM | POA: Diagnosis not present

## 2023-10-16 DIAGNOSIS — Z7984 Long term (current) use of oral hypoglycemic drugs: Secondary | ICD-10-CM

## 2023-10-16 DIAGNOSIS — I10 Essential (primary) hypertension: Secondary | ICD-10-CM

## 2023-10-16 DIAGNOSIS — Z6830 Body mass index (BMI) 30.0-30.9, adult: Secondary | ICD-10-CM

## 2023-10-16 DIAGNOSIS — Z23 Encounter for immunization: Secondary | ICD-10-CM

## 2023-10-16 DIAGNOSIS — E6609 Other obesity due to excess calories: Secondary | ICD-10-CM

## 2023-10-16 DIAGNOSIS — F17200 Nicotine dependence, unspecified, uncomplicated: Secondary | ICD-10-CM

## 2023-10-16 LAB — POCT GLYCOSYLATED HEMOGLOBIN (HGB A1C): HbA1c, POC (prediabetic range): 5.5 % — AB (ref 5.7–6.4)

## 2023-10-16 LAB — GLUCOSE, POCT (MANUAL RESULT ENTRY): POC Glucose: 186 mg/dL — AB (ref 70–99)

## 2023-10-16 MED ORDER — METFORMIN HCL 500 MG PO TABS: 500.0000 mg | ORAL_TABLET | Freq: Every day | ORAL | 1 refills | Status: DC

## 2023-10-16 MED ORDER — CYCLOBENZAPRINE HCL 10 MG PO TABS: 10.0000 mg | ORAL_TABLET | Freq: Every day | ORAL | 1 refills | Status: AC

## 2023-10-16 MED ORDER — SPIRONOLACTONE 100 MG PO TABS: 100.0000 mg | ORAL_TABLET | Freq: Every day | ORAL | 1 refills | Status: DC

## 2023-10-16 NOTE — Progress Notes (Signed)
 Patient ID: Heidi Bartlett, female    DOB: 07/29/1980  MRN: [id number]  CC: Hypertension (HTN & pre-diabetes f/u. Thompson GLP-1. Carmine bruising on legs & staying for weeks, feels constant cold Janiece to flu vax)   Subjective: Heidi Bartlett is a 43 y.o. female who presents for chronic ds management. Her concerns today include:  History of HTN, obesity/preDM, hep C treated,  tob dep, chronic right-sided lower back pain, DUB/PCOS, preDM, GAD/MDD, small fiber neuropathy ([facility] neurology fall 2023).   Discussed the use of AI scribe software for clinical note transcription with the patient, who gave verbal consent to proceed.  History of Present Illness [name] is a 43 year old female with hypertension and prediabetes who presents for follow-up and discussion of weight management options.  She is interested in exploring weight management options, specifically considering GLP-1 agents like Wegovy or Zepbound. Her current weight is 200 pounds, a reduction from 209 pounds in February and 213 pounds from a year ago. She attributes her weight loss to eating smaller portions and walking daily, although she feels the progress is slower than desired. She has reduced her intake of sugary drinks and primarily drinks water, having stopped drinking soda.  Her most recent A1c is 5.5 and she continues to take metformin  500 mg once daily. She continues to take metformin  500 mg once daily.  HTN: Her most recent blood pressure was 119/79 mmHg. She is on spironolactone  100 mg daily and reports limiting salt intake in her diet.  She experiences unexplained bruising on her legs, which appear without significant trauma. Some bruises appear spontaneously, while others may be related to minor bumps.  She had reported same thing to me on this year CBC at that time was normal and PTT/PT also normal.    She continues to smoke cigarettes and is not ready to quit at this time.   Requests refill on  Flexeril  for her back.    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 6.7 g 0   benzonatate  (TESSALON  PERLES) 100 MG capsule Take 1 capsule (100 mg total) by mouth 3 (three) times daily as needed. 20 capsule 0   cyclobenzaprine  (FLEXERIL ) 10 MG tablet Take 1 tablet (10 mg total) by mouth at bedtime. 90 tablet 0   DULoxetine  (CYMBALTA ) 60 MG capsule Take 1 capsule (60 mg total) by mouth daily. 90 capsule 1   fluticasone  (FLONASE ) 50 MCG/ACT nasal spray Place 1 spray into both nostrils daily as needed for allergies or rhinitis. 16 g 1   gabapentin  (NEURONTIN ) 300 MG capsule Take 2 capsules (600 mg total) by mouth 3 (three) times daily. 180 capsule 5   hydrOXYzine  (VISTARIL ) 25 MG capsule TAKE 1 CAPSULE BY MOUTH  TWICE DAILY AS NEEDED 180 capsule 0   metFORMIN  (GLUCOPHAGE ) 500 MG tablet TAKE 1 TABLET BY MOUTH ONCE DAILY WITH BREAKFAST . APPOINTMENT REQUIRED FOR FUTURE REFILLS 30 tablet 0   mupirocin  ointment (BACTROBAN ) 2 % Apply 1 application. topically 2 (two) times daily. 22 g 0   ondansetron  (ZOFRAN ) 4 MG tablet Take 1 tablet (4 mg total) by mouth every 6 (six) hours. 12 tablet 0   spironolactone   (ALDACTONE ) 100 MG tablet Take 1 tablet by mouth once daily 90 tablet 0   No current facility-administered medications on file prior to visit.    Allergies  Allergen Reactions   Dilaudid  [Hydromorphone ] Itching   Codeine Hives   Retin-A  [Tretinoin ] Other (See Comments)    Pt reports burning   Buspar  [Buspirone ]     Made her feel more depress   Tape Rash    Social History   Socioeconomic History   Marital status: Married    Spouse name: Not on file   Number of children: 0   Years of education: 9th grade   Highest education level: 10th grade  Occupational History   Occupation: unemployed  Tobacco Use   Smoking status: Every Day    Current packs/day: 0.50    Types: Cigarettes   Smokeless tobacco: Never   Tobacco comments:    2-3 cig a day  Vaping Use   Vaping status: Never Used  Substance and Sexual Activity   Alcohol use: Not Currently   Drug use: Not Currently   Sexual activity: Yes    Birth control/protection: None  Other Topics Concern   Not on file  Social History Narrative   Are you right handed or left handed? Left   Are you currently employed ?    What is your current occupation? none   Do you live at home alone?   Who lives with you? husband   What type of home do you live in: 1 story or 2 story? one   Caffiene 2-3 Red Bulls    Social Drivers of Health   Financial Resource Strain: Low Risk  (10/15/2023)   Overall Financial Resource Strain (CARDIA)    Difficulty of Paying Living Expenses: Not hard at all  Food Insecurity: No Food Insecurity (10/15/2023)   Hunger Vital Sign    Worried About Running Out of Food in the Last Year: Never true    Ran Out of Food in the Last Year: Never true  Transportation Needs: Unmet Transportation Needs (10/15/2023)   PRAPARE - Transportation    Lack of Transportation (Medical): Yes    Lack of Transportation (Non-Medical): Yes  Physical Activity: Insufficiently Active (10/15/2023)   Exercise Vital Sign    Days of  Exercise per Week: 3 days    Minutes of Exercise per Session: 20 min  Stress: No Stress Concern Present (10/15/2023)   [employer] of Occupational Health - Occupational Stress Questionnaire    Feeling of Stress: Only a little  Social Connections: Socially Isolated (10/15/2023)   Social Connection and Isolation Panel    Frequency of Communication with Friends and Family: Twice a week    Frequency of Social Gatherings with Friends and Family: Never    Attends Religious Services: Never    Database administrator or Organizations: No    Attends Banker Meetings: Not on file    Marital Status: Married  Intimate Partner Violence: Not At Risk (12/23/2022)   Humiliation, Afraid, Rape, and Kick questionnaire    Fear of  Current or Ex-Partner: No    Emotionally Abused: No    Physically Abused: No    Sexually Abused: No    Family History  Problem Relation Age of Onset   Breast cancer Mother        57s    Past Surgical History:  Procedure Laterality Date   CESAREAN SECTION  2004   CHOLECYSTECTOMY  2004   LAPAROSCOPIC UNILATERAL SALPINGO OOPHERECTOMY Right 11/15/2019   Procedure: LAPAROSCOPIC RIGHT SALPINGO OOPHORECTOMY AND RIGHT OVARIAN CYSTECTOMY;  Surgeon: Eveline Lynwood MATSU, MD;  Location:  S[facility];  Service: Gynecology;  Laterality: Right;    ROS: Review of Systems Negative except as stated above  PHYSICAL EXAM: BP 119/79 (BP Location: Left Arm, Patient Position: Sitting, Cuff Size: Normal)   Pulse 84   Temp 98.1 F (36.7 C) (Oral)   Ht 5' 8 (1.727 m)   Wt 200 lb (90.7 kg)   SpO2 97%   BMI 30.41 kg/m   Wt Readings from Last 3 Encounters:  10/16/23 200 lb (90.7 kg)  07/01/23 209 lb (94.8 kg)  03/30/23 209 lb (94.8 kg)    Physical Exam  General appearance - alert, well appearing, and in no distress Mental status - normal mood, behavior, speech, dress, motor activity, and thought processes Chest - clear to auscultation, no wheezes, rales or  rhonchi, symmetric air entry Heart - normal rate, regular rhythm, normal S1, S2, no murmurs, rubs, clicks or gallops Extremities - peripheral pulses normal, no pedal edema, no clubbing or cyanosis Skin -no skin bruising noted on the legs.  She has a resolving area of small ecchymosis just below the right knee.      Latest Ref Rng & Units 03/30/2023   10:26 AM 12/23/2022    4:33 PM 03/12/2022   10:17 AM  CMP  Glucose 70 - 99 mg/dL  76  95   BUN 6 - 24 mg/dL  7  11   Creatinine 9.42 - 1.00 mg/dL  9.24  9.28   Sodium 865 - 144 mmol/L  143  145   Potassium 3.5 - 5.2 mmol/L  4.9  4.3   Chloride 96 - 106 mmol/L  105  106   CO2 20 - 29 mmol/L  28  22   Calcium  8.7 - 10.2 mg/dL  9.4  9.1   Total Protein 6.0 - 8.5 g/dL 7.8     Total Bilirubin 0.0 - 1.2 mg/dL 0.3     Alkaline Phos 44 - 121 IU/L 75     AST 0 - 40 IU/L 17     ALT 0 - 32 IU/L 14      Lipid Panel     Component Value Date/Time   CHOL 285 (H) 03/30/2023 1026   TRIG 150 (H) 03/30/2023 1026   HDL 67 03/30/2023 1026   CHOLHDL 4.3 03/30/2023 1026   LDLCALC 191 (H) 03/30/2023 1026    CBC    Component Value Date/Time   WBC 9.9 03/30/2023 1026   WBC 8.5 12/02/2021 0922   RBC 5.23 03/30/2023 1026   RBC 4.61 12/02/2021 0922   HGB 15.1 03/30/2023 1026   HCT 46.8 (H) 03/30/2023 1026   PLT 274 03/30/2023 1026   MCV 90 03/30/2023 1026   MCH 28.9 03/30/2023 1026   MCH 30.2 12/02/2021 0922   MCHC 32.3 03/30/2023 1026   MCHC 34.1 12/02/2021 0922   RDW 13.5 03/30/2023 1026   LYMPHSABS 2.5 03/12/2022 1017   MONOABS 0.5 01/10/2017 0929  EOSABS 0.3 03/12/2022 1017   BASOSABS 0.1 03/12/2022 1017    ASSESSMENT AND PLAN: 1. Essential hypertension (Primary) At goal.  Continue spironolactone . - spironolactone  (ALDACTONE ) 100 MG tablet; Take 1 tablet (100 mg total) by mouth daily.  Dispense: 90 tablet; Refill: 1  2. Class 1 obesity due to excess calories with serious comorbidity and body mass index (BMI) of 30.0 to 30.9 in  adult Commended her on weight loss so far even though it has been slow.  Looks like her insurance does not cover [employer] or Zepbound.  Encouraged her to continue healthy eating habits and regular exercise. - metFORMIN  (GLUCOPHAGE ) 500 MG tablet; Take 1 tablet (500 mg total) by mouth daily with breakfast.  Dispense: 90 tablet; Refill: 1  3. Prediabetes A1c now in normal range.  Continue metformin  and healthy eating habits.  Continue regular exercise. - POCT glycosylated hemoglobin (Hb A1C) - POCT glucose (manual entry) - metFORMIN  (GLUCOPHAGE ) 500 MG tablet; Take 1 tablet (500 mg total) by mouth daily with breakfast.  Dispense: 90 tablet; Refill: 1  4. Chronic bilateral low back pain with bilateral sciatica - cyclobenzaprine  (FLEXERIL ) 10 MG tablet; Take 1 tablet (10 mg total) by mouth at bedtime.  Dispense: 90 tablet; Refill: 1  5. Easy bruising Advised that we can refer to hematology for further evaluation/workup but patient declined. - CBC  6. Tobacco dependence Advised to quit.  Patient not ready to give a trial of quitting.  7. Need for influenza vaccination - Flu vaccine trivalent PF, 6mos and older(Flulaval,Afluria,Fluarix,Fluzone)  8. Need for vaccination against Streptococcus pneumoniae - Pneumococcal conjugate vaccine 20-valent   Patient was given the opportunity to ask questions.  Patient verbalized understanding of the plan and was able to repeat key elements of the plan.   This documentation was completed using Paediatric nurse.  Any transcriptional errors are unintentional.  Orders Placed This Encounter  Procedures   POCT glycosylated hemoglobin (Hb A1C)   POCT glucose (manual entry)     Requested Prescriptions   Pending Prescriptions Disp Refills   cyclobenzaprine  (FLEXERIL ) 10 MG tablet 90 tablet 1    Sig: Take 1 tablet (10 mg total) by mouth at bedtime.   metFORMIN  (GLUCOPHAGE ) 500 MG tablet 90 tablet 1    Sig: 0.5 tablets (250 mg total).    spironolactone  (ALDACTONE ) 100 MG tablet 90 tablet 1    Sig: Take 1 tablet (100 mg total) by mouth daily.    No follow-ups on file.  Barnie Louder, MD, FACP

## 2023-10-16 NOTE — Patient Instructions (Signed)
 VISIT SUMMARY:  Today, we discussed your progress with weight management, blood pressure, and prediabetes. We also addressed your concerns about unexplained bruising and reviewed your smoking habits. Vaccinations for flu and pneumonia were administered.  YOUR PLAN:  -OBESITY, CLASS 2: Obesity is a condition where excess body fat has accumulated to the extent that it may have a negative effect on health. You have successfully reduced your weight from 213 lbs to 200 lbs over the past year. Continue with your current dietary habits of eating smaller portions and regular exercise, including daily walking. We discussed the limitations of insurance coverage for GLP-1 agonists like Wegovy or Zepbound, which require a diabetes diagnosis for coverage.  -ESSENTIAL HYPERTENSION: Hypertension is high blood pressure. Your blood pressure is well controlled at 119/79 mmHg with spironolactone  and dietary salt restriction. Continue taking spironolactone  100 mg daily and maintain your dietary salt restriction.  -HISTORY OF PREDIABETES: Prediabetes is a condition where blood sugar levels are higher than normal but not high enough to be classified as diabetes. Your A1c has normalized to 5.5% with metformin . Continue taking metformin  500 mg daily and reduce your intake of sugary drinks, including energy drinks.  -EASY BRUISING OF LOWER EXTREMITIES: You have been experiencing unexplained bruising on your legs, which may be related to your medication, Cymbalta . Your blood work showed normal platelet counts and no issues with blood clotting. You declined a referral to a hematologist at this time.  -TOBACCO USE: You continue to smoke cigarettes and are not ready to quit at this time. We encourage you to consider smoking cessation when you feel ready.  -GENERAL HEALTH MAINTENANCE: You were due for flu and pneumonia vaccines, which were administered today.  INSTRUCTIONS:  Please continue with your current medications and  lifestyle changes as discussed. Follow up with us  if you experience any new symptoms or have any concerns. Consider smoking cessation when you feel ready. We will monitor your progress and adjust your treatment plan as needed.

## 2023-10-17 ENCOUNTER — Ambulatory Visit: Payer: Self-pay | Admitting: Internal Medicine

## 2023-10-17 LAB — CBC
Hematocrit: 44.9 % (ref 34.0–46.6)
Hemoglobin: 14.7 g/dL (ref 11.1–15.9)
MCH: 29.5 pg (ref 26.6–33.0)
MCHC: 32.7 g/dL (ref 31.5–35.7)
MCV: 90 fL (ref 79–97)
Platelets: 216 x10E3/uL (ref 150–450)
RBC: 4.99 x10E6/uL (ref 3.77–5.28)
RDW: 13 % (ref 11.7–15.4)
WBC: 10.8 x10E3/uL (ref 3.4–10.8)

## 2023-10-20 ENCOUNTER — Ambulatory Visit: Admitting: Physician Assistant

## 2023-10-31 ENCOUNTER — Other Ambulatory Visit: Payer: Self-pay | Admitting: Family Medicine

## 2023-10-31 DIAGNOSIS — G629 Polyneuropathy, unspecified: Secondary | ICD-10-CM

## 2023-11-06 NOTE — Progress Notes (Deleted)
 Referring Physician:  Vicci Barnie NOVAK, MD [address] [place] [place],  [place] [address]  Primary Physician:  Vicci Barnie NOVAK, MD  History of Present Illness: 11/06/2023*** [name] has a history of HTN, prediabetes, obesity, mixed hyperlipidemia, former drug abuse.   She has history of bilateral CTS per [facility] neurology notes 04/19/22. Has seen Dr. Leigh who recommended we see her for her moderate cervical stenosis and hyper reflexia.***  Duration: *** Location: *** Quality: *** Severity: ***  Precipitating: aggravated by *** Modifying factors: made better by *** Weakness: none Timing: ***  Tobacco use: smokes 1/2 PPD x *** years.   Bowel/Bladder Dysfunction: none  Conservative measures:  Physical therapy: ***  Multimodal medical therapy including regular antiinflammatories: flexeril , cymbalta , neurontin , mobic , lyrica  Injections: *** epidural steroid injections  Past Surgery: ***  Heidi Bartlett has ***no symptoms of cervical myelopathy.  The symptoms are causing a significant impact on the patient's life.   Review of Systems:  A 10 point review of systems is negative, except for the pertinent positives and negatives detailed in the HPI.  Past Medical History: Past Medical History:  Diagnosis Date   Abnormal uterine bleeding (AUB)    Anxiety    Pre-diabetes     Past Surgical History: Past Surgical History:  Procedure Laterality Date   CESAREAN SECTION  2004   CHOLECYSTECTOMY  2004   LAPAROSCOPIC UNILATERAL SALPINGO OOPHERECTOMY Right 11/15/2019   Procedure: LAPAROSCOPIC RIGHT SALPINGO OOPHORECTOMY AND RIGHT OVARIAN CYSTECTOMY;  Surgeon: Eveline Lynwood MATSU, MD;  Location: [facility];  Service: Gynecology;  Laterality: Right;    Allergies: Allergies as of 11/10/2023 - Review Complete 10/16/2023  Allergen Reaction Noted   Dilaudid  [hydromorphone ] Itching 11/18/2019   Codeine Hives 07/02/2018   Retin-a  [tretinoin ] Other  (See Comments) 04/23/2021   Buspar  [buspirone ]  09/16/2022   Tape Rash 11/18/2019    Medications: Outpatient Encounter Medications as of 11/10/2023  Medication Sig   albuterol  (VENTOLIN  HFA) 108 (90 Base) MCG/ACT inhaler Inhale 2 puffs into the lungs every 6 (six) hours as needed for wheezing or shortness of breath.   cyclobenzaprine  (FLEXERIL ) 10 MG tablet Take 1 tablet (10 mg total) by mouth at bedtime.   DULoxetine  (CYMBALTA ) 60 MG capsule Take 1 capsule by mouth once daily   fluticasone  (FLONASE ) 50 MCG/ACT nasal spray Place 1 spray into both nostrils daily as needed for allergies or rhinitis.   folic acid (FOLVITE) 400 MCG tablet Take 400 mcg by mouth.   gabapentin  (NEURONTIN ) 300 MG capsule Take 2 capsules (600 mg total) by mouth 3 (three) times daily.   hydrOXYzine  (VISTARIL ) 25 MG capsule TAKE 1 CAPSULE BY MOUTH TWICE DAILY AS NEEDED   meloxicam  (MOBIC ) 15 MG tablet Take 15 mg by mouth.   metFORMIN  (GLUCOPHAGE ) 500 MG tablet Take 1 tablet (500 mg total) by mouth daily with breakfast.   mupirocin  ointment (BACTROBAN ) 2 % Apply 1 application. topically 2 (two) times daily.   ondansetron  (ZOFRAN ) 4 MG tablet Take 1 tablet (4 mg total) by mouth every 6 (six) hours.   pregabalin (LYRICA) 50 MG capsule Take 50 mg by mouth.   spironolactone  (ALDACTONE ) 100 MG tablet Take 1 tablet (100 mg total) by mouth daily.   No facility-administered encounter medications on file as of 11/10/2023.    Social History: Social History   Tobacco Use   Smoking status: Every Day    Current packs/day: 0.50    Types: Cigarettes   Smokeless tobacco:  Never   Tobacco comments:    2-3 cig a day  Vaping Use   Vaping status: Never Used  Substance Use Topics   Alcohol use: Not Currently   Drug use: Not Currently    Family Medical History: Family History  Problem Relation Age of Onset   Breast cancer Mother        30s    Physical Examination: There were no vitals filed for this  visit.  General: Patient is well developed, well nourished, calm, collected, and in no apparent distress. Attention to examination is appropriate.  Respiratory: Patient is breathing without any difficulty.   NEUROLOGICAL:     Awake, alert, oriented to person, place, and time.  Speech is clear and fluent. Fund of knowledge is appropriate.   Cranial Nerves: Pupils equal round and reactive to light.  Facial tone is symmetric.    *** ROM of cervical spine *** pain *** posterior cervical tenderness. *** tenderness in bilateral trapezial region.   *** ROM of lumbar spine *** pain *** posterior lumbar tenderness.   No abnormal lesions on exposed skin.   Strength: Side Biceps Triceps Deltoid Interossei Grip Wrist Ext. Wrist Flex.  R 5 5 5 5 5 5 5   L 5 5 5 5 5 5 5    Side Iliopsoas Quads Hamstring PF DF EHL  R 5 5 5 5 5 5   L 5 5 5 5 5 5    Reflexes are ***2+ and symmetric at the biceps, brachioradialis, patella and achilles.   Hoffman's is absent.  Clonus is not present.   Bilateral upper and lower extremity sensation is intact to light touch.     Gait is normal.   ***No difficulty with tandem gait.    Medical Decision Making  Imaging: Cervical MRI dated 04/17/23:  FINDINGS: Straightening of the normal cervical lordosis could relate to muscle spasm or could be positional.   No prevertebral soft tissue swelling.   No significant listhesis.   No significant vertebral body height loss.   Multilevel disc space narrowing is moderate at C4-5 and C5-6.   Cervical spinal cord is normal in signal intensity.   Heterogeneous bone marrow signal with multilevel bone marrow endplate edematous change.   No suspicious intracanalicular enhancement.   Imaging to the visualized portions of the brain parenchyma reveals no acute finding.   C2-3: No significant central canal narrowing. Moderate left neural foraminal narrowing mostly from disc osteophyte. No significant right neural  foraminal narrowing.   C3-4: Mild central canal stenosis from disc osteophyte complex. Moderate bilateral neural foraminal narrowing from disc osteophyte and uncovertebral hypertrophy.   C4-5: Moderate central canal stenosis from disc osteophyte complex. Moderate right and severe left neural foraminal narrowing mostly from disc osteophyte.   C5-6: Moderate narrowing of the left aspect of the central canal from disc osteophyte complex with superimposed left paracentral disc protrusion. Moderate left without significant right neural foraminal narrowing mostly from disc osteophyte.   C6-7: Mild central canal stenosis from disc osteophyte complex. Mild bilateral neural foramen narrowing is more pronounced on the right related to disc osteophyte.   C7-T1: No significant central canal narrowing. No significant neural foraminal narrowing.   IMPRESSION: IMPRESSION *Multilevel degenerative change is notable for moderate central canal stenosis at C4-5 and C5-6. Also notable is multilevel neural foraminal narrowing which is mostly moderate but severe on the left at C4-5. *Please see above for more details.     Electronically Signed   By: Bryon  Nastasi M.D.  On: 05/09/2023 10:22    I have personally reviewed the images and agree with the above interpretation.  Assessment and Plan: Ms. Lalli is a pleasant 43 y.o. female has ***  Treatment options discussed with patient and following plan made:   - Order for physical therapy for *** spine ***. Patient to call to schedule appointment. *** - Continue current medications including ***. Reviewed dosing and side effects.  - Prescription for ***. Reviewed dosing and side effects. Take with food.  - Prescription for *** to take prn muscle spasms. Reviewed dosing and side effects. Discussed this can cause drowsiness.  - MRI of *** to further evaluate *** radiculopathy. No improvement time or medications (***).  - Referral to PMR at [facility] to  discuss possible *** injections.  - Will schedule phone visit to review MRI results once I get them back.   I spent a total of *** minutes in face-to-face and non-face-to-face activities related to this patient's care today including review of outside records, review of imaging, review of symptoms, physical exam, discussion of differential diagnosis, discussion of treatment options, and documentation.   Thank you for involving me in the care of this patient.   Glade Boys PA-C Dept. of Neurosurgery

## 2023-11-10 ENCOUNTER — Ambulatory Visit: Admitting: Orthopedic Surgery

## 2023-11-17 ENCOUNTER — Other Ambulatory Visit: Payer: Self-pay | Admitting: Neurology

## 2023-11-17 DIAGNOSIS — G629 Polyneuropathy, unspecified: Secondary | ICD-10-CM

## 2023-11-26 ENCOUNTER — Encounter: Payer: Self-pay | Admitting: Podiatry

## 2023-11-26 ENCOUNTER — Ambulatory Visit (INDEPENDENT_AMBULATORY_CARE_PROVIDER_SITE_OTHER): Admitting: Podiatry

## 2023-11-26 ENCOUNTER — Ambulatory Visit

## 2023-11-26 DIAGNOSIS — M7662 Achilles tendinitis, left leg: Secondary | ICD-10-CM

## 2023-11-26 DIAGNOSIS — M79672 Pain in left foot: Secondary | ICD-10-CM

## 2023-11-26 MED ORDER — DICLOFENAC SODIUM 75 MG PO TBEC: 75.0000 mg | DELAYED_RELEASE_TABLET | Freq: Two times a day (BID) | ORAL | 2 refills | Status: AC

## 2023-11-26 NOTE — Progress Notes (Signed)
 Subjective:   Patient ID: Heidi Bartlett, female   DOB: 43 y.o.   MRN: [id number]   HPI Presents stating that she traumatized her left posterior heel it is been sore and had been doing well prior to this with her Achilles tendinitis   ROS      Objective:  Physical Exam  Neurovascular status intact inflammation of the posterior heel region left mild swelling mild fluid buildup     Assessment:  Acute Achilles tendinitis left secondary to trauma     Plan:  H&P reviewed and I have recommended ice therapy immobilization and she will get a boot on the Internet air fracture walker and I placed on Mobic  with all instructions on medication and usage.  Patient will be seen back to recheck     Patient

## 2023-12-10 NOTE — Progress Notes (Deleted)
 I saw Heidi Bartlett in neurology clinic on 12/18/23 in follow up for burning pain in feet.  HPI: Heidi Bartlett is a 43 y.o. year old female with a history of pre-DM, HTN, HLD, anxiety, former drug abuse, and current smoker who we last saw on 01/30/23.  To briefly review: 01/30/23: Patient's symptoms started around 2017. She first noticed numbness and tingling in legs and hands. She also had cramping in her hands. The numbness and tingling was from calves down and hands bilaterally. The feet are worse than hands. The symptoms have been slowly progressive. The feet then started burning in the last couple of years. It is worse when she is laying in bed. She mentions her legs are restless in bed. When she gets up and walks, sometimes this makes her symptoms better. She finds it hard to keep shoes or socks on as a result of symptoms. She denies significant color change, warmth, or swelling of feet during symptoms.   She endorses imbalance. She will occasionally hit walls while walking and has rare falls. She endorses chronic low back pain and right sided sciatica.   Patient had an EMG and NMUS at [facility] and had EMG and NMUS on 01/01/22. I do not have these results, and patient does not well remember the results, but mentions being told something about small and large fibers, but again, does not remember the conversation. She did not have a skin biopsy.    For her symptoms, she was first put on gabapentin  by PCP (600 mg TID). It worked at this dose but made her too sleepy. The dose was lowered but then it wouldn't work. At one point, [facility] had stopped gabapentin  and started Lyrica (up to 50 mg) but this did not help (was on it 2 months). She stopped Lyrica and went back on gabapentin . She is currently on gabapentin  300 mg TID but does not really work. She is also currently on Cymbalta  60 mg daily (increased since 12/23/22). This seems to be helping a little when taken  with the gabapentin . She has not tried anything else including creams.   The patient does not report symptoms referable to autonomic dysfunction including impaired sweating, heat or cold intolerance, excessive mucosal dryness, gastroparetic early satiety, postprandial abdominal bloating, constipation, bowel or bladder dyscontrol, or syncope/presyncope/orthostatic intolerance.   She does not report any constitutional symptoms like fever, night sweats, anorexia or unintentional weight loss.   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 Normal Family history of neuropathy/myopathy/neurology disease? Mother, grandmother, great grandmother, and niece with similar symptoms (neuropathy)  Most recent Assessment and Plan (01/30/23): Heidi Bartlett is a 43 y.o. female who presents for evaluation of burning of feet and numbness and tingling in feet and hands. She has a relevant medical history of pre-DM, HTN, HLD, anxiety, former drug abuse, and current smoker. Her neurological examination is pertinent for diminished sensation to pinprick in right foot and hyperreflexia in lower extremities (right >> left). Available diagnostic data is significant for HbA1c 5.5, normal folate, B12, and MMA. Lumbar spine xray in 2022 showed advanced degenerative changes at L4-5 and L5-S1. She also had at least an EMG and NMUS at [facility] previously, maybe even a skin biopsy, but I do not have these results. 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 The etiology is unclear, but a spinal cord pathology is possible. I will work up as below.   PLAN: -Blood work: B1, copper , vit E, IFE -Get records: EMG/NMUS, or  skin biopsy from [facility] -MRI cervical and thoracic spine w/wo contrast -Will consider skin biopsy if other work up is negative and this has not been done previously For neuropathy: -Continue Cymbalta  60 mg daily -Increase: Gabapentin  400 mg TID -Lidocaine  cream PRN -Alpha lipoic acid 600 mg once or twice daily  Since their last visit: I got records from [facility] EMG and NMUS (right leg and bilateral arms). 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 I sent patient a message recommending wrist braces. MRI thoracic spine was unremarkable. 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 To be safe, I recommended patient see spine. ***  Patient had increased pain, so I increased gabapentin  to 400 mg TID on 05/26/23.   ***No show on 07/31/23  ROS: Pertinent positive and negative systems reviewed in HPI. ***   MEDICATIONS:  Outpatient Encounter Medications as of 12/18/2023  Medication Sig   albuterol  (VENTOLIN  HFA) 108 (90 Base) MCG/ACT inhaler Inhale 2 puffs into the lungs every 6 (six) hours as needed for wheezing or shortness of breath.   cyclobenzaprine  (FLEXERIL ) 10 MG tablet Take 1 tablet (10 mg total) by mouth at bedtime.   diclofenac  (VOLTAREN ) 75 MG EC tablet Take 1 tablet (75 mg total) by mouth 2 (two) times daily.   DULoxetine  (CYMBALTA ) 60 MG capsule Take 1 capsule by mouth once daily   fluticasone  (FLONASE ) 50 MCG/ACT nasal spray Place 1 spray into both nostrils daily as needed for allergies or rhinitis.   folic acid (FOLVITE) 400 MCG tablet Take 400 mcg by mouth.   gabapentin  (NEURONTIN ) 300 MG capsule TAKE 2 CAPSULES BY MOUTH THREE TIMES DAILY   hydrOXYzine  (VISTARIL ) 25 MG capsule TAKE 1 CAPSULE BY MOUTH TWICE DAILY AS NEEDED   meloxicam  (MOBIC ) 15 MG tablet Take 15 mg by mouth.   metFORMIN  (GLUCOPHAGE ) 500 MG tablet Take 1 tablet (500 mg total) by mouth daily with  breakfast.   mupirocin  ointment (BACTROBAN ) 2 % Apply 1 application. topically 2 (two) times daily.   ondansetron  (ZOFRAN ) 4 MG tablet Take 1 tablet (4 mg total) by mouth every 6 (six) hours.   pregabalin (LYRICA) 50 MG capsule Take 50 mg by mouth.   spironolactone  (ALDACTONE ) 100 MG tablet Take 1 tablet (100 mg total) by mouth daily.   No facility-administered encounter medications on file as of 12/18/2023.    PAST MEDICAL HISTORY: Past Medical History:  Diagnosis Date   Abnormal uterine bleeding (AUB)    Anxiety    Pre-diabetes     PAST SURGICAL HISTORY: Past Surgical History:  Procedure Laterality Date   CESAREAN SECTION  2004   CHOLECYSTECTOMY  2004   LAPAROSCOPIC UNILATERAL SALPINGO OOPHERECTOMY Right 11/15/2019   Procedure: LAPAROSCOPIC RIGHT SALPINGO OOPHORECTOMY AND RIGHT OVARIAN CYSTECTOMY;  Surgeon: Eveline Lynwood MATSU, MD;  Location: [facility];  Service: Gynecology;  Laterality: Right;    ALLERGIES: Allergies  Allergen Reactions   Dilaudid  [Hydromorphone ] Itching   Codeine Hives   Retin-A  [Tretinoin ] Other (See Comments)    Pt reports burning   Buspar  [Buspirone ]  Made her feel more depress   Tape Rash    FAMILY HISTORY: Family History  Problem Relation Age of Onset   Breast cancer Mother        33s    SOCIAL HISTORY: Social History   Tobacco Use   Smoking status: Every Day    Current packs/day: 0.50    Types: Cigarettes   Smokeless tobacco: Never   Tobacco comments:    2-3 cig a day  Vaping Use   Vaping status: Never Used  Substance Use Topics   Alcohol use: Not Currently   Drug use: Not Currently   Social History   Social History Narrative   Are you right handed or left handed? Left   Are you currently employed ?    What is your current occupation? none   Do you live at home alone?   Who lives with you? husband   What type of home do you live in: 1 story or 2 story? one   Caffiene 2-3 Red Bulls     Objective:  Vital  Signs:  There were no vitals taken for this visit.  General:*** General appearance: Awake and alert. No distress. Cooperative with exam.  Skin: No obvious rash or jaundice. HEENT: Atraumatic. Anicteric. Lungs: Non-labored breathing on room air  Heart: Regular Abdomen: Soft, non tender. Extremities: No edema. No obvious deformity.  Musculoskeletal: No obvious joint swelling.  Neurological: Mental Status: Alert. Speech fluent. No pseudobulbar affect Cranial Nerves: CNII: No RAPD. Visual fields intact. CNIII, IV, VI: PERRL. No nystagmus. EOMI. CN V: Facial sensation intact bilaterally to fine touch. [place] clench strong. Jaw jerk***. CN VII: Facial muscles symmetric and strong. No ptosis at rest or after sustained upgaze***. CN VIII: Hears finger rub well bilaterally. CN IX: No hypophonia. CN X: Palate elevates symmetrically. CN XI: Full strength shoulder shrug bilaterally. CN XII: Tongue protrusion full and midline. No atrophy or fasciculations. No significant dysarthria*** Motor: Tone is ***. *** fasciculations in *** extremities. *** atrophy. No grip or percussive myotonia.  Individual muscle group testing (MRC grade out of 5):  Movement     Neck flexion ***    Neck extension ***     Right Left   Shoulder abduction *** ***   Shoulder adduction *** ***   Shoulder ext rotation *** ***   Shoulder int rotation *** ***   Elbow flexion *** ***   Elbow extension *** ***   Wrist extension *** ***   Wrist flexion *** ***   Finger abduction - FDI *** ***   Finger abduction - ADM *** ***   Finger extension *** ***   Finger distal flexion - 2/3 *** ***   Finger distal flexion - 4/5 *** ***   Thumb flexion - FPL *** ***   Thumb abduction - APB *** ***    Hip flexion *** ***   Hip extension *** ***   Hip adduction *** ***   Hip abduction *** ***   Knee extension *** ***   Knee flexion *** ***   Dorsiflexion *** ***   Plantarflexion *** ***   Inversion *** ***    Eversion *** ***   Great toe extension *** ***   Great toe flexion *** ***     Reflexes:  Right Left  Bicep *** ***  Tricep *** ***  BrRad *** ***  Knee *** ***  Ankle *** ***   Pathological Reflexes: Babinski: *** response bilaterally*** Hoffman: *** Troemner: *** Pectoral: *** Palmomental: ***  Facial: *** Midline tap: *** Sensation: Pinprick: *** Vibration: *** Temperature: *** Proprioception: *** Coordination: Intact finger-to- nose-finger and heel-to-shin bilaterally. Romberg negative.*** Gait: Able to rise from chair with arms crossed unassisted. Normal, narrow-based gait. Able to tandem walk. Able to walk on toes and heels.***  Lab and Test Review: New results: 10/16/23: HbA1c: 5.5 CBC unremarkable  Lipid panel (03/30/23): tChol 285, LDL 191, TG 150  01/30/23: B1 wnl Copper  wnl Vit E wnl IFE: no M protein  Lipid panel (03/30/23): tChol 285, LDL 191, TG 150  MRI cervical spine w/wo contrast (04/17/23): IMPRESSION *Multilevel degenerative change is notable for moderate central canal stenosis at C4-5 and C5-6. Also notable is multilevel neural foraminal narrowing which is mostly moderate but severe on the left at C4-5.  MRI thoracic spine w/wo contrast (2.28/25): IMPRESSION: *No significant central canal or neural foraminal narrowing.   EMG and NMUS [facility] - 12/18/2021):   Previously reviewed results: 12/23/22: B12: 650 BMP unremarkable   HbA1c (09/16/22): 5.5 [high of 6.2 in 2022]   TSH (03/12/22) wnl   External labs: 12/06/21: B12: 433 MMA wnl Folate wnl   Imaging: Left ankle xray (09/16/22): FINDINGS: No acute fracture or dislocation. Ankle mortise is preserved. Enthesophyte of the Achilles tendon. No area of erosion or osseous destruction. No unexpected radiopaque foreign body. Soft tissues are unremarkable.   IMPRESSION: Enthesophyte of the Achilles tendon.   Lumbar spine xray (08/07/20): FINDINGS: 11 mm anterolisthesis L5  on S1 with advanced degenerative change at L4-L5 and L5-S1. Chronic bilateral pars defect at L5. Vertebral body heights are normal. Remaining disc spaces are grossly patent. Facet degenerative change of the lower lumbar spine.   IMPRESSION: 1. No acute osseous abnormality. 2. Similar 11 mm anterolisthesis L5 on S1 with bilateral pars defect at L5. Advanced degenerative changes at L4-L5 and L5-S1.  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

## 2023-12-14 NOTE — Progress Notes (Deleted)
 Referring Physician:  Vicci Barnie NOVAK, MD [address] [place],  [place] [address]  Primary Physician:  Heidi Barnie NOVAK, MD  History of Present Illness: 12/14/2023*** [name] has a history of HTN, prediabetes, obesity, mixed hyperlipidemia, history of drug abuse (opioids and heroin- she is currently clean***).    Neck pain? Symptoms since 2017 Numbness and tingling in legs, hands and feet. Numbness in both legs? Feet have burning sensation Balance issues   Previous EMG in 2023 showed right > left carpal tunnel syndrome per Dr. Loralee notes. He recommended bracing at night.   Duration: *** Location: *** Quality: *** Severity: ***  Precipitating: aggravated by *** Modifying factors: made better by *** Weakness: none Timing: ***  Tobacco use: smokes 1/2 PPD x *** years.   Bowel/Bladder Dysfunction: none  Conservative measures:  Physical therapy: *** has not participated in Multimodal medical therapy including regular antiinflammatories: *** Gabapentin , Lyrica, Cymbalta , Flexeril , Voltaren , Meloxicam  Injections: *** no epidural steroid injections  Past Surgery: ***no spine surgery  Heidi Bartlett has ***no symptoms of cervical myelopathy.  The symptoms are causing a significant impact on the patient's life.   Review of Systems:  A 10 point review of systems is negative, except for the pertinent positives and negatives detailed in the HPI.  Past Medical History: Past Medical History:  Diagnosis Date   Abnormal uterine bleeding (AUB)    Anxiety    Pre-diabetes     Past Surgical History: Past Surgical History:  Procedure Laterality Date   CESAREAN SECTION  2004   CHOLECYSTECTOMY  2004   LAPAROSCOPIC UNILATERAL SALPINGO OOPHERECTOMY Right 11/15/2019   Procedure: LAPAROSCOPIC RIGHT SALPINGO OOPHORECTOMY AND RIGHT OVARIAN CYSTECTOMY;  Surgeon: Heidi Lynwood MATSU, MD;  Location: [facility];  Service: Gynecology;   Laterality: Right;    Allergies: Allergies as of 12/16/2023 - Review Complete 11/26/2023  Allergen Reaction Noted   Dilaudid  [hydromorphone ] Itching 11/18/2019   Codeine Hives 07/02/2018   Retin-a  [tretinoin ] Other (See Comments) 04/23/2021   Buspar  [buspirone ]  09/16/2022   Tape Rash 11/18/2019    Medications: Outpatient Encounter Medications as of 12/16/2023  Medication Sig   albuterol  (VENTOLIN  HFA) 108 (90 Base) MCG/ACT inhaler Inhale 2 puffs into the lungs every 6 (six) hours as needed for wheezing or shortness of breath.   cyclobenzaprine  (FLEXERIL ) 10 MG tablet Take 1 tablet (10 mg total) by mouth at bedtime.   diclofenac  (VOLTAREN ) 75 MG EC tablet Take 1 tablet (75 mg total) by mouth 2 (two) times daily.   DULoxetine  (CYMBALTA ) 60 MG capsule Take 1 capsule by mouth once daily   fluticasone  (FLONASE ) 50 MCG/ACT nasal spray Place 1 spray into both nostrils daily as needed for allergies or rhinitis.   folic acid (FOLVITE) 400 MCG tablet Take 400 mcg by mouth.   gabapentin  (NEURONTIN ) 300 MG capsule TAKE 2 CAPSULES BY MOUTH THREE TIMES DAILY   hydrOXYzine  (VISTARIL ) 25 MG capsule TAKE 1 CAPSULE BY MOUTH TWICE DAILY AS NEEDED   meloxicam  (MOBIC ) 15 MG tablet Take 15 mg by mouth.   metFORMIN  (GLUCOPHAGE ) 500 MG tablet Take 1 tablet (500 mg total) by mouth daily with breakfast.   mupirocin  ointment (BACTROBAN ) 2 % Apply 1 application. topically 2 (two) times daily.   ondansetron  (ZOFRAN ) 4 MG tablet Take 1 tablet (4 mg total) by mouth every 6 (six) hours.   pregabalin (LYRICA) 50 MG capsule Take 50 mg by mouth.   spironolactone  (ALDACTONE ) 100 MG tablet Take 1  tablet (100 mg total) by mouth daily.   No facility-administered encounter medications on file as of 12/16/2023.    Social History: Social History   Tobacco Use   Smoking status: Every Day    Current packs/day: 0.50    Types: Cigarettes   Smokeless tobacco: Never   Tobacco comments:    2-3 cig a day  Vaping Use    Vaping status: Never Used  Substance Use Topics   Alcohol use: Not Currently   Drug use: Not Currently    Family Medical History: Family History  Problem Relation Age of Onset   Breast cancer Mother        30s    Physical Examination: There were no vitals filed for this visit.  General: Patient is well developed, well nourished, calm, collected, and in no apparent distress. Attention to examination is appropriate.  Respiratory: Patient is breathing without any difficulty.   NEUROLOGICAL:     Awake, alert, oriented to person, place, and time.  Speech is clear and fluent. Fund of knowledge is appropriate.   Cranial Nerves: Pupils equal round and reactive to light.  Facial tone is symmetric.    *** ROM of cervical spine *** pain *** posterior cervical tenderness. *** tenderness in bilateral trapezial region.   *** ROM of lumbar spine *** pain *** posterior lumbar tenderness.   No abnormal lesions on exposed skin.   Strength: Side Biceps Triceps Deltoid Interossei Grip Wrist Ext. Wrist Flex.  R 5 5 5 5 5 5 5   L 5 5 5 5 5 5 5    Side Iliopsoas Quads Hamstring PF DF EHL  R 5 5 5 5 5 5   L 5 5 5 5 5 5    Reflexes are ***2+ and symmetric at the biceps, brachioradialis, patella and achilles.   Hoffman's is absent.  Clonus is not present.   Bilateral upper and lower extremity sensation is intact to light touch.     Gait is normal.   ***No difficulty with tandem gait.    Medical Decision Making  Imaging: Cervical MRI dated 04/17/23:  FINDINGS: Straightening of the normal cervical lordosis could relate to muscle spasm or could be positional.   No prevertebral soft tissue swelling.   No significant listhesis.   No significant vertebral body height loss.   Multilevel disc space narrowing is moderate at C4-5 and C5-6.   Cervical spinal cord is normal in signal intensity.   Heterogeneous bone marrow signal with multilevel bone marrow endplate edematous change.   No  suspicious intracanalicular enhancement.   Imaging to the visualized portions of the brain parenchyma reveals no acute finding.   C2-3: No significant central canal narrowing. Moderate left neural foraminal narrowing mostly from disc osteophyte. No significant right neural foraminal narrowing.   C3-4: Mild central canal stenosis from disc osteophyte complex. Moderate bilateral neural foraminal narrowing from disc osteophyte and uncovertebral hypertrophy.   C4-5: Moderate central canal stenosis from disc osteophyte complex. Moderate right and severe left neural foraminal narrowing mostly from disc osteophyte.   C5-6: Moderate narrowing of the left aspect of the central canal from disc osteophyte complex with superimposed left paracentral disc protrusion. Moderate left without significant right neural foraminal narrowing mostly from disc osteophyte.   C6-7: Mild central canal stenosis from disc osteophyte complex. Mild bilateral neural foramen narrowing is more pronounced on the right related to disc osteophyte.   C7-T1: No significant central canal narrowing. No significant neural foraminal narrowing.   IMPRESSION: IMPRESSION *Multilevel degenerative  change is notable for moderate central canal stenosis at C4-5 and C5-6. Also notable is multilevel neural foraminal narrowing which is mostly moderate but severe on the left at C4-5. *Please see above for more details.     Electronically Signed   By: Bryon  Nastasi M.D.   On: 05/09/2023 10:22  I have personally reviewed the images and agree with the above interpretation.  Assessment and Plan: Ms. Lefferts is a pleasant 43 y.o. female has ***  Treatment options discussed with patient and following plan made:   - Order for physical therapy for *** spine ***. Patient to call to schedule appointment. *** - Continue current medications including ***. Reviewed dosing and side effects.  - Prescription for ***. Reviewed dosing and  side effects. Take with food.  - Prescription for *** to take prn muscle spasms. Reviewed dosing and side effects. Discussed this can cause drowsiness.  - MRI of *** to further evaluate *** radiculopathy. No improvement time or medications (***).  - Referral to PMR at [facility] to discuss possible *** injections.  - Will schedule phone visit to review MRI results once I get them back.   I spent a total of *** minutes in face-to-face and non-face-to-face activities related to this patient's care today including review of outside records, review of imaging, review of symptoms, physical exam, discussion of differential diagnosis, discussion of treatment options, and documentation.   Thank you for involving me in the care of this patient.   Glade Boys PA-C Dept. of Neurosurgery

## 2023-12-16 ENCOUNTER — Ambulatory Visit: Admitting: Orthopedic Surgery

## 2023-12-18 ENCOUNTER — Encounter: Payer: Self-pay | Admitting: Neurology

## 2023-12-18 ENCOUNTER — Ambulatory Visit: Admitting: Neurology

## 2023-12-19 ENCOUNTER — Other Ambulatory Visit: Payer: Self-pay | Admitting: Neurology

## 2023-12-19 DIAGNOSIS — G629 Polyneuropathy, unspecified: Secondary | ICD-10-CM

## 2023-12-23 ENCOUNTER — Other Ambulatory Visit: Payer: Self-pay | Admitting: Internal Medicine

## 2023-12-23 DIAGNOSIS — G629 Polyneuropathy, unspecified: Secondary | ICD-10-CM

## 2023-12-23 NOTE — Telephone Encounter (Signed)
 Copied from CRM [id number]. Topic: Clinical - Medication Refill >> Dec 23, 2023  1:25 PM Rachelle R wrote: Medication: gabapentin  (NEURONTIN ) 300 MG capsule (States Dr Vicci originally prescribed, Dr Leigh just changed the dosage.)  Has the patient contacted their pharmacy? Yes, call Dr  This is the patient's preferred pharmacy:  [id number] - [place]), [place] - [address] [place] ([place]) [place] [address] Phone: [phone] Fax: [phone]  Is this the correct pharmacy for this prescription? Yes If no, delete pharmacy and type the correct one.   Has the prescription been filled recently? No  Is the patient out of the medication? Yes  Has the patient been seen for an appointment in the last year OR does the patient have an upcoming appointment? Yes  Can we respond through MyChart? Yes  Agent: Please be advised that Rx refills may take up to 3 business days. We ask that you follow-up with your pharmacy.

## 2023-12-24 NOTE — Telephone Encounter (Signed)
 Requested medication (s) are due for refill today: yes  Requested medication (s) are on the active medication list: yes  Last refill:  11/17/23  Future visit scheduled: yes  Notes to clinic:  Unable to refill per protocol, last refill by another provider.      Requested Prescriptions  Pending Prescriptions Disp Refills   gabapentin  (NEURONTIN ) 300 MG capsule 180 capsule 0    Sig: Take 2 capsules (600 mg total) by mouth 3 (three) times daily.     Neurology: Anticonvulsants - gabapentin  Failed - 12/24/2023  4:27 PM      Failed - Cr in normal range and within 360 days    Creat  Date Value Ref Range Status  09/09/2021 0.76 0.50 - 0.99 mg/dL Final   Creatinine, Ser  Date Value Ref Range Status  12/23/2022 0.75 0.57 - 1.00 mg/dL Final         Passed - Completed PHQ-2 or PHQ-9 in the last 360 days      Passed - Valid encounter within last 12 months    Recent Outpatient Visits           2 months ago Essential hypertension   [facility] [place] Vicci Barnie NOVAK, MD   8 months ago Essential hypertension   [facility] [place] Vicci Barnie NOVAK, MD   1 year ago Prediabetes   [facility] [place] Delbert Clam, MD   1 year ago Acute non-recurrent maxillary sinusitis   [facility] [place] Brien Belvie BRAVO, MD   1 year ago Small fiber neuropathy   [facility] [place] Vicci Barnie NOVAK, MD

## 2023-12-25 ENCOUNTER — Encounter: Payer: Self-pay | Admitting: Internal Medicine

## 2023-12-26 MED ORDER — GABAPENTIN 300 MG PO CAPS: 600.0000 mg | ORAL_CAPSULE | Freq: Three times a day (TID) | ORAL | 4 refills | Status: DC

## 2023-12-28 ENCOUNTER — Telehealth: Payer: Self-pay | Admitting: Internal Medicine

## 2023-12-28 NOTE — Telephone Encounter (Signed)
 Copied from CRM [id number]. Topic: Clinical - Medical Advice >> Dec 28, 2023 11:27 AM Heidi Bartlett wrote:  Reason for CRM: Pt received a notice in the mail stating that she needs to have a cervical cancer screening done and would like to know where she would get this done.    Pls follow up with pt to discuss her options.

## 2023-12-28 NOTE — Telephone Encounter (Signed)
 Last pap was 04/2019. Due again in 5 yrs which will be 04/2024.

## 2023-12-30 NOTE — Telephone Encounter (Signed)
 Patient aware and scheduled for 05/12/2024 at 0850.

## 2024-03-17 ENCOUNTER — Ambulatory Visit: Attending: Internal Medicine | Admitting: Internal Medicine

## 2024-03-17 ENCOUNTER — Encounter: Payer: Self-pay | Admitting: Internal Medicine

## 2024-03-17 VITALS — BP 127/82 | HR 76 | Ht 68.0 in | Wt 203.0 lb

## 2024-03-17 DIAGNOSIS — F411 Generalized anxiety disorder: Secondary | ICD-10-CM | POA: Diagnosis not present

## 2024-03-17 DIAGNOSIS — Z1231 Encounter for screening mammogram for malignant neoplasm of breast: Secondary | ICD-10-CM

## 2024-03-17 DIAGNOSIS — F330 Major depressive disorder, recurrent, mild: Secondary | ICD-10-CM | POA: Diagnosis not present

## 2024-03-17 DIAGNOSIS — G629 Polyneuropathy, unspecified: Secondary | ICD-10-CM | POA: Diagnosis not present

## 2024-03-17 DIAGNOSIS — F17200 Nicotine dependence, unspecified, uncomplicated: Secondary | ICD-10-CM

## 2024-03-17 DIAGNOSIS — E66811 Obesity, class 1: Secondary | ICD-10-CM | POA: Diagnosis not present

## 2024-03-17 DIAGNOSIS — L659 Nonscarring hair loss, unspecified: Secondary | ICD-10-CM

## 2024-03-17 DIAGNOSIS — R7303 Prediabetes: Secondary | ICD-10-CM

## 2024-03-17 DIAGNOSIS — E6609 Other obesity due to excess calories: Secondary | ICD-10-CM | POA: Diagnosis not present

## 2024-03-17 DIAGNOSIS — I10 Essential (primary) hypertension: Secondary | ICD-10-CM | POA: Diagnosis not present

## 2024-03-17 DIAGNOSIS — Z6830 Body mass index (BMI) 30.0-30.9, adult: Secondary | ICD-10-CM | POA: Diagnosis not present

## 2024-03-17 MED ORDER — SPIRONOLACTONE 100 MG PO TABS: 100.0000 mg | ORAL_TABLET | Freq: Every day | ORAL | 1 refills | Status: AC

## 2024-03-17 MED ORDER — GABAPENTIN 300 MG PO CAPS: ORAL_CAPSULE | ORAL | 6 refills | Status: AC

## 2024-03-17 MED ORDER — METFORMIN HCL 500 MG PO TABS: 500.0000 mg | ORAL_TABLET | Freq: Every day | ORAL | 1 refills | Status: AC

## 2024-03-17 NOTE — Progress Notes (Signed)
 "   Patient ID: Heidi Bartlett, female    DOB: [birth year]  MRN: [id number]  CC: Hypertension (HTN f/u. Axel returned on RT side of scalp growing in size - noticed 1 mo ago/Requesting gabapentin  dosage increase - not feeling enough relief /Already received flu vax)   Subjective: Heidi Bartlett is a 44 y.o. female who presents for chronic ds management. Her chronic medical issues include:  History of HTN, obesity/preDM, hep C treated,  tob dep, chronic right-sided lower back pain, DUB/PCOS, preDM, GAD/MDD, small fiber neuropathy ([facility] neurology fall 2023).   Discussed the use of AI scribe software for clinical note transcription with the patient, who gave verbal consent to proceed.  History of Present Illness Heidi Bartlett is a 44 year old female with hypertension, depression, and neuropathy who presents with follow-up of her chronic medical conditions and new onset hair loss.  She has been experiencing hair loss on the right side of her scalp for the past month, noting a bald spot covered by her top hair. She is not sure of any contributing factors except stress. She denies pulling her hair out. A similar episode occurred several yrs ago in a different location on her scalp, treated with fourteen injections by a dermatologist. No recent changes in hair products, but she used a wash-out dye a couple of weeks ago. Significant shedding occurs when brushing or combing her hair, which has increased recently.  She has small fiber neuropathy and has been taking gabapentin  600 mg TID. The medication has become less effective recently, with increased tingling and numbness in her feet, particularly at night, disrupting her sleep. Her feet feel cold, and she sometimes needs to walk around to alleviate the irritation. She also takes duloxetine , which helps with her anxiety more than the neuropathy symptoms.  HTN: She is on spironolactone  100 mg daily for hypertension. She checks her blood  pressure occasionally at [facility], with a reading of 124/87 on January 11th. She limits her salt intake and consumes fresh fruits and vegetables daily.   Tob: She is not ready to quit smoking yet.  PreDM/Obesity: She is taking low-dose metformin  for prediabetes, with her last A1c recorded at 5.5 in August of the previous year. She prefers to continue the medication despite being out of the prediabetes range. She reports maintaining her weight and plans to increase physical activity when the weather is warmer. Currently, she does minimal exercise due to feeling cold.  She completed the PHQ-9 and GAD-7 screenings; both positive. Doing well on Cymbalta . She does not see a therapist and does not feel the need for one.    Patient Active Problem List   Diagnosis Date Noted   Acute non-recurrent maxillary sinusitis 10/30/2022   Non-recurrent acute suppurative otitis media of left ear without spontaneous rupture of tympanic membrane 10/30/2022   Folliculitis barbae traumatica 10/30/2022   Mixed hyperlipidemia 05/02/2021   Elevated liver enzymes 05/02/2021   Essential hypertension 09/14/2020   Prediabetes 01/03/2020   Obesity (BMI 30-39.9) 01/03/2020   Alopecia 01/03/2020   Right ovarian cyst 10/17/2019   Dysfunctional uterine bleeding 07/02/2018   Sciatica of right side 11/24/2017   Tobacco dependence 11/24/2017     Medications Ordered Prior to Encounter[1]  Allergies[2]  Social History   Socioeconomic History   Marital status: Single    Spouse name: Not on file   Number of children: 0   Years of education: 9th grade   Highest education level: 10th grade  Occupational History  Occupation: unemployed  Tobacco Use   Smoking status: Every Day    Current packs/day: 0.50    Types: Cigarettes   Smokeless tobacco: Never   Tobacco comments:    2-3 cig a day  Vaping Use   Vaping status: Never Used  Substance and Sexual Activity   Alcohol use: Not Currently   Drug use: Not Currently    Sexual activity: Yes    Birth control/protection: None  Other Topics Concern   Not on file  Social History Narrative   Are you right handed or left handed? Left   Are you currently employed ?    What is your current occupation? none   Do you live at home alone?   Who lives with you? husband   What type of home do you live in: 1 story or 2 story? one   Caffiene 2-3 Red Bulls    Social Drivers of Health   Tobacco Use: High Risk (03/17/2024)   Patient History    Smoking Tobacco Use: Every Day    Smokeless Tobacco Use: Never    Passive Exposure: Not on file  Financial Resource Strain: Low Risk (03/11/2024)   Overall Financial Resource Strain (CARDIA)    Difficulty of Paying Living Expenses: Not very hard  Food Insecurity: Food Insecurity Present (03/11/2024)   Epic    Worried About Programme Researcher, Broadcasting/film/video in the Last Year: Sometimes true    Ran Out of Food in the Last Year: Sometimes true  Transportation Needs: Unmet Transportation Needs (03/11/2024)   Epic    Lack of Transportation (Medical): Yes    Lack of Transportation (Non-Medical): Yes  Physical Activity: Insufficiently Active (03/11/2024)   Exercise Vital Sign    Days of Exercise per Week: 4 days    Minutes of Exercise per Session: 20 min  Stress: Stress Concern Present (03/11/2024)   [employer] of Occupational Health - Occupational Stress Questionnaire    Feeling of Stress: To some extent  Social Connections: Moderately Isolated (03/11/2024)   Social Connection and Isolation Panel    Frequency of Communication with Friends and Family: More than three times a week    Frequency of Social Gatherings with Friends and Family: Once a week    Attends Religious Services: Never    Database Administrator or Organizations: No    Attends Engineer, Structural: Not on file    Marital Status: Married  Catering Manager Violence: Not At Risk (12/23/2022)   Humiliation, Afraid, Rape, and Kick questionnaire    Fear of Current  or Ex-Partner: No    Emotionally Abused: No    Physically Abused: No    Sexually Abused: No  Depression (PHQ2-9): Medium Risk (03/17/2024)   Depression (PHQ2-9)    PHQ-2 Score: 8  Alcohol Screen: Low Risk (12/23/2022)   Alcohol Screen    Last Alcohol Screening Score (AUDIT): 0  Housing: Unknown (03/11/2024)   Epic    Unable to Pay for Housing in the Last Year: No    Number of Times Moved in the Last Year: Not on file    Homeless in the Last Year: No  Utilities: Not At Risk (12/23/2022)   [facility] Utilities    Threatened with loss of utilities: No  Health Literacy: Adequate Health Literacy (12/23/2022)   B1300 Health Literacy    Frequency of need for help with medical instructions: Never    Family History  Problem Relation Age of Onset   Breast cancer Mother  30s    Past Surgical History:  Procedure Laterality Date   CESAREAN SECTION  2004   CHOLECYSTECTOMY  2004   LAPAROSCOPIC UNILATERAL SALPINGO OOPHERECTOMY Right 11/15/2019   Procedure: LAPAROSCOPIC RIGHT SALPINGO OOPHORECTOMY AND RIGHT OVARIAN CYSTECTOMY;  Surgeon: Eveline Lynwood MATSU, MD;  Location: [facility];  Service: Gynecology;  Laterality: Right;    ROS: Review of Systems Negative except as stated above  PHYSICAL EXAM: BP 127/82   Pulse 76   Ht 5' 8 (1.727 m)   Wt 203 lb (92.1 kg)   SpO2 100%   BMI 30.87 kg/m   Wt Readings from Last 3 Encounters:  03/17/24 203 lb (92.1 kg)  10/16/23 200 lb (90.7 kg)  07/01/23 209 lb (94.8 kg)    Physical Exam  General appearance - alert, well appearing, middle age caucasian FM and in no distress Mental status - normal mood, behavior, speech, dress, motor activity, and thought processes Neck - supple, no significant adenopathy Chest - clear to auscultation, no wheezes, rales or rhonchi, symmetric air entry Heart - normal rate, regular rhythm, normal S1, S2, no murmurs, rubs, clicks or gallops Extremities - toes cold to touch but rest feet and lower legs  warm. DP/Posterior tibialis and popliteal pulses 3+ BL. No cyanosis Hair 4x3 cm area of alopecia in RT parietal area. No shedding when a pulled on hair shaft in different areas.      03/17/2024   10:52 AM 10/16/2023    2:07 PM 03/30/2023    9:15 AM  Depression screen PHQ 2/9  Decreased Interest 1 0 1  Down, Depressed, Hopeless 1 0 0  PHQ - 2 Score 2 0 1  Altered sleeping 1 1 1   Tired, decreased energy 1 1 1   Change in appetite 1 1 1   Feeling bad or failure about yourself  1 0 0  Trouble concentrating 1 0 0  Moving slowly or fidgety/restless 1 0 0  Suicidal thoughts 0 0 0  PHQ-9 Score 8 3  4    Difficult doing work/chores Somewhat difficult Somewhat difficult Somewhat difficult     Data saved with a previous flowsheet row definition      03/17/2024   10:52 AM 10/16/2023    2:08 PM 03/30/2023    9:15 AM 10/28/2022    3:44 PM  GAD 7 : Generalized Anxiety Score  Nervous, Anxious, on Edge 1 1  1   0   Control/stop worrying 1 1  0  0   Worry too much - different things 1 1  0  0   Trouble relaxing 1 1  1   0   Restless 1 1  1   0   Easily annoyed or irritable 1 1  1   0   Afraid - awful might happen 1 1  0  0   Total GAD 7 Score 7 7 4  0  Anxiety Difficulty Somewhat difficult Somewhat difficult Somewhat difficult      Data saved with a previous flowsheet row definition        Latest Ref Rng & Units 03/30/2023   10:26 AM 12/23/2022    4:33 PM 03/12/2022   10:17 AM  CMP  Glucose 70 - 99 mg/dL  76  95   BUN 6 - 24 mg/dL  7  11   Creatinine 9.42 - 1.00 mg/dL  9.24  9.28   Sodium 865 - 144 mmol/L  143  145   Potassium 3.5 - 5.2 mmol/L  4.9  4.3  Chloride 96 - 106 mmol/L  105  106   CO2 20 - 29 mmol/L  28  22   Calcium  8.7 - 10.2 mg/dL  9.4  9.1   Total Protein 6.0 - 8.5 g/dL 7.8     Total Bilirubin 0.0 - 1.2 mg/dL 0.3     Alkaline Phos 44 - 121 IU/L 75     AST 0 - 40 IU/L 17     ALT 0 - 32 IU/L 14      Lipid Panel     Component Value Date/Time   CHOL 285 (H) 03/30/2023 1026    TRIG 150 (H) 03/30/2023 1026   HDL 67 03/30/2023 1026   CHOLHDL 4.3 03/30/2023 1026   LDLCALC 191 (H) 03/30/2023 1026    CBC    Component Value Date/Time   WBC 10.8 10/16/2023 1509   WBC 8.5 12/02/2021 0922   RBC 4.99 10/16/2023 1509   RBC 4.61 12/02/2021 0922   HGB 14.7 10/16/2023 1509   HCT 44.9 10/16/2023 1509   PLT 216 10/16/2023 1509   MCV 90 10/16/2023 1509   MCH 29.5 10/16/2023 1509   MCH 30.2 12/02/2021 0922   MCHC 32.7 10/16/2023 1509   MCHC 34.1 12/02/2021 0922   RDW 13.0 10/16/2023 1509   LYMPHSABS 2.5 03/12/2022 1017   MONOABS 0.5 01/10/2017 0929   EOSABS 0.3 03/12/2022 1017   BASOSABS 0.1 03/12/2022 1017    ASSESSMENT AND PLAN: 1. Essential hypertension (Primary) Close to goal.  We discussed adding another medication to get her diastolic blood pressure to goal but patient declines at this time.  She will try to monitor blood pressure at least [facility] every 2 weeks at [facility].  Advised that the goal is 130/80 or lower. - spironolactone  (ALDACTONE ) 100 MG tablet; Take 1 tablet (100 mg total) by mouth daily.  Dispense: 90 tablet; Refill: 1  2. Alopecia - Ambulatory referral to Dermatology  3. Hair loss - Ambulatory referral to Dermatology - TSH+T4F+T3Free  4. Small fiber neuropathy Patient with some reemergence of symptoms particularly at night.  We agreed to increase nighttime gabapentin  dose to 900 mg.  Otherwise she will continue 600 mg in the morning and in the afternoon - gabapentin  (NEURONTIN ) 300 MG capsule; 2 tabs PO Q a.m and afternoon and 3 tabs bedtime  Dispense: 210 capsule; Refill: 6  5. Class 1 obesity due to excess calories with serious comorbidity and body mass index (BMI) of 30.0 to 30.9 in adult Encouraged her to continue healthy eating habits. Discussed ways she can exercise indoors during cold weather like marching in place for 30 minutes while watching a program. - metFORMIN  (GLUCOPHAGE ) 500 MG tablet; Take 1 tablet (500 mg total) by mouth  daily with breakfast.  Dispense: 90 tablet; Refill: 1  6. Prediabetes Patient wants to continue metformin . - metFORMIN  (GLUCOPHAGE ) 500 MG tablet; Take 1 tablet (500 mg total) by mouth daily with breakfast.  Dispense: 90 tablet; Refill: 1  7. Tobacco dependence Strongly encouraged her to quit.  She is not ready to give a trial of quitting.  8. Major depressive disorder, recurrent episode, mild 9. GAD (generalized anxiety disorder) Patient feels she is doing okay on Cymbalta .  Does not feel she needs referral to behavioral health specialist at this time.  10. Encounter for screening mammogram for malignant neoplasm of breast - MM 3D SCREENING MAMMOGRAM BILATERAL BREAST; Future    Patient was given the opportunity to ask questions.  Patient verbalized understanding of the plan and  was able to repeat key elements of the plan.   This documentation was completed using Paediatric nurse.  Any transcriptional errors are unintentional.  Orders Placed This Encounter  Procedures   MM 3D SCREENING MAMMOGRAM BILATERAL BREAST   TSH+T4F+T3Free   Ambulatory referral to Dermatology     Requested Prescriptions   Signed Prescriptions Disp Refills   spironolactone  (ALDACTONE ) 100 MG tablet 90 tablet 1    Sig: Take 1 tablet (100 mg total) by mouth daily.   metFORMIN  (GLUCOPHAGE ) 500 MG tablet 90 tablet 1    Sig: Take 1 tablet (500 mg total) by mouth daily with breakfast.   gabapentin  (NEURONTIN ) 300 MG capsule 210 capsule 6    Sig: 2 tabs PO Q a.m and afternoon and 3 tabs bedtime    Return in about 2 months (around 05/15/2024) for PAP.  Barnie Louder, MD, FACP     [1]  Current Outpatient Medications on File Prior to Visit  Medication Sig Dispense Refill   albuterol  (VENTOLIN  HFA) 108 (90 Base) MCG/ACT inhaler Inhale 2 puffs into the lungs every 6 (six) hours as needed for wheezing or shortness of breath. 6.7 g 0   cyclobenzaprine  (FLEXERIL ) 10 MG tablet Take 1 tablet  (10 mg total) by mouth at bedtime. 90 tablet 1   diclofenac  (VOLTAREN ) 75 MG EC tablet Take 1 tablet (75 mg total) by mouth 2 (two) times daily. 50 tablet 2   DULoxetine  (CYMBALTA ) 60 MG capsule Take 1 capsule by mouth once daily 90 capsule 1   fluticasone  (FLONASE ) 50 MCG/ACT nasal spray Place 1 spray into both nostrils daily as needed for allergies or rhinitis. 16 g 1   folic acid (FOLVITE) 400 MCG tablet Take 400 mcg by mouth.     hydrOXYzine  (VISTARIL ) 25 MG capsule TAKE 1 CAPSULE BY MOUTH TWICE DAILY AS NEEDED 180 capsule 0   meloxicam  (MOBIC ) 15 MG tablet Take 15 mg by mouth.     ondansetron  (ZOFRAN ) 4 MG tablet Take 1 tablet (4 mg total) by mouth every 6 (six) hours. 12 tablet 0   mupirocin  ointment (BACTROBAN ) 2 % Apply 1 application. topically 2 (two) times daily. 22 g 0   No current facility-administered medications on file prior to visit.  [2]  Allergies Allergen Reactions   Dilaudid  [Hydromorphone ] Itching   Codeine Hives   Retin-A  [Tretinoin ] Other (See Comments)    Pt reports burning   Buspar  [Buspirone ]     Made her feel more depress   Tape Rash   "

## 2024-03-17 NOTE — Patient Instructions (Signed)
" °  VISIT SUMMARY: During your visit, we discussed your ongoing medical conditions, including hypertension, depression, neuropathy, and a new issue with hair loss. We reviewed your current medications and made some adjustments to better manage your symptoms. We also discussed your general health maintenance and the importance of routine screenings.  YOUR PLAN: -ALOPECIA: Alopecia is a condition that causes hair loss. You have a new bald spot, possibly due to stress or hair dye. We have referred you to a dermatologist for further evaluation and management, ordered thyroid function tests, and advised you to avoid using hair dye.  -SMALL FIBER NEUROPATHY: Small fiber neuropathy is a condition that affects the small nerve fibers, causing tingling and numbness. Your gabapentin  dose has been increased to 900 mg at bedtime, and you should continue taking 600 mg in the morning and afternoon.  -ESSENTIAL HYPERTENSION: Hypertension is high blood pressure. Your current treatment with spironolactone  will continue at 100 mg daily. Please monitor your blood pressure biweekly, aiming for a goal of 130/80 or lower.  -PREDIABETES: Prediabetes is a condition where blood sugar levels are higher than normal but not yet high enough to be diagnosed as diabetes. You will continue taking metformin  500 mg daily, and a refill has been sent for your medication.  -CLASS 1 OBESITY: Class 1 obesity is a condition where your body mass index (BMI) is between 30.0 and 30.9. We encourage you to engage in indoor exercises to help manage your weight.  -MAJOR DEPRESSIVE DISORDER AND GENERALIZED ANXIETY DISORDER: Major depressive disorder and generalized anxiety disorder are mental health conditions that affect your mood and anxiety levels. You will continue taking duloxetine  60 mg daily to manage your symptoms.  -TOBACCO DEPENDENCE: Tobacco dependence is an addiction to tobacco products. Although you are not ready to quit smoking yet, we  offer support and resources for smoking cessation whenever you are ready.  -GENERAL HEALTH MAINTENANCE: You are due for a mammogram and Pap smear. A referral for a mammogram has been submitted, and your Pap smear is scheduled for the spring.  INSTRUCTIONS: Please follow up with the dermatologist for your alopecia and complete the thyroid function tests as ordered. Monitor your blood pressure biweekly and aim for a goal of 130/80 or lower. Continue taking your medications as prescribed and engage in indoor exercises to manage your weight. If you decide to quit smoking, we are here to support you. Ensure you complete your mammogram and Pap smear as scheduled.    Contains text generated by [employer].   "

## 2024-03-18 ENCOUNTER — Ambulatory Visit: Payer: Self-pay | Admitting: Internal Medicine

## 2024-03-18 LAB — TSH+T4F+T3FREE
Free T4: 1.36 ng/dL (ref 0.82–1.77)
T3, Free: 3.5 pg/mL (ref 2.0–4.4)
TSH: 1.96 u[IU]/mL (ref 0.450–4.500)

## 2024-04-06 ENCOUNTER — Ambulatory Visit

## 2024-05-06 ENCOUNTER — Ambulatory Visit: Admitting: Neurology

## 2024-05-12 ENCOUNTER — Ambulatory Visit: Admitting: Internal Medicine

## 2024-05-16 ENCOUNTER — Ambulatory Visit: Payer: Self-pay | Admitting: Internal Medicine
